# Patient Record
Sex: Male | Born: 1955
Health system: Southern US, Community
[De-identification: ages and names within clinical notes are randomized; demographics above are authoritative.]

## PROBLEM LIST (undated history)

## (undated) DIAGNOSIS — T7840XA Allergy, unspecified, initial encounter: Secondary | ICD-10-CM

## (undated) DIAGNOSIS — M1990 Unspecified osteoarthritis, unspecified site: Secondary | ICD-10-CM

## (undated) DIAGNOSIS — J329 Chronic sinusitis, unspecified: Secondary | ICD-10-CM

## (undated) DIAGNOSIS — F419 Anxiety disorder, unspecified: Secondary | ICD-10-CM

## (undated) DIAGNOSIS — H269 Unspecified cataract: Secondary | ICD-10-CM

## (undated) DIAGNOSIS — Z87442 Personal history of urinary calculi: Secondary | ICD-10-CM

## (undated) DIAGNOSIS — E039 Hypothyroidism, unspecified: Secondary | ICD-10-CM

## (undated) DIAGNOSIS — Z872 Personal history of diseases of the skin and subcutaneous tissue: Secondary | ICD-10-CM

## (undated) DIAGNOSIS — H409 Unspecified glaucoma: Secondary | ICD-10-CM

## (undated) DIAGNOSIS — K219 Gastro-esophageal reflux disease without esophagitis: Secondary | ICD-10-CM

## (undated) DIAGNOSIS — M6208 Separation of muscle (nontraumatic), other site: Secondary | ICD-10-CM

## (undated) DIAGNOSIS — K439 Ventral hernia without obstruction or gangrene: Secondary | ICD-10-CM

## (undated) HISTORY — DX: Separation of muscle (nontraumatic), other site: M62.08

## (undated) HISTORY — DX: Gastro-esophageal reflux disease without esophagitis: K21.9

## (undated) HISTORY — PX: OTHER SURGICAL HISTORY: SHX169

## (undated) HISTORY — DX: Unspecified glaucoma: H40.9

## (undated) HISTORY — DX: Chronic sinusitis, unspecified: J32.9

## (undated) HISTORY — DX: Allergy, unspecified, initial encounter: T78.40XA

## (undated) HISTORY — PX: JOINT REPLACEMENT: SHX530

## (undated) HISTORY — DX: Ventral hernia without obstruction or gangrene: K43.9

## (undated) HISTORY — PX: TONSILLECTOMY AND ADENOIDECTOMY: SHX28

## (undated) HISTORY — DX: Personal history of diseases of the skin and subcutaneous tissue: Z87.2

## (undated) HISTORY — PX: EYE SURGERY: SHX253

## (undated) HISTORY — DX: Personal history of urinary calculi: Z87.442

## (undated) HISTORY — DX: Unspecified cataract: H26.9

## (undated) HISTORY — DX: Hypothyroidism, unspecified: E03.9

## (undated) HISTORY — DX: Anxiety disorder, unspecified: F41.9

---

## 2003-06-19 ENCOUNTER — Encounter: Admission: RE | Admit: 2003-06-19 | Discharge: 2003-06-19 | Payer: Self-pay | Admitting: Internal Medicine

## 2004-12-06 ENCOUNTER — Ambulatory Visit: Payer: Self-pay | Admitting: Internal Medicine

## 2005-01-01 ENCOUNTER — Ambulatory Visit: Payer: Self-pay | Admitting: Cardiology

## 2005-01-01 ENCOUNTER — Ambulatory Visit: Payer: Self-pay | Admitting: Internal Medicine

## 2005-01-23 ENCOUNTER — Ambulatory Visit: Payer: Self-pay | Admitting: Internal Medicine

## 2005-03-24 ENCOUNTER — Ambulatory Visit: Payer: Self-pay | Admitting: Gastroenterology

## 2005-04-28 ENCOUNTER — Ambulatory Visit: Payer: Self-pay | Admitting: Gastroenterology

## 2006-08-22 ENCOUNTER — Ambulatory Visit: Payer: Self-pay | Admitting: Family Medicine

## 2006-09-08 ENCOUNTER — Ambulatory Visit: Payer: Self-pay | Admitting: Internal Medicine

## 2006-09-08 ENCOUNTER — Encounter: Admission: RE | Admit: 2006-09-08 | Discharge: 2006-09-08 | Payer: Self-pay | Admitting: Internal Medicine

## 2007-04-12 ENCOUNTER — Ambulatory Visit: Payer: Self-pay | Admitting: Internal Medicine

## 2007-04-12 LAB — CONVERTED CEMR LAB
AST: 26 units/L (ref 0–37)
Basophils Absolute: 0 10*3/uL (ref 0.0–0.1)
Basophils Relative: 0.5 % (ref 0.0–1.0)
Bilirubin, Direct: 0.1 mg/dL (ref 0.0–0.3)
CO2: 30 meq/L (ref 19–32)
Chloride: 108 meq/L (ref 96–112)
Cholesterol: 161 mg/dL (ref 0–200)
Creatinine, Ser: 1 mg/dL (ref 0.4–1.5)
Eosinophils Relative: 4.1 % (ref 0.0–5.0)
Glucose, Bld: 97 mg/dL (ref 70–99)
HCT: 46 % (ref 39.0–52.0)
HDL: 29.9 mg/dL — ABNORMAL LOW (ref >39.0)
Hemoglobin: 16.1 g/dL (ref 13.0–17.0)
Leukocytes, UA: NEGATIVE
MCHC: 35 g/dL (ref 30.0–36.0)
Monocytes Absolute: 0.5 10*3/uL (ref 0.2–0.7)
Neutrophils Relative %: 52.7 % (ref 43.0–77.0)
Nitrite: NEGATIVE
PSA: 0.28 ng/mL (ref 0.10–4.00)
Potassium: 4.4 meq/L (ref 3.5–5.1)
RDW: 12.7 % (ref 11.5–14.6)
Sodium: 143 meq/L (ref 135–145)
TSH: 1.8 microintl units/mL (ref 0.35–5.50)
Total Bilirubin: 0.4 mg/dL (ref 0.3–1.2)
Total Protein: 7.1 g/dL (ref 6.0–8.3)
Urobilinogen, UA: 0.2 (ref 0.0–1.0)
VLDL: 26 mg/dL (ref 0–40)
WBC: 6.2 10*3/uL (ref 4.5–10.5)
pH: 5.5 (ref 5.0–8.0)

## 2007-04-13 ENCOUNTER — Encounter: Payer: Self-pay | Admitting: Internal Medicine

## 2007-04-14 ENCOUNTER — Encounter: Payer: Self-pay | Admitting: *Deleted

## 2007-04-14 DIAGNOSIS — E039 Hypothyroidism, unspecified: Secondary | ICD-10-CM

## 2007-04-14 DIAGNOSIS — Z9089 Acquired absence of other organs: Secondary | ICD-10-CM | POA: Insufficient documentation

## 2007-04-14 DIAGNOSIS — Z872 Personal history of diseases of the skin and subcutaneous tissue: Secondary | ICD-10-CM | POA: Insufficient documentation

## 2007-04-14 DIAGNOSIS — Z87442 Personal history of urinary calculi: Secondary | ICD-10-CM

## 2007-04-15 ENCOUNTER — Ambulatory Visit: Payer: Self-pay | Admitting: Internal Medicine

## 2007-08-12 ENCOUNTER — Ambulatory Visit: Payer: Self-pay | Admitting: Internal Medicine

## 2007-08-12 LAB — CONVERTED CEMR LAB
Ketones, ur: NEGATIVE mg/dL
Leukocytes, UA: NEGATIVE
Urine Glucose: NEGATIVE mg/dL
Urobilinogen, UA: 0.2 (ref 0.0–1.0)

## 2007-12-03 ENCOUNTER — Encounter: Admission: RE | Admit: 2007-12-03 | Discharge: 2007-12-03 | Payer: Self-pay | Admitting: Orthopaedic Surgery

## 2007-12-15 ENCOUNTER — Emergency Department (HOSPITAL_COMMUNITY): Admission: EM | Admit: 2007-12-15 | Discharge: 2007-12-15 | Payer: Self-pay | Admitting: Emergency Medicine

## 2008-03-16 ENCOUNTER — Ambulatory Visit: Payer: Self-pay | Admitting: Internal Medicine

## 2008-03-29 ENCOUNTER — Telehealth: Payer: Self-pay | Admitting: Internal Medicine

## 2008-03-30 ENCOUNTER — Ambulatory Visit: Payer: Self-pay | Admitting: Internal Medicine

## 2008-03-30 DIAGNOSIS — H9190 Unspecified hearing loss, unspecified ear: Secondary | ICD-10-CM | POA: Insufficient documentation

## 2008-04-11 ENCOUNTER — Ambulatory Visit: Payer: Self-pay | Admitting: Internal Medicine

## 2008-04-11 LAB — CONVERTED CEMR LAB
ALT: 30 units/L (ref 0–53)
AST: 23 units/L (ref 0–37)
Alkaline Phosphatase: 92 units/L (ref 39–117)
Basophils Absolute: 0.1 10*3/uL (ref 0.0–0.1)
Bilirubin Urine: NEGATIVE
Bilirubin, Direct: 0.2 mg/dL (ref 0.0–0.3)
CO2: 29 meq/L (ref 19–32)
Chloride: 103 meq/L (ref 96–112)
Glucose, Bld: 99 mg/dL (ref 70–99)
Hemoglobin: 15.7 g/dL (ref 13.0–17.0)
Ketones, ur: NEGATIVE mg/dL
LDL Cholesterol: 103 mg/dL — ABNORMAL HIGH (ref 0–99)
Leukocytes, UA: NEGATIVE
Lymphocytes Relative: 23.1 % (ref 12.0–46.0)
MCHC: 34 g/dL (ref 30.0–36.0)
Monocytes Relative: 7.7 % (ref 3.0–12.0)
Neutrophils Relative %: 66.1 % (ref 43.0–77.0)
Nitrite: NEGATIVE
Potassium: 4.1 meq/L (ref 3.5–5.1)
RDW: 12.7 % (ref 11.5–14.6)
Sodium: 139 meq/L (ref 135–145)
Specific Gravity, Urine: 1.02 (ref 1.000–1.03)
TSH: 3.21 microintl units/mL (ref 0.35–5.50)
Total Bilirubin: 0.9 mg/dL (ref 0.3–1.2)
Total CHOL/HDL Ratio: 4.3
Total Protein: 7.4 g/dL (ref 6.0–8.3)
Triglycerides: 62 mg/dL (ref 0–149)
Urobilinogen, UA: 0.2 (ref 0.0–1.0)
pH: 6 (ref 5.0–8.0)

## 2008-04-17 ENCOUNTER — Encounter: Payer: Self-pay | Admitting: Internal Medicine

## 2008-04-18 ENCOUNTER — Ambulatory Visit: Payer: Self-pay | Admitting: Internal Medicine

## 2008-04-18 DIAGNOSIS — E669 Obesity, unspecified: Secondary | ICD-10-CM | POA: Insufficient documentation

## 2008-06-05 ENCOUNTER — Telehealth: Payer: Self-pay | Admitting: Internal Medicine

## 2008-10-29 ENCOUNTER — Telehealth: Payer: Self-pay | Admitting: Family Medicine

## 2009-03-01 ENCOUNTER — Ambulatory Visit: Payer: Self-pay | Admitting: Internal Medicine

## 2009-03-01 ENCOUNTER — Telehealth: Payer: Self-pay | Admitting: Internal Medicine

## 2009-03-01 DIAGNOSIS — M545 Low back pain: Secondary | ICD-10-CM

## 2009-03-12 ENCOUNTER — Telehealth: Payer: Self-pay | Admitting: Internal Medicine

## 2009-04-13 ENCOUNTER — Ambulatory Visit: Payer: Self-pay | Admitting: Internal Medicine

## 2009-04-13 LAB — CONVERTED CEMR LAB
AST: 23 units/L (ref 0–37)
Albumin: 3.9 g/dL (ref 3.5–5.2)
Alkaline Phosphatase: 101 units/L (ref 39–117)
BUN: 14 mg/dL (ref 6–23)
Basophils Absolute: 0.1 10*3/uL (ref 0.0–0.1)
CO2: 28 meq/L (ref 19–32)
Chloride: 107 meq/L (ref 96–112)
Creatinine, Ser: 1 mg/dL (ref 0.4–1.5)
Eosinophils Relative: 5 % (ref 0.0–5.0)
Glucose, Bld: 91 mg/dL (ref 70–99)
Hemoglobin: 15.6 g/dL (ref 13.0–17.0)
LDL Cholesterol: 94 mg/dL (ref 0–99)
Lymphocytes Relative: 35.5 % (ref 12.0–46.0)
Monocytes Relative: 10.2 % (ref 3.0–12.0)
Neutro Abs: 2.7 10*3/uL (ref 1.4–7.7)
RDW: 12.8 % (ref 11.5–14.6)
Specific Gravity, Urine: 1.01 (ref 1.000–1.030)
Total Bilirubin: 0.5 mg/dL (ref 0.3–1.2)
Total CHOL/HDL Ratio: 4
Total Protein, Urine: NEGATIVE mg/dL
Triglycerides: 87 mg/dL (ref 0.0–149.0)
Urine Glucose: NEGATIVE mg/dL
WBC: 5.7 10*3/uL (ref 4.5–10.5)

## 2009-04-20 ENCOUNTER — Ambulatory Visit: Payer: Self-pay | Admitting: Internal Medicine

## 2009-06-26 ENCOUNTER — Ambulatory Visit: Payer: Self-pay | Admitting: Internal Medicine

## 2009-07-16 ENCOUNTER — Ambulatory Visit: Payer: Self-pay | Admitting: Internal Medicine

## 2009-07-16 ENCOUNTER — Encounter (INDEPENDENT_AMBULATORY_CARE_PROVIDER_SITE_OTHER): Payer: Self-pay | Admitting: *Deleted

## 2009-07-16 DIAGNOSIS — K219 Gastro-esophageal reflux disease without esophagitis: Secondary | ICD-10-CM | POA: Insufficient documentation

## 2009-07-16 DIAGNOSIS — K921 Melena: Secondary | ICD-10-CM

## 2009-07-16 LAB — CONVERTED CEMR LAB
AST: 27 units/L (ref 0–37)
Albumin: 4.1 g/dL (ref 3.5–5.2)
Basophils Absolute: 0 10*3/uL (ref 0.0–0.1)
Bilirubin Urine: NEGATIVE
CO2: 28 meq/L (ref 19–32)
Chloride: 108 meq/L (ref 96–112)
Eosinophils Absolute: 0.1 10*3/uL (ref 0.0–0.7)
Glucose, Bld: 91 mg/dL (ref 70–99)
HCT: 46 % (ref 39.0–52.0)
Hemoglobin, Urine: NEGATIVE
Hemoglobin: 15.6 g/dL (ref 13.0–17.0)
Lymphs Abs: 1.8 10*3/uL (ref 0.7–4.0)
MCHC: 33.8 g/dL (ref 30.0–36.0)
Monocytes Relative: 9.3 % (ref 3.0–12.0)
Neutro Abs: 3.2 10*3/uL (ref 1.4–7.7)
Nitrite: NEGATIVE
Potassium: 4 meq/L (ref 3.5–5.1)
RDW: 12.9 % (ref 11.5–14.6)
Sodium: 141 meq/L (ref 135–145)
TSH: 2.79 microintl units/mL (ref 0.35–5.50)
Total Protein, Urine: NEGATIVE mg/dL

## 2009-07-17 ENCOUNTER — Encounter: Payer: Self-pay | Admitting: Internal Medicine

## 2009-08-14 ENCOUNTER — Encounter (INDEPENDENT_AMBULATORY_CARE_PROVIDER_SITE_OTHER): Payer: Self-pay | Admitting: *Deleted

## 2009-08-14 ENCOUNTER — Ambulatory Visit: Payer: Self-pay | Admitting: Gastroenterology

## 2009-08-14 DIAGNOSIS — H409 Unspecified glaucoma: Secondary | ICD-10-CM

## 2009-08-14 DIAGNOSIS — R1031 Right lower quadrant pain: Secondary | ICD-10-CM

## 2009-08-14 DIAGNOSIS — R1084 Generalized abdominal pain: Secondary | ICD-10-CM

## 2009-08-14 LAB — CONVERTED CEMR LAB
Ketones, ur: NEGATIVE mg/dL
Leukocytes, UA: NEGATIVE
Nitrite: NEGATIVE
Specific Gravity, Urine: 1.025 (ref 1.000–1.030)
pH: 6 (ref 5.0–8.0)

## 2009-08-16 ENCOUNTER — Ambulatory Visit (HOSPITAL_COMMUNITY): Admission: RE | Admit: 2009-08-16 | Discharge: 2009-08-16 | Payer: Self-pay | Admitting: Gastroenterology

## 2009-08-22 ENCOUNTER — Telehealth: Payer: Self-pay | Admitting: Gastroenterology

## 2009-09-08 ENCOUNTER — Telehealth: Payer: Self-pay | Admitting: Internal Medicine

## 2009-09-10 ENCOUNTER — Ambulatory Visit: Payer: Self-pay | Admitting: Gastroenterology

## 2009-09-10 LAB — HM COLONOSCOPY

## 2009-09-11 ENCOUNTER — Encounter (INDEPENDENT_AMBULATORY_CARE_PROVIDER_SITE_OTHER): Payer: Self-pay | Admitting: *Deleted

## 2009-09-12 ENCOUNTER — Ambulatory Visit: Payer: Self-pay | Admitting: Gastroenterology

## 2009-09-14 ENCOUNTER — Ambulatory Visit: Payer: Self-pay | Admitting: Gastroenterology

## 2009-09-14 LAB — CONVERTED CEMR LAB: UREASE: NEGATIVE

## 2009-09-18 ENCOUNTER — Encounter: Payer: Self-pay | Admitting: Gastroenterology

## 2009-09-19 ENCOUNTER — Telehealth (INDEPENDENT_AMBULATORY_CARE_PROVIDER_SITE_OTHER): Payer: Self-pay | Admitting: *Deleted

## 2009-09-20 ENCOUNTER — Encounter (INDEPENDENT_AMBULATORY_CARE_PROVIDER_SITE_OTHER): Payer: Self-pay | Admitting: *Deleted

## 2009-11-05 ENCOUNTER — Telehealth: Payer: Self-pay | Admitting: Gastroenterology

## 2009-11-29 ENCOUNTER — Telehealth: Payer: Self-pay | Admitting: Gastroenterology

## 2009-12-31 ENCOUNTER — Ambulatory Visit: Payer: Self-pay | Admitting: Gastroenterology

## 2010-03-05 ENCOUNTER — Ambulatory Visit: Payer: Self-pay | Admitting: Internal Medicine

## 2010-06-11 NOTE — Progress Notes (Signed)
Summary: Ultrasound results   Phone Note Call from Patient Call back at Work Phone [phone]   Caller: Patient Call For: Dr. Jarold Motto Reason for Call: Lab or Test Results Summary of Call: Pt calling about ultrasound results Initial call taken by: Karna Christmas,  August 22, 2009 10:59 AM  Follow-up for Phone Call        Discussed ultrasound results with pt. Follow-up by: Ashok Cordia RN,  August 22, 2009 11:32 AM

## 2010-06-11 NOTE — Progress Notes (Signed)
Summary: Omeprazole   Phone Note Call from Patient Call back at [phone]   Caller: wife, Darl Pikes Call For: Dr. Jarold Motto Reason for Call: Talk to Nurse Summary of Call: wants to verify that pt has switched from Omeprazole from Aciphex... also wants to know if Omeprazole, 34 count, has been called in to [facility] on [address]... please call Darl Pikes and let her know Initial call taken by: Vallarie Mare,  November 29, 2009 10:09 AM  Follow-up for Phone Call        LM for wife to call.  Lupita Leash Surface RN  November 29, 2009 11:49 AM  Wife asks that we sent Rx to [facility] on [address]. Rx sent as requested. Follow-up by: Ashok Cordia RN,  November 29, 2009 12:13 PM    Prescriptions: OMEPRAZOLE 20 MG  CPDR (OMEPRAZOLE) 1 each day 30 minutes before meal  #34 x 6   Entered by:   Ashok Cordia RN   Authorized by:   Mardella Layman MD [facility]   Signed by:   Ashok Cordia RN on 11/29/2009   Method used:   Electronically to        [facility] [address] [facility]* (retail)       [address]       [place], [place]  [address]       Ph: [phone] or [phone]       Fax: [phone]   RxID:   [id number]

## 2010-06-11 NOTE — Letter (Signed)
Summary: [facility] Instructions  [facility]  [address] [place], [place] [address]   Phone: [phone]  Fax: [phone]       Jay Holmes    [birth year]    MRN: [id number]        Procedure Day /Date: Monday, 09/10/09     Arrival Time: 12:30      Procedure Time: 1:30     Location of Procedure:                    Jay Holmes  [facility] (4th Floor)   PREPARATION FOR COLONOSCOPY WITH MOVIPREP   Starting 5 days prior to your procedure 09/05/09  do not eat nuts, seeds, popcorn, corn, beans, peas,  salads, or any raw vegetables.  Do not take any fiber supplements (e.g. Metamucil, Citrucel, and Benefiber).  THE DAY BEFORE YOUR PROCEDURE         DATE: 09/09/09   DAY: Sunday  1.  Drink clear liquids the entire day-NO SOLID FOOD  2.  Do not drink anything colored red or purple.  Avoid juices with pulp.  No orange juice.  3.  Drink at least 64 oz. (8 glasses) of fluid/clear liquids during the day to prevent dehydration and help the prep work efficiently.  CLEAR LIQUIDS INCLUDE: Water Jello Ice Popsicles Tea (sugar ok, no milk/cream) Powdered fruit flavored drinks Coffee (sugar ok, no milk/cream) Gatorade Juice: apple, white grape, white cranberry  Lemonade Clear bullion, consomm, broth Carbonated beverages (any kind) Strained chicken noodle soup Hard Candy                             4.  In the morning, mix first dose of MoviPrep solution:    Empty 1 Pouch A and 1 Pouch B into the disposable container    Add lukewarm drinking water to the top line of the container. Mix to dissolve    Refrigerate (mixed solution should be used within 24 hrs)  5.  Begin drinking the prep at 5:00 p.m. The MoviPrep container is divided by 4 marks.   Every 15 minutes drink the solution down to the next mark (approximately 8 oz) until the full liter is complete.   6.  Follow completed prep with 16 oz of clear liquid of your choice (Nothing red or purple).  Continue to  drink clear liquids until bedtime.  7.  Before going to bed, mix second dose of MoviPrep solution:    Empty 1 Pouch A and 1 Pouch B into the disposable container    Add lukewarm drinking water to the top line of the container. Mix to dissolve    Refrigerate  THE DAY OF YOUR PROCEDURE      DATE: 09/10/09   DAY: Monday  Beginning at 8:30 a.m. (5 hours before procedure):         1. Every 15 minutes, drink the solution down to the next mark (approx 8 oz) until the full liter is complete.  2. Follow completed prep with 16 oz. of clear liquid of your choice.    3. You may drink clear liquids until 11:30  (2 HOURS BEFORE PROCEDURE).   MEDICATION INSTRUCTIONS  Unless otherwise instructed, you should take regular prescription medications with a small sip of water   as early as possible the morning of your procedure.  OTHER INSTRUCTIONS  You will need a responsible adult at least 56 years of age to accompany you and drive you home.   This person must remain in the waiting room during your procedure.  Wear loose fitting clothing that is easily removed.  Leave jewelry and other valuables at home.  However, you may wish to bring a book to read or  an iPod/MP3 player to listen to music as you wait for your procedure to start.  Remove all body piercing jewelry and leave at home.  Total time from sign-in until discharge is approximately 2-3 hours.  You should go home directly after your procedure and rest.  You can resume normal activities the  day after your procedure.  The day of your procedure you should not:   Drive   Make legal decisions   Operate machinery   Drink alcohol   Return to work  You will receive specific instructions about eating, activities and medications before you leave.    The above instructions have been reviewed and explained to me by   _______________________    I fully understand and can verbalize these instructions  _____________________________ Date _________

## 2010-06-11 NOTE — Procedures (Signed)
Summary: Upper Endoscopy  Patient: Gloria Lambertson Note: All result statuses are Final unless otherwise noted.  Tests: (1) Upper Endoscopy (EGD)   EGD Upper Endoscopy       DONE     [facility]     [address]     [place], [place]  [address]           ENDOSCOPY PROCEDURE REPORT           PATIENT:  Archer, Moist  MR#:  [id number]     BIRTHDATE:  October 16, 1955, 54 yrs. old  GENDER:  male           ENDOSCOPIST:  Vania Rea. Jarold Motto, MD, [facility]     Referred by:  Rosalyn Gess. Norins, M.D.           PROCEDURE DATE:  09/14/2009     PROCEDURE:  EGD with biopsy     ASA CLASS:  Class II     INDICATIONS:  heartburn, FOBT + stool           MEDICATIONS:   Fentanyl 50 mcg IV, Versed 6 mg IV     TOPICAL ANESTHETIC:  Exactacain Spray           DESCRIPTION OF PROCEDURE:   After the risks benefits and     alternatives of the procedure were thoroughly explained, informed     consent was obtained.  The LB GIF-H180 [id number] endoscope was     introduced through the mouth and advanced to the second portion of     the duodenum, without limitations.  The instrument was slowly     withdrawn as the mucosa was fully examined.     <<PROCEDUREIMAGES>>           A hiatal hernia was found. 4-5 cm. HH NOTED.GE JUNCTION BIOPSIED.     Retroflexed views revealed a hiatal hernia.    The scope was then     withdrawn from the patient and the procedure completed.           COMPLICATIONS:  None           ENDOSCOPIC IMPRESSION:     1) Hiatal hernia     PROBABLE CHRONIC GERD.R/O BARRETT'S MUCOSA.     RECOMMENDATIONS:     1) Await biopsy results     2) Anti-reflux regimen to be follow     3) continue current medications           REPEAT EXAM:  No           ______________________________     Vania Rea. Jarold Motto, MD, Clement[facility] Graham           CC:  Etta Grandchild, MD           n.     Rosalie DoctorMarland Kitchen   Vania Rea. Rona Tomson at 09/14/2009 04:00 PM           Jahbari, Repinski, [id number]  Note: An exclamation mark (!) indicates a  result that was not dispersed into the flowsheet. Document Creation Date: 09/14/2009 4:01 PM _______________________________________________________________________  (1) Order result status: Final Collection or observation date-time: 09/14/2009 15:50 Requested date-time:  Receipt date-time:  Reported date-time:  Referring Physician:   Ordering Physician: Sheryn Bison [phone]) Specimen Source:  Source: Launa Grill Order Number: [id number] Lab site:

## 2010-06-11 NOTE — Assessment & Plan Note (Signed)
Summary: ?hernia/#/cd   Vital Signs:  Patient profile:   55 year old male Height:      67 inches Weight:      198 pounds BMI:     31.12 O2 Sat:      97 % on Room air Temp:     98.2 degrees F oral Pulse rate:   73 / minute BP sitting:   118 / 72  (left arm) Cuff size:   regular  Vitals Entered By: Bill Salinas CMA (March 05, 2010 4:16 PM)  O2 Flow:  Room air CC: pt here for evaluation of what he describes as a jolting pain in his right side when he tries to lift something. / ab   Primary Care Provider:  Jacques Navy, MD   CC:  pt here for evaluation of what he describes as a jolting pain in his right side when he tries to lift something. / ab.  History of Present Illness: Presents at the suggestion of his wife for evaluation of abdominal pain and question of "hernia." chart reviewed - he did have EGD which revealed a 4-5 cm hiatal hernia and esophagitis. His biopsy was negative for Barrett's. He was started on PPI therapy and has had no problems.  He reports that with heavy lifting or manual labor he has been having pain in the RLQ abdominal wall. He has felt no bulge or defect.   Current Medications (verified): 1)  Synthroid 100 Mcg Tabs (Levothyroxine Sodium) .[name].. 1 By Mouth Once Daily 2)  Omeprazole 20 Mg  Cpdr (Omeprazole) .[name].. 1 Each Day 30 Minutes Before Meal 3)  Lumigan 0.03 % Soln (Bimatoprost) .[name].. 1 Drop Both Eyes Daily 4)  Glucosamine 1500 Complex  Caps (Glucosamine-Chondroit-Vit C-Mn) .[name].. 1 Capsule Once A Day 5)  Fish Oil 300 Mg Caps (Omega-3 Fatty Acids) .[name].. 1 Capsule Once A Day 6)  Vitamin C 1000 Mg Tabs (Ascorbic Acid) .[name].. 1 Tablet Once A Day  Allergies (verified): No Known Drug Allergies  Past History:  Past Medical History: Last updated: 04/14/2007 ALOPECIA, HX OF (ICD-V13.3) Hx of SINUSITIS, RECURRENT (ICD-473.9) NEPHROLITHIASIS, HX OF (ICD-V13.01) HYPOTHYROIDISM (ICD-244.9)    Past Surgical History: Last updated: 04/14/2007 TONSILLECTOMY  AND ADENOIDECTOMY, HX OF (ICD-V45.79) PSH reviewed for relevance, FH reviewed for relevance  Review of Systems  The patient denies anorexia, fever, weight loss, weight gain, abdominal pain, melena, hematochezia, severe indigestion/heartburn, muscle weakness, difficulty walking, depression, abnormal bleeding, and enlarged lymph nodes.    Physical Exam  General:  moderately overweight white male in no distress Head:  normocephalic and atraumatic.   Eyes:  C&S clear Neck:  supple and full ROM.   Lungs:  normal respiratory effort.   Heart:  normal rate and regular rhythm.   Abdomen:  soft, non-tender, no distention, and no masses.  No palpable hernia abdominal wall RLQ standing with valsalva or supine with leg raise.    Impression & Recommendations:  Problem # 1:  ABDOMINAL PAIN-RLQ (ICD-789.03) no evidence of hernia. Suspect minor muscle strain. Explained the difference between abdominal hernia and hiatal hernia and that they are not connected.   Complete Medication List: 1)  Synthroid 100 Mcg Tabs (Levothyroxine sodium) .[name].. 1 by mouth once daily 2)  Omeprazole 20 Mg Cpdr (Omeprazole) .[name].. 1 each day 30 minutes before meal 3)  Lumigan 0.03 % Soln (Bimatoprost) .[name].. 1 drop both eyes daily 4)  Glucosamine 1500 Complex Caps (Glucosamine-chondroit-vit c-mn) .[name].. 1 capsule once a day 5)  Fish Oil 300  Mg Caps (Omega-3 fatty acids) .[name].. 1 capsule once a day 6)  Vitamin C 1000 Mg Tabs (Ascorbic acid) .[name].. 1 tablet once a day   Orders Added: 1)  Est. Patient Level III [[address]]   Immunization History:  Influenza Immunization History:    Influenza:  historical (02/03/2010)   Immunization History:  Influenza Immunization History:    Influenza:  Historical (02/03/2010)

## 2010-06-11 NOTE — Miscellaneous (Signed)
Summary: [facility] PV  Clinical Lists Changes  Observations: Added new observation of NKA: T (09/12/2009 8:02)

## 2010-06-11 NOTE — Miscellaneous (Signed)
Summary: Aciphex Rx  Clinical Lists Changes  Medications: Added new medication of ACIPHEX 20 MG  TBEC (RABEPRAZOLE SODIUM) Take 1 each day 30 minutes before meals - Signed Rx of ACIPHEX 20 MG  TBEC (RABEPRAZOLE SODIUM) Take 1 each day 30 minutes before meals;  #30 x 10;  Signed;  Entered by: Doristine Church RN II;  Authorized by: Mardella Layman MD [facility];  Method used: Electronically to [facility]. #[id number]*, [address], [place], [place], [place]  [address], Ph: [phone], Fax: [phone] Observations: Added new observation of ALLERGY REV: Done (09/14/2009 16:35) Added new observation of NKA: T (09/14/2009 16:35)    Prescriptions: ACIPHEX 20 MG  TBEC (RABEPRAZOLE SODIUM) Take 1 each day 30 minutes before meals  #30 x 10   Entered by:   Doristine Church RN II   Authorized by:   Mardella Layman MD [facility]   Signed by:   Doristine Church RN II on 09/14/2009   Method used:   Electronically to        [facility]. [phone]* (retail)       [address]       [place], [place]  [address]       Ph: [phone]       Fax: [phone]   RxID:   [id number]

## 2010-06-11 NOTE — Progress Notes (Signed)
Summary: med problem  Medications Added OMEPRAZOLE 20 MG  CPDR (OMEPRAZOLE) 1 each day 30 minutes before meal       Phone Note Call from Patient Call back at [phone]   Caller: spouse Darl Pikes  Call For: Jay Holmes Reason for Call: Refill Medication Summary of Call: Patient is having problems with his Aciphex wants to know if theres something else that can be called in. Initial call taken by: Tawni Levy,  November 05, 2009 9:56 AM  Follow-up for Phone Call        WIfe states Aciphex has a $50 copay and if med can be changed to either omeprazole, pantoprazole or zegerid, copay will only be $7.  Req med change.  Needs 34 day supply sent to [facility] on [address] [facility]. Follow-up by: Ashok Cordia RN,  November 05, 2009 10:13 AM  Additional Follow-up for Phone Call Additional follow up Details #1::        Rx sent as requested. Additional Follow-up by: Ashok Cordia RN,  November 05, 2009 10:15 AM    New/Updated Medications: OMEPRAZOLE 20 MG  CPDR (OMEPRAZOLE) 1 each day 30 minutes before meal Prescriptions: OMEPRAZOLE 20 MG  CPDR (OMEPRAZOLE) 1 each day 30 minutes before meal  #34 x 6   Entered by:   Ashok Cordia RN   Authorized by:   Mardella Layman MD [facility]   Signed by:   Ashok Cordia RN on 11/05/2009   Method used:   Electronically to        [id number]* (retail)       [address]       [place], [place]  [address]       Ph: [phone]       Fax: [phone]   RxID:   [id number]

## 2010-06-11 NOTE — Letter (Signed)
Summary: New Patient letter  [facility]  [address] [place], [place] [address]   Phone: [phone]  Fax: [phone]       07/16/2009 MRN: [id number]  Jay Holmes [address] [place], [place]  [address]  Dear Mr. SADOWSKI,  Welcome to the Gastroenterology Division at [facility]    You are scheduled to see Dr. Jarold Motto on 08/14/2009 at 9:00AM on the 3rd floor at [facility], [address]  We ask that you try to arrive at our office 15 minutes prior to your appointment time to allow for check-in.  We would like you to complete the enclosed self-administered evaluation form prior to your visit and bring it with you on the day of your appointment.  We will review it with you.  Also, please bring a complete list of all your medications or, if you prefer, bring the medication bottles and we will list them.  Please bring your insurance card so that we may make a copy of it.  If your insurance requires a referral to see a specialist, please bring your referral form from your primary care physician.  Co-payments are due at the time of your visit and may be paid by cash, check or credit card.     Your office visit will consist of a consult with your physician (includes a physical exam), any laboratory testing he/she may order, scheduling of any necessary diagnostic testing (e.g. x-ray, ultrasound, CT-scan), and scheduling of a procedure (e.g. Endoscopy, Colonoscopy) if required.  Please allow enough time on your schedule to allow for any/all of these possibilities.    If you cannot keep your appointment, please call [phone] to cancel or reschedule prior to your appointment date.  This allows [place] the opportunity to schedule an appointment for another patient in need of care.  If you do not cancel or reschedule by 5 p.m. the business day prior to your appointment date, you will be charged a $50.00 late cancellation/no-show fee.    Thank you for choosing  [facility] for your medical needs.  We appreciate the opportunity to care for you.  Please visit [place] at our website  to learn more about our practice.                     Sincerely,                                                             The Gastroenterology Division

## 2010-06-11 NOTE — Miscellaneous (Signed)
Summary: CLOTEST  Clinical Lists Changes  Orders: Added new Test order of TLB-H Pylori Screen Gastric Biopsy (83013-CLOTEST) - Signed 

## 2010-06-11 NOTE — Letter (Signed)
Summary: EGD Instructions  [facility]  [address] [place], [place] [address]   Phone: [phone]  Fax: [phone]       Jay Holmes    [birth year]    MRN: [id number]       Procedure Day Dorna Bloom: [name]  09/14/09     Arrival Time:  2:30pm     Procedure Time: 3:30pm     Location of Procedure:                    _ X _ [facility] (4th Floor)   PREPARATION FOR ENDOSCOPY   On FRIDAY 05/06 ,  THE DAY OF THE PROCEDURE:  1.   No solid foods, milk or milk products are allowed after midnight the night before your procedure.  2.   Do not drink anything colored red or purple.  Avoid juices with pulp.  No orange juice.  3.  You may drink clear liquids until 1:30pm  which is 2 hours before your procedure.                                                                                                CLEAR LIQUIDS INCLUDE: Water Jello Ice Popsicles Tea (sugar ok, no milk/cream) Powdered fruit flavored drinks Coffee (sugar ok, no milk/cream) Gatorade Juice: apple, white grape, white cranberry  Lemonade Clear bullion, consomm, broth Carbonated beverages (any kind) Strained chicken noodle soup Hard Candy   MEDICATION INSTRUCTIONS  Unless otherwise instructed, you should take regular prescription medications with a small sip of water as early as possible the morning of your procedure.               OTHER INSTRUCTIONS  You will need a responsible adult at least 56 years of age to accompany you and drive you home.   This person must remain in the waiting room during your procedure.  Wear loose fitting clothing that is easily removed.  Leave jewelry and other valuables at home.  However, you may wish to bring a book to read or an iPod/MP3 player to listen to music as you wait for your procedure to start.  Remove all body piercing jewelry and leave at home.  Total time from sign-in until discharge is approximately 2-3 hours.  You should go  home directly after your procedure and rest.  You can resume normal activities the day after your procedure.  The day of your procedure you should not:   Drive   Make legal decisions   Operate machinery   Drink alcohol   Return to work  You will receive specific instructions about eating, activities and medications before you leave.    The above instructions have been reviewed and explained to me by   Ezra Sites RN  Sep 12, 2009 8:22 AM     I fully understand and can verbalize these instructions _____________________________ Date _________

## 2010-06-11 NOTE — Assessment & Plan Note (Signed)
Summary: STOMACH PAIN/ ULCER? APPENDICITIS? /NWS   Vital Signs:  Patient profile:   55 year old male Height:      67 inches Weight:      199 pounds BMI:     31.28 O2 Sat:      95 % on Room air Temp:     97.9 degrees F oral Pulse rate:   91 / minute Pulse rhythm:   regular Resp:     16 per minute BP sitting:   122 / 80  (left arm) Cuff size:   large  Vitals Entered By: Rock Nephew CMA (July 16, 2009 9:38 AM)  Nutrition Counseling: Patient's BMI is greater than 25 and therefore counseled on weight management options.  O2 Flow:  Room air CC: Right side abdominal pain x 07/14/09, sharp w/activity, Abdominal pain Is Patient Diabetic? No Pain Assessment Patient in pain? yes     Location: abdomen   Primary Care Provider:  Norins  CC:  Right side abdominal pain x 07/14/09, sharp w/activity, and Abdominal pain.  History of Present Illness:  Abdominal Pain      This is a 55 year old man who presents with Abdominal pain.  The symptoms began 2 days ago.  On a scale of 1 to 10, the intensity is described as a 2.  The patient denies nausea, vomiting, diarrhea, constipation, melena, hematochezia, anorexia, and hematemesis.  The location of the pain is right upper quadrant and suprapubic.  The pain is described as intermittent and dull.  The patient denies the following symptoms: fever, dysuria, chest pain, jaundice, and dark urine.  The pain is worse with movement.    Clinical Review Panels:  Prevention   Last Colonoscopy:  Normal (04/22/2005)   Last PSA:  0.32 (04/13/2009)   Current Medications (verified): 1)  Synthroid 100 Mcg Tabs (Levothyroxine Sodium) .[name].. 1 By Mouth Once Daily  Allergies (verified): No Known Drug Allergies  Past History:  Past Medical History: Reviewed history from 04/14/2007 and no changes required. ALOPECIA, HX OF (ICD-V13.3) Hx of SINUSITIS, RECURRENT (ICD-473.9) NEPHROLITHIASIS, HX OF (ICD-V13.01) HYPOTHYROIDISM (ICD-244.9)    Past Surgical  History: Reviewed history from 04/14/2007 and no changes required. TONSILLECTOMY AND ADENOIDECTOMY, HX OF (ICD-V45.79)  Family History: Reviewed history from 04/20/2009 and no changes required. father- 53,  prostate cancer, DM-diet controlled mother- @ 7, memory loss, HTN, lipid, breast cancer -cause of death sister - healthy Neg-colon,cancer, CAD, CVA  Social History: Reviewed history from 04/15/2007 and no changes required. [place] BA-acctng married - 1998 1-daughter 1999, 1 son 2003 (cleft lip/palate) wife is healthy work: Psychologist, educational at [employer]  Review of Systems       The patient complains of weight loss, abdominal pain, and severe indigestion/heartburn.  The patient denies anorexia, fever, weight gain, chest pain, prolonged cough, headaches, hemoptysis, melena, hematochezia, hematuria, incontinence, genital sores, suspicious skin lesions, enlarged lymph nodes, and testicular masses.   GI:  Complains of abdominal pain and indigestion; denies bloody stools, change in bowel habits, constipation, dark tarry stools, diarrhea, excessive appetite, gas, hemorrhoids, loss of appetite, nausea, vomiting, vomiting blood, and yellowish skin color. GU:  Denies dysuria, hematuria, incontinence, nocturia, urinary frequency, and urinary hesitancy. Endo:  Denies cold intolerance, excessive hunger, excessive thirst, excessive urination, heat intolerance, and polyuria.  Physical Exam  General:  alert, well-developed, well-nourished, well-hydrated, normal appearance, healthy-appearing, cooperative to examination, good hygiene, and overweight-appearing.   Head:  normocephalic, atraumatic, no abnormalities observed, and no abnormalities palpated.   Eyes:  vision grossly intact, pupils equal, pupils round, and pupils reactive to light.   Ears:  R ear normal and L ear normal.   Mouth:  Oral mucosa and oropharynx without lesions or exudates.  Teeth in good repair. Neck:  supple, full ROM, no  masses, no thyromegaly, no JVD, normal carotid upstroke, and no carotid bruits.   Lungs:  normal respiratory effort, no intercostal retractions, no accessory muscle use, normal breath sounds, no dullness, no fremitus, no crackles, and no wheezes.   Heart:  normal rate, regular rhythm, no murmur, no gallop, no rub, and no JVD.   Abdomen:  bowel sounds hypoactive and he has a ventral abd. hernia that is present only with valsalva. soft, no distention, no masses, no guarding, no rigidity, no rebound tenderness, no inguinal hernia, no hepatomegaly, no splenomegaly, bowel sounds hypoactive, and RLQ tenderness.   Rectal:  no external abnormalities, no hemorrhoids, normal sphincter tone, no masses, no tenderness, no fissures, no fistulae, no perianal rash, and stool positive for occult blood.   Genitalia:  circumcised, no hydrocele, no varicocele, no scrotal masses, no testicular masses or atrophy, no cutaneous lesions, and no urethral discharge.   Prostate:  no gland enlargement, no nodules, no asymmetry, and no induration.   Msk:  normal ROM, no joint tenderness, no joint swelling, no joint warmth, no redness over joints, no joint deformities, no joint instability, no crepitation, and no muscle atrophy.   Pulses:  R and L carotid,radial,femoral,dorsalis pedis and posterior tibial pulses are full and equal bilaterally Extremities:  No clubbing, cyanosis, edema, or deformity noted with normal full range of motion of all joints.   Neurologic:  No cranial nerve deficits noted. Station and gait are normal. Plantar reflexes are down-going bilaterally. DTRs are symmetrical throughout. Sensory, motor and coordinative functions appear intact. Skin:  Intact without suspicious lesions or rashes Cervical Nodes:  no anterior cervical adenopathy and no posterior cervical adenopathy.   Axillary Nodes:  no R axillary adenopathy and no L axillary adenopathy.   Inguinal Nodes:  no R inguinal adenopathy and no L inguinal  adenopathy.   Psych:  Cognition and judgment appear intact. Alert and cooperative with normal attention span and concentration. No apparent delusions, illusions, hallucinations   Impression & Recommendations:  Problem # 1:  ABDOMINAL PAIN, SUPRAPUBIC (ICD-789.09) Assessment New this sounds and looks like UGI pathology but the location of the pain and the hypoactive BS's has led me to do an extensive work-up for other causes. Orders: Venipuncture (57846) TLB-BMP (Basic Metabolic Panel-BMET) (80048-METABOL) TLB-CBC Platelet - w/Differential (85025-CBCD) TLB-Hepatic/Liver Function Pnl (80076-HEPATIC) TLB-TSH (Thyroid Stimulating Hormone) (84443-TSH) TLB-Amylase (82150-AMYL) TLB-Lipase (83690-LIPASE) TLB-Udip w/ Micro (81001-URINE) T-Abdomen 2-view (74020TC) Hemoccult Guaiac-1 spec.(in office) (96295) Gastroenterology Referral (GI)  Discussed use of medications, application of heat or cold, and exercises.   Problem # 2:  BLOOD IN STOOL (ICD-578.1) Assessment: New  Orders: Venipuncture (28413) TLB-BMP (Basic Metabolic Panel-BMET) (80048-METABOL) TLB-CBC Platelet - w/Differential (85025-CBCD) TLB-Hepatic/Liver Function Pnl (80076-HEPATIC) TLB-TSH (Thyroid Stimulating Hormone) (84443-TSH) TLB-Amylase (82150-AMYL) TLB-Lipase (83690-LIPASE) TLB-Udip w/ Micro (81001-URINE) Hemoccult Guaiac-1 spec.(in office) (24401) Gastroenterology Referral (GI)  Problem # 3:  HYPOTHYROIDISM (ICD-244.9) Assessment: Unchanged  His updated medication list for this problem includes:    Synthroid 100 Mcg Tabs (Levothyroxine sodium) .[name]... 1 by mouth once daily  Orders: Venipuncture (02725) TLB-BMP (Basic Metabolic Panel-BMET) (80048-METABOL) TLB-CBC Platelet - w/Differential (85025-CBCD) TLB-Hepatic/Liver Function Pnl (80076-HEPATIC) TLB-TSH (Thyroid Stimulating Hormone) (84443-TSH) TLB-Amylase (82150-AMYL) TLB-Lipase (83690-LIPASE) TLB-Udip w/ Micro (81001-URINE)  Labs Reviewed: TSH: 3.26  (  04/13/2009)    Chol: 148 (04/13/2009)   HDL: 36.40 (04/13/2009)   LDL: 94 (04/13/2009)   TG: 87.0 (04/13/2009)  Problem # 4:  GERD (ICD-530.81) Assessment: New  His updated medication list for this problem includes:    Aciphex 20 Mg Tbec (Rabeprazole sodium) ..... One by mouth once daily for heartburn  Labs Reviewed: Hgb: 15.6 (04/13/2009)   Hct: 46.1 (04/13/2009)  Orders: Hemoccult Guaiac-1 spec.(in office) (81191) Gastroenterology Referral (GI)  Complete Medication List: 1)  Synthroid 100 Mcg Tabs (Levothyroxine sodium) .[name].. 1 by mouth once daily 2)  Aciphex 20 Mg Tbec (Rabeprazole sodium) .... One by mouth once daily for heartburn  Patient Instructions: 1)  Please schedule a follow-up appointment in 1 month. 2)  Avoid foods high in acid (tomatoes, citrus juices, spicy foods). Avoid eating within two hours of lying down or before exercising. Do not over eat; try smaller more frequent meals. Elevate head of bed twelve inches when sleeping. Prescriptions: ACIPHEX 20 MG TBEC (RABEPRAZOLE SODIUM) One by mouth once daily for heartburn  #15 x 0   Entered and Authorized by:   Etta Grandchild MD   Signed by:   Etta Grandchild MD on 07/16/2009   Method used:   Samples Given   RxID:   [id number]

## 2010-06-11 NOTE — Letter (Signed)
Summary: Results Follow-up Letter  [facility]  [address] [place], [place] [address]   Phone: [phone]  Fax: [phone]    07/17/2009  [address] [place], [place]  [address]  Dear Mr. PARDEE,   The following are the results of your recent test(s):  Test     Result     Abdominal Xray   normal Labs       normal   _________________________________________________________  Please call for an appointment as directed _________________________________________________________ _________________________________________________________ _________________________________________________________  Sincerely,  Sanda Linger MD [facility]

## 2010-06-11 NOTE — Letter (Signed)
Summary: [facility] Consult Scheduled Letter  [facility]  [address] [place], [place] [address]   Phone: [phone]  Fax: [phone]      07/16/2009 MRN: [id number]  Jay Holmes [address] [place], [place]  [address]    Dear Mr. ANGUS,      We have scheduled an appointment for you.  At the recommendation of Dr.Jones, we have scheduled you a consult with Dr Jarold Motto on 08/15/09 at 9:00am.  Their phone number is [phone].  If this appointment day and time is not convenient for you, please feel free to call the office of the doctor you are being referred to at the number listed above and reschedule the appointment.     [facility] [address] [place], [place] [address] *Gastroenterology Dept.3rd Floor*   Please give 24hr notice if you need to cancel/reschedule to avoid a $50.00 fee.Also bring insurance card and any co-pay due at time of visit.    Thank you,  Patient Care Coordinator  [facility]

## 2010-06-11 NOTE — Progress Notes (Signed)
Summary: ON CALL - NEVER HAD PREP CALLED IN   Phone Note Call from Patient   Caller: Spouse Call For: DR PATTERSON Details for Reason: NEEDS PREP Summary of Call: PATIENT'S WIFE CALLED THIS SATURDAY NIGHT STATING THAT HER HUSBAND'S COLONOSCOPY PREP WAS NOT CALLED IN... HE HAS A COLONOSCOPY SCHEDULED FOR MONDAY...I CALLED M[facility]I PREP [facility]. Initial call taken by: Hilarie Fredrickson MD,  September 08, 2009 7:34 PM

## 2010-06-11 NOTE — Procedures (Signed)
Summary: Colonoscopy  Patient: [name] Note: All result statuses are Final unless otherwise noted.  Tests: (1) Colonoscopy (COL)   COL Colonoscopy           DONE     [facility]     [address]     [place], [place]  [address]           COLONOSCOPY PROCEDURE REPORT           PATIENT:  Jay Holmes, Jay Holmes  MR#:  [id number]     BIRTHDATE:  1955-06-27, 54 yrs. old  GENDER:  male     ENDOSCOPIST:  Vania Rea. Jarold Motto, MD, [facility]     REF. BY:     PROCEDURE DATE:  09/10/2009     PROCEDURE:  Diagnostic Colonoscopy     ASA CLASS:  Class II     INDICATIONS:  Routine Risk Screening, abdominal pain, heme     positive stool     MEDICATIONS:   Fentanyl 75 mcg IV, Versed 9 mg IV           DESCRIPTION OF PROCEDURE:   After the risks benefits and     alternatives of the procedure were thoroughly explained, informed     consent was obtained.  Digital rectal exam was performed and     revealed no abnormalities.   The [id number] endoscope was     introduced through the anus and advanced to the terminal ileum     which was intubated for a short distance, without limitations.     The quality of the prep was excellent, using MoviPrep.  The     instrument was then slowly withdrawn as the colon was fully     examined.     <<PROCEDUREIMAGES>>           FINDINGS:  The terminal ileum appeared normal.  No polyps or     cancers were seen.  This was otherwise a normal examination of the     colon.   Retroflexed views in the rectum revealed no     abnormalities.    The scope was then withdrawn from the patient     and the procedure completed.           COMPLICATIONS:  None     ENDOSCOPIC IMPRESSION:     1) Normal terminal ileum     2) No polyps or cancers     3) Otherwise normal examination     RECOMMENDATIONS:     HEMESELECT CARDS     REPEAT EXAM:  No           ______________________________     Vania Rea. Jarold Motto, MD, Clementeen Graham           CC:  Jacques Navy, MDThomas Karsten Ro, MD             n.     Rosalie DoctorMarland Kitchen   Vania Rea. Patterson at 09/10/2009 02:06 PM           Yoav, [place], [id number]  Note: An exclamation mark (!) indicates a result that was not dispersed into the flowsheet. Document Creation Date: 09/10/2009 2:06 PM _______________________________________________________________________  (1) Order result status: Final Collection or observation date-time: 09/10/2009 13:59 Requested date-time:  Receipt date-time:  Reported date-time:  Referring Physician:   Ordering Physician: Sheryn Bison [phone]) Specimen Source:  Source: Launa Grill Order Number: [id number] Lab site:

## 2010-06-11 NOTE — Assessment & Plan Note (Signed)
Summary: GERD...AS.    History of Present Illness Visit Type: consult  Primary GI MD: Sheryn Bison MD FACP FAGA Primary Provider: Jacques Navy, MD  Requesting Provider: Sanda Linger, MD  Chief Complaint: GERD and RLQ abd pain  History of Present Illness:   Extremely pleasant 55 year old Caucasian male referred for evaluation of intermittent right lower quadrant pain on 2 occasions over the last 6 months. His pain is described as a dull aching discomfort which comes on suddenly and is present for 12-24 hours with some nausea but no emesis. There are no real precipitating or alleviating elements to his pain, and he denies bowel irregularities, melena, hematochezia, or any hepatobiliary complaints. He does have occasional GERD with dietary indiscretion but denies dysphasia.  Labs show normal CBC and liver profile. His appetite is good and weight is stable. There is no history of previous abdominal surgery, no herniations, or systemic complaints. Primary care evaluation has shown a guaiac positive stool. Last colonoscopy was in December 2006 and was normal. This was a screening exam. He follows a regular diet and denies specific food intolerances. He does have recurrent kidney stones but denies current hematuria or other genitourinary problems. He denies abuse of alcohol, cigarettes, or NSAIDs.   GI Review of Systems    Reports abdominal pain, acid reflux, and  heartburn.     Location of  Abdominal pain: RLQ.    Denies belching, bloating, chest pain, dysphagia with liquids, dysphagia with solids, loss of appetite, nausea, vomiting, vomiting blood, weight loss, and  weight gain.      Reports hemorrhoids and  rectal bleeding.     Denies anal fissure, black tarry stools, change in bowel habit, constipation, diarrhea, diverticulosis, fecal incontinence, heme positive stool, irritable bowel syndrome, jaundice, light color stool, liver problems, and  rectal pain.    Current Medications  (verified): 1)  Synthroid 100 Mcg Tabs (Levothyroxine Sodium) .[name].. 1 By Mouth Once Daily  Allergies (verified): No Known Drug Allergies  Past History:  Past medical, surgical, family and social histories (including risk factors) reviewed for relevance to current acute and chronic problems.  Past Medical History: Reviewed history from 04/14/2007 and no changes required. ALOPECIA, HX OF (ICD-V13.3) Hx of SINUSITIS, RECURRENT (ICD-473.9) NEPHROLITHIASIS, HX OF (ICD-V13.01) HYPOTHYROIDISM (ICD-244.9)    Past Surgical History: Reviewed history from 04/14/2007 and no changes required. TONSILLECTOMY AND ADENOIDECTOMY, HX OF (ICD-V45.79)  Family History: Reviewed history from 04/20/2009 and no changes required. father- 45,  prostate cancer, DM-diet controlled mother- @ 14, memory loss, HTN, lipid, breast cancer -cause of death sister - healthy Neg-colon,cancer Family History of Heart Disease: MGM  Social History: Reviewed history from 04/15/2007 and no changes required. 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  Alcohol Use - no Daily Caffeine Use: 1 daily  Illicit Drug Use - no Drug Use:  no  Review of Systems       The patient complains of allergy/sinus and arthritis/joint pain.  The patient denies anemia, anxiety-new, back pain, blood in urine, breast changes/lumps, change in vision, confusion, cough, coughing up blood, depression-new, fainting, fatigue, fever, headaches-new, hearing problems, heart murmur, heart rhythm changes, itching, muscle pains/cramps, night sweats, nosebleeds, shortness of breath, skin rash, sleeping problems, sore throat, swelling of feet/legs, swollen lymph glands, thirst - excessive, urination - excessive, urination changes/pain, urine leakage, vision changes, and voice change.    Vital Signs:  Patient profile:  55 year old  male Height:      67 inches Weight:      197 pounds BMI:     30.97 BSA:     2.01 Pulse rate:   74 / minute Pulse rhythm:   regular BP sitting:   130 / 70  (left arm) Cuff size:   regular  Vitals Entered By: Ok Anis CMA (August 14, 2009 9:02 AM)  Physical Exam  General:  Well developed, well nourished, no acute distress.healthy appearing.   Head:  Normocephalic and atraumatic. Eyes:  PERRLA, no icterus.exam deferred to patient's ophthalmologist.   Neck:  Supple; no masses or thyromegaly. Lungs:  Clear throughout to auscultation. Heart:  Regular rate and rhythm; no murmurs, rubs,  or bruits. Abdomen:  Soft, nontender and nondistended. No masses, hepatosplenomegaly or hernias noted. Normal bowel sounds. Rectal:  deferred until time of colonoscopy.   Msk:  Symmetrical with no gross deformities. Normal posture. Pulses:  Normal pulses noted. Extremities:  No clubbing, cyanosis, edema or deformities noted. Neurologic:  Alert and  oriented x4;  grossly normal neurologically. Skin:  Intact without significant lesions or rashes. Cervical Nodes:  No significant cervical adenopathy. Inguinal Nodes:  No significant inguinal adenopathy. Psych:  Alert and cooperative. Normal mood and affect.   Impression & Recommendations:  Problem # 1:  ABDOMINAL PAIN-RLQ (ICD-789.03) Assessment Unchanged Atypical right lower quadrant pain with guaiac positive stool-rule out inflammatory bowel disease, cecal carcinoma, unusual presentation of cholelithiasis. I have scheduled upper abdominal ultrasound exam and colonoscopy at his convenience. We will use p.r.n. Levsin until workup has been completed  Problem # 2:  BLOOD IN STOOL (ICD-578.1) Assessment: Comment Only He denies melena or hematochezia. There is no evidence of anemia on CBC exam.  Problem # 3:  GERD (ICD-530.81) Assessment: Improved Intermittent postprandial acid reflux without any red flag symptomatology. Reflex regime reviewed with  p.r.n. AcipHex 20 mg use at bedtime as needed I do not think endoscopy is indicated at this time.  Problem # 4:  NEPHROLITHIASIS, HX OF (ICD-V13.01) Assessment: Comment Only  Patient Instructions: 1)  Copy sent to : Dr. Illene Regulus 2)  Please continue current medications.  3)  Avoid foods high in acid content ( tomatoes, citrus juices, spicy foods) . Avoid eating within 3 to 4 hours of lying down or before exercising. Do not over eat; try smaller more frequent meals. Elevate head of bed four inches when sleeping.  4)  Colonoscopy and Flexible Sigmoidoscopy brochure given.  5)  Conscious Sedation brochure given.  6)  Since he has glaucoma, we will avoid anticholinergics and use p.r.n. tramadol 50 mg for pain. 7)  Urinalysis requested per history of nephrolithiasis.  Appended Document: GERD...AS.    Clinical Lists Changes  Problems: Added new problem of ABDOMINAL PAIN, GENERALIZED (ICD-789.07) Medications: Added new medication of MOVIPREP 100 GM  SOLR (PEG-KCL-NACL-NASULF-NA ASC-C) As per prep instructions. - Signed Added new medication of TRAMADOL HCL 50 MG TABS (TRAMADOL HCL) 1 by mouth q 6-8 hrs as needed - Signed Rx of MOVIPREP 100 GM  SOLR (PEG-KCL-NACL-NASULF-NA ASC-C) As per prep instructions.;  #1 x 0;  Signed;  Entered by: Ashok Cordia RN;  Authorized by: Mardella Layman MD [facility];  Method used: [facility]  [phone]*, [address] [place], [place]  [address], Ph: [phone] or [phone], Fax: [phone] Rx of TRAMADOL HCL 50 MG TABS (TRAMADOL HCL) 1 by mouth q 6-8 hrs as needed;  #40 x 3;  Signed;  Entered by: Ashok Cordia RN;  Authorized by: Mardella Layman MD [facility];  Method used: [facility] to [facility]  [phone]*, [address] [place], [place]  [address], Ph: [phone] or [phone], Fax: [phone] Orders: Added new Test order of TLB-Udip w/ Micro (81001-URINE) - Signed Added new Test order of Colonoscopy (Colon) -  Signed Added new Test order of Ultrasound Abdomen (UAS) - Signed    Prescriptions: TRAMADOL HCL 50 MG TABS (TRAMADOL HCL) 1 by mouth q 6-8 hrs as needed  #40 x 3   Entered by:   Ashok Cordia RN   Authorized by:   Mardella Layman MD [facility]   Signed by:   Ashok Cordia RN on 08/14/2009   Method used:   Electronically to        [facility]  [phone]* (retail)       [address] [place], [place]  [address]       Ph: [phone] or [phone]       Fax: [phone]   RxID:   [id number] MOVIPREP 100 GM  SOLR (PEG-KCL-NACL-NASULF-NA ASC-C) As per prep instructions.  #1 x 0   Entered by:   Ashok Cordia RN   Authorized by:   Mardella Layman MD [facility]   Signed by:   Ashok Cordia RN on 08/14/2009   Method used:   Electronically to        [facility]  [phone]* (retail)       [address] [place], [place]  [address]       Ph: [phone] or [phone]       Fax: [phone]   RxID:   [id number]

## 2010-06-11 NOTE — Letter (Signed)
Summary: [facility]: Immunoassay Fecal Occult Blood (iFOB) Order [facility]  [address] [place], [place] [address]   Phone: [phone]  Fax: [phone]      [facility]: Immunoassay Fecal Occult Blood (iFOB) Order Form   Sep 20, 2009 MRN: [id number]   Jay Holmes 07/20/1955   Physicican Name: Jarold Motto  Diagnosis Code: 324.40,102.7      Ashok Cordia RN

## 2010-06-11 NOTE — Procedures (Signed)
Summary: Colon   Colonoscopy  Procedure date:  04/28/2005  Findings:      Location:  [facility].   Patient Name: Jay Holmes, Jay Holmes MRN:  Procedure Procedures: Colonoscopy CPT: [phone].  Personnel: Endoscopist: Vania Rea. Jarold Motto, MD.  Exam Location: Exam performed in Outpatient Clinic. Outpatient  Patient Consent: Procedure, Alternatives, Risks and Benefits discussed, consent obtained, from patient. Consent was obtained by the RN.  Indications  Average Risk Screening Routine.  History  Current Medications: Patient is not currently taking Coumadin.  Pre-Exam Physical: Performed Apr 28, 2005. Cardio-pulmonary exam, Rectal exam, Abdominal exam, Extremity exam, Mental status exam WNL.  Comments: Pt. history reviewed/updated, physical exam performed prior to initiation of sedation? Exam Exam: Extent of exam reached: Cecum, extent intended: Cecum.  The cecum was identified by appendiceal orifice and IC valve. Patient position: on left side. Duration of exam: 20 minutes. Colon retroflexion performed. Images taken. ASA Classification: I. Tolerance: excellent.  Monitoring: Pulse and BP monitoring, Oximetry used. Supplemental O2 given. at 2 Liters.  Colon Prep Used Golytely for colon prep. Prep results: good.  Sedation Meds: Patient assessed and found to be appropriate for moderate (conscious) sedation. Fentanyl 75 mcg. given IV. Versed 7 mg. given IV.  Instrument(s): CF 140L. Serial [id number].  Findings - NORMAL EXAM: Cecum to Rectum. Not Seen: Polyps. AVM's. Colitis. Tumors. Melanosis. Crohn's. Diverticulosis. Hemorrhoids.   Assessment Normal examination.  Events  Unplanned Interventions: No intervention was required.  Plans Medication Plan: Referring provider to order medications.  Patient Education: Patient given standard instructions for: a normal exam.  Disposition: After procedure patient sent to recovery. After recovery patient  sent home.  Scheduling/Referral: Follow-Up prn.    This report was created from the original endoscopy report, which was reviewed and signed by the above listed endoscopist.

## 2010-06-11 NOTE — Assessment & Plan Note (Signed)
Summary: cough/cd   Vital Signs:  Patient profile:   55 year old male Height:      67 inches Weight:      201.25 pounds BMI:     31.63 O2 Sat:      97 % on Room air Temp:     98.2 degrees F oral Pulse rate:   100 / minute Pulse rhythm:   regular BP sitting:   122 / 74 Cuff size:   large  Vitals Entered By: Brenton Grills (June 26, 2009 1:52 PM)  O2 Flow:  Room air CC: pt c/o cough x 2 weeks/chest congestion/aj   Primary Care Provider:  Rocsi Hazelbaker  CC:  pt c/o cough x 2 weeks/chest congestion/aj.  History of Present Illness: Patient for a 2 week h/o cough and congestion that he does not seem to be able to shake. No rhinorrhea, no sinus pressure, no fever. Mucus is greenish toned thin mucus. No SOB. No N/V/D. A little bit of a tickle cough. Has not been taking anything for the cough.   Current Medications (verified): 1)  Synthroid 100 Mcg Tabs (Levothyroxine Sodium) .[name].. 1 By Mouth Once Daily  Allergies (verified): No Known Drug Allergies  Past History:  Past Medical History: Last updated: 04/14/2007 ALOPECIA, HX OF (ICD-V13.3) Hx of SINUSITIS, RECURRENT (ICD-473.9) NEPHROLITHIASIS, HX OF (ICD-V13.01) HYPOTHYROIDISM (ICD-244.9)    Past Surgical History: Last updated: 04/14/2007 TONSILLECTOMY AND ADENOIDECTOMY, HX OF (ICD-V45.79)  Family History: Last updated: 04/20/2009 father- 1924,  prostate cancer, DM-diet controlled mother- @ 18, memory loss, HTN, lipid, breast cancer -cause of death sister - healthy Neg-colon,cancer, CAD, CVA  Social History: Last updated: 04/15/2007 [place] BA-ac[employer]ng married - 1998 1-daughter 1999, 1 son 2003 (cleft lip/palate) wife is healthy work: Psychologist, educational at [employer]  Risk Factors: Alcohol Use: 0 (03/01/2009) Exercise: no (04/15/2007)  Risk Factors: Smoking Status: never (03/01/2009)  Review of Systems  The patient denies anorexia, fever, weight loss, weight gain, hoarseness, chest pain, dyspnea on exertion,  prolonged cough, hemoptysis, abdominal pain, muscle weakness, difficulty walking, depression, enlarged lymph nodes, and angioedema.    Physical Exam  General:  alert, well-developed, well-nourished, and well-hydrated.   Head:  no sinus tenderness to percussion Eyes:  No corneal or conjunctival inflammation noted. EOMI. Perrla. Funduscopic exam benign, without hemorrhages, exudates or papilledema. Vision grossly normal. Mouth:  posteior pharynx clear Neck:  full ROM.   Lungs:  Normal respiratory effort, chest expands symmetrically. Lungs are clear to auscultation, no crackles or wheezes. Heart:  normal rate and regular rhythm.   Skin:  turgor normal and color normal.     Impression & Recommendations:  Problem # 1:  URI (ICD-465.9)  Little evidence of a bacterial infection. Suspect post-infectious irritative cough.  Plan - Robitussin DM          Benzonatate 100mg  three times a day          z-pak  His updated medication list for this problem includes:    Benzonatate 100 Mg Caps (Benzonatate) .[name]... 1 by mouth three times a day for cough  Orders: Prescription Created Electronically [phone])  Complete Medication List: 1)  Synthroid 100 Mcg Tabs (Levothyroxine sodium) .[name].. 1 by mouth once daily 2)  Azithromycin 250 Mg Tabs (Azithromycin) .... 2 tabs day 1,m 1 tab once daily days 2-5 3)  Benzonatate 100 Mg Caps (Benzonatate) .[name].. 1 by mouth three times a day for cough  Patient Instructions: 1)  cough - post-infectious irritable cough. Little evidence of a serious bacterial  infection. Plan - z-pak (generic), benzonatate perles 1 three times a day for "sratchy" cough, robitussin DM or generic equivalent for cough. Prescriptions: BENZONATATE 100 MG CAPS (BENZONATATE) 1 by mouth three times a day for cough  #30 x 1   Entered and Authorized by:   Jacques Navy MD   Signed by:   Jacques Navy MD on 06/26/2009   Method used:   Electronically to        [facility]  [phone]*  (retail)       [address] [place], [place]  [address]       Ph: [phone] or [phone]       Fax: [phone]   RxID:   [id number] AZITHROMYCIN 250 MG TABS (AZITHROMYCIN) 2 tabs day 1,m 1 tab once daily days 2-5  #6 x 0   Entered and Authorized by:   Jacques Navy MD   Signed by:   Jacques Navy MD on 06/26/2009   Method used:   Electronically to        [facility]  [phone]* (retail)       [address] [place], [place]  [address]       Ph: [phone] or [phone]       Fax: [phone]   RxID:   [id number]

## 2010-06-11 NOTE — Letter (Signed)
Summary: Patient Notice-Endo Biopsy Results  [facility]  [address] [place], [place] [address]   Phone: [phone]  Fax: [phone]        Sep 18, 2009 MRN: [id number]    [facility] [address] [place], [place]  [address]    Dear Mr. HARTLAGE,  I am pleased to inform you that the biopsies taken during your recent endoscopic examination did not show any evidence of cancer upon pathologic examination.  Additional information/recommendations:  __No further action is needed at this time.  Please follow-up with      your primary care physician for your other healthcare needs.  __ Please call [phone] to schedule a return visit to review      your condition.  _xx_ Continue with the treatment plan as outlined on the day of your      exam.  __ You should have a repeat endoscopic examination for this problem              in _ months/years.   Please call us if you are having persistent problems or have questions about your condition that have not been fully answered at this time.  Sincerely,  Mardella Layman MD [facility]  This letter has been electronically signed by your physician.  Appended Document: Patient Notice-Endo Biopsy Results letter mailed 5.11.11

## 2010-06-11 NOTE — Progress Notes (Signed)
Summary: Stool for blood   Phone Note Outgoing Call   Call placed by: Ashok Cordia RN,  Sep 19, 2009 10:07 AM Summary of Call: Pt needs to do stool check for blood per colon report.  Attempted to call pt re this.  No answer. Initial call taken by: Ashok Cordia RN,  Sep 19, 2009 10:08 AM  Follow-up for Phone Call        Lm for pt to call.   Ashok Cordia RN  Sep 20, 2009 3:30 PM  Talked with pt;s wife.  Wife will stop by to get information re stool check for blood. Follow-up by: Ashok Cordia RN,  Sep 20, 2009 4:48 PM

## 2010-08-30 ENCOUNTER — Other Ambulatory Visit: Payer: Self-pay | Admitting: Internal Medicine

## 2010-09-27 NOTE — Assessment & Plan Note (Signed)
[facility]                                 ON-CALL NOTE   NAME:Jay Holmes, Jay Holmes                         MRN:          [id number]  DATE:06/13/2007                            DOB:          07/09/1955    June 13, 2007, at 6:27 p.m.  Phone number is [phone].  Dr. Debby Bud is his regular doctor.   CHIEF COMPLAINT:  Urinary pressure.   The patient states that is fairly healthy.  He takes thyroid medicine  but nothing else.  He had a medical check-up recently and was doing  well.  He is 55 years old.  He an hour ago started having some urinary  pressure.  He feels like has to urinate but when he tries to go, there  are just a few drops.  He describes a vague burning sensation but he  cannot quite describe it.  Denies back pain, nausea, fever, chills,  blood in his urine or any other symptoms.  I told him that he should try  to drink some water tonight and continue trying to urinate when he feels  he needs to go, that if he develops urinary retention, that is, cannot  urinate and gets more pain, he should go to the emergency room or urgent  care for evaluation.  If he develops fever, back pain or other symptoms,  to either go in to get checked out or call back.  Otherwise, to call Dr.  Debby Bud' office first thing in the morning to get an appointment to be  seen, and I told him this could possibly be a prostate or urinary  infection but it was hard to tell from his description.  So he will  follow up tomorrow unless he gets worse tonight.     Marne A. Tower, MD  Electronically Signed    MAT/MedQ  DD: 06/13/2007  DT: 06/14/2007  Job #: [id number]

## 2010-10-02 ENCOUNTER — Other Ambulatory Visit (INDEPENDENT_AMBULATORY_CARE_PROVIDER_SITE_OTHER): Payer: Self-pay

## 2010-10-02 ENCOUNTER — Other Ambulatory Visit: Payer: Self-pay | Admitting: Internal Medicine

## 2010-10-02 DIAGNOSIS — Z0000 Encounter for general adult medical examination without abnormal findings: Secondary | ICD-10-CM

## 2010-10-02 DIAGNOSIS — Z0389 Encounter for observation for other suspected diseases and conditions ruled out: Secondary | ICD-10-CM

## 2010-10-02 LAB — CBC WITH DIFFERENTIAL/PLATELET
Basophils Relative: 0.4 % (ref 0.0–3.0)
Eosinophils Absolute: 0.1 10*3/uL (ref 0.0–0.7)
Eosinophils Relative: 2.5 % (ref 0.0–5.0)
HCT: 45.5 % (ref 39.0–52.0)
Lymphs Abs: 2.1 10*3/uL (ref 0.7–4.0)
MCHC: 33.7 g/dL (ref 30.0–36.0)
MCV: 94.4 fl (ref 78.0–100.0)
Monocytes Absolute: 0.6 10*3/uL (ref 0.1–1.0)
Neutrophils Relative %: 50.5 % (ref 43.0–77.0)
RBC: 4.82 Mil/uL (ref 4.22–5.81)

## 2010-10-02 LAB — BASIC METABOLIC PANEL
Calcium: 9.1 mg/dL (ref 8.4–10.5)
Creatinine, Ser: 0.9 mg/dL (ref 0.4–1.5)
GFR: 93.95 mL/min (ref >60.00)

## 2010-10-02 LAB — URINALYSIS
Ketones, ur: NEGATIVE
Leukocytes, UA: NEGATIVE
Specific Gravity, Urine: >=1.03 (ref 1.000–1.030)
Urine Glucose: NEGATIVE
pH: 5.5 (ref 5.0–8.0)

## 2010-10-02 LAB — LIPID PANEL
Cholesterol: 146 mg/dL (ref 0–200)
Triglycerides: 66 mg/dL (ref 0.0–149.0)

## 2010-10-02 LAB — HEPATIC FUNCTION PANEL
ALT: 35 U/L (ref 0–53)
Alkaline Phosphatase: 101 U/L (ref 39–117)
Bilirubin, Direct: 0.1 mg/dL (ref 0.0–0.3)
Total Bilirubin: 0.8 mg/dL (ref 0.3–1.2)
Total Protein: 6.9 g/dL (ref 6.0–8.3)

## 2010-10-08 ENCOUNTER — Encounter: Payer: Self-pay | Admitting: Internal Medicine

## 2010-10-09 ENCOUNTER — Ambulatory Visit (INDEPENDENT_AMBULATORY_CARE_PROVIDER_SITE_OTHER): Payer: 59 | Admitting: Internal Medicine

## 2010-10-09 ENCOUNTER — Encounter: Payer: Self-pay | Admitting: Internal Medicine

## 2010-10-09 VITALS — BP 106/62 | HR 71 | Temp 98.3°F | Wt 195.0 lb

## 2010-10-09 DIAGNOSIS — E663 Overweight: Secondary | ICD-10-CM

## 2010-10-09 DIAGNOSIS — E039 Hypothyroidism, unspecified: Secondary | ICD-10-CM

## 2010-10-09 DIAGNOSIS — H409 Unspecified glaucoma: Secondary | ICD-10-CM

## 2010-10-09 DIAGNOSIS — K219 Gastro-esophageal reflux disease without esophagitis: Secondary | ICD-10-CM

## 2010-10-09 DIAGNOSIS — Z136 Encounter for screening for cardiovascular disorders: Secondary | ICD-10-CM

## 2010-10-09 DIAGNOSIS — Z87442 Personal history of urinary calculi: Secondary | ICD-10-CM

## 2010-10-09 DIAGNOSIS — Z0000 Encounter for general adult medical examination without abnormal findings: Secondary | ICD-10-CM

## 2010-10-09 MED ORDER — OMEPRAZOLE 40 MG PO CPDR: 40.0000 mg | DELAYED_RELEASE_CAPSULE | Freq: Every day | ORAL | Status: DC

## 2010-10-09 NOTE — Progress Notes (Signed)
Subjective:    Patient ID: Jay Holmes, male    DOB: 11/17/1955, 55 y.o.   MRN: [id number]  HPI Mr. Talarico presents for annual medical exam. He has had no major illness, injury or surgery in the interval since his last visit. He has had some minor ailments. He is very concerned about his weight and weight management which he understands is a major health threat. In general he is doing well and life is good. His household is recovering from the recent death of his wife's aunt who lived with them for several years.    Past Medical History  Diagnosis Date  . Personal history of diseases of skin and subcutaneous tissue   . Unspecified sinusitis (chronic)   . Personal history of urinary calculi   . Unspecified hypothyroidism    Past Surgical History  Procedure Date  . Tonsillectomy and adenoidectomy    Family History  Problem Relation Age of Onset  . Memory loss Mother   . Hypertension Mother   . Hyperlipidemia Mother   . Cancer Mother     breast cancer  . Cancer Father     prostate  . Diabetes Father     diet controlled  . Cleft lip Son   . Heart disease Maternal Grandmother    History   Social History  . Marital Status: Married    Spouse Name: N/A    Number of Children: N/A  . Years of Education: N/A   Occupational History  . 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 Smoking status: Never Smoker   . Smokeless tobacco: Never Used  . Alcohol Use: No  . Drug Use: No  . Sexually Active: Yes -- Male partner(s)   Other Topics Concern  . Not on file   Social History Narrative   [place] BA-acctng. 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 1-daughter 1999, 1 son 2003 (cleft lip/palate). wife is healthy.  work: Psychologist, educational at [employer]      Review of Systems Review of Systems  Constitutional:  Negative for fever, chills, activity change and unexpected weight change.  HENT:  Negative for hearing loss, ear pain, congestion, neck stiffness and  postnasal drip.   Eyes: Negative for pain, discharge and visual disturbance.  Respiratory: Negative for chest tightness and wheezing.   Cardiovascular: Negative for chest pain and palpitations.       [No decreased exercise tolerance Gastrointestinal: [No change in bowel habit. No bloating or gas. No reflux or indigestion but increased borborygmi. Genitourinary: Negative for urgency, frequency, flank pain and difficulty urinating.  Musculoskeletal: Negative for myalgias, back pain, arthralgias and gait problem.  Neurological: Negative for dizziness, tremors, weakness and headaches.  Hematological: Negative for adenopathy.  Psychiatric/Behavioral: Negative for behavioral problems and dysphoric mood.       Objective:   Physical Exam Vital signs reviewed Gen'l: Well nourished well developed white male in no acute distress  HENT:  Head: Normocephalic and atraumatic.  Right Ear: External ear normal. EAC/TM nl Left Ear: External ear normal.  EAC/TM nl Nose: Nose normal.  Mouth/Throat: Oropharynx is clear and moist. Dentition - native, in good repair. No buccal or palatal lesions. Posterior pharynx clear. Eyes: Conjunctivae and sclera clear. EOM intact. Pupils are equal, round, and reactive to light. Right eye exhibits no discharge. Left eye exhibits no discharge. Neck: Normal range of motion. Neck supple. No JVD present. No tracheal deviation present. No thyromegaly present.  Cardiovascular: Normal rate, regular  rhythm, no gallop, no friction rub, no murmur heard.      Quiet precordium. 2+ radial and DP pulses  Pulmonary/Chest: Effort normal. No respiratory distress or increased WOB, no wheezes, no rales. No chest wall deformity or CVAT. Abdominal: Soft, protruberant.  Bowel sounds are normal in all quadrants. He exhibits no distension, no tenderness, no rebound or guarding, No heptosplenomegaly  Genitourinary: deferred to normal PSA Musculoskeletal: Normal range of motion. He exhibits no  edema and no tenderness.       Small and large joints without redness, synovial thickening or deformity. Full range of motion preserved about all small, median and large joints. He does have hyperextensibility of the DIP, PIP joints both hands but no fixed deformity. Lymphadenopathy:    He has no cervical or supraclavicular adenopathy.  Neurological: He is alert and oriented to person, place, and time. CN II-XII intact. DTRs 2+ and symmetrical biceps, radial and patellar tendons. Cerebellar function normal with no tremor, rigidity, normal gait and station.  Skin: Skin is warm and dry. No rash noted. No erythema.  Psychiatric: He has a normal mood and affect. His behavior is normal. Thought content normal.      Lab Results  Component Value Date   WBC 5.8 10/02/2010   HGB 15.3 10/02/2010   HCT 45.5 10/02/2010   PLT 211.0 10/02/2010   CHOL 146 10/02/2010   TRIG 66.0 10/02/2010   HDL 36.10* 10/02/2010   ALT 35 10/02/2010   AST 23 10/02/2010   NA 140 10/02/2010   K 5.0 10/02/2010   CL 106 10/02/2010   CREATININE 0.9 10/02/2010   BUN 22 10/02/2010   CO2 29 10/02/2010   TSH 2.77 10/02/2010   PSA 0.26 10/02/2010   Lab Results  Component Value Date   LDLCALC 97 10/02/2010  \  Assessment & Plan:  1. Health maintenance - interval history is benign. Physical exam is unremarkable. Labs results are in normal range except for slightly low HDL. Framingham Cardiac risk calculates at 5% for any event over the next 10 years. Last colonoscopy May '11. Immunizations - he is due for tetanus immunization by our records. 12 lead EKG without evidence of injury or ischemia.  In summary he is doing well and is medically stable. He will return as needed or in 1 year.

## 2010-10-10 NOTE — Assessment & Plan Note (Signed)
History of kidney stone.  Plan - acidify urine - 30 cc concentrated lemon juice in diet tonic water every day.

## 2010-10-10 NOTE — Assessment & Plan Note (Signed)
Patient with mild GERD with no active complaint at today's visit.  Plan - continue present regimen

## 2010-10-10 NOTE — Assessment & Plan Note (Signed)
Stable with no progressive change in vision.   Plan - continue with drops as prescribed.           F/u w/ Dr. Dione Booze as instructed.

## 2010-10-10 NOTE — Assessment & Plan Note (Signed)
He is stable and without symptoms. TSH is in normal range on present medications.  Plan - continue present dose of replacement thyroid

## 2010-10-10 NOTE — Assessment & Plan Note (Signed)
On-going problem with no meaningful change in weight over the interval since his last visit. Reveiwed the principles of weight management: smart food choices, PORTION SIZE control and regular exercise.  Plan - healthy snack choices and apportioned amounts!!           Target weight 180 lbs; goal is to loose 1-2 lbs per month

## 2011-02-07 LAB — CBC
HCT: 45.5
Hemoglobin: 15.8
MCHC: 34.7
MCV: 92.9
Platelets: 217
RDW: 13.8

## 2011-02-07 LAB — URINALYSIS, ROUTINE W REFLEX MICROSCOPIC
Glucose, UA: NEGATIVE
Ketones, ur: NEGATIVE
Protein, ur: NEGATIVE
Urobilinogen, UA: 0.2

## 2011-02-07 LAB — DIFFERENTIAL
Basophils Absolute: 0
Eosinophils Absolute: 0.3
Eosinophils Relative: 3
Monocytes Absolute: 0.9

## 2011-02-07 LAB — POCT I-STAT, CHEM 8
Calcium, Ion: 1.08 — ABNORMAL LOW
Hemoglobin: 16
Sodium: 137
TCO2: 24

## 2011-02-07 LAB — URINE MICROSCOPIC-ADD ON

## 2011-03-14 ENCOUNTER — Telehealth: Payer: Self-pay | Admitting: *Deleted

## 2011-03-14 NOTE — Telephone Encounter (Signed)
Opened in ERROR

## 2011-04-04 ENCOUNTER — Ambulatory Visit (INDEPENDENT_AMBULATORY_CARE_PROVIDER_SITE_OTHER): Payer: 59 | Admitting: Internal Medicine

## 2011-04-04 ENCOUNTER — Encounter: Payer: Self-pay | Admitting: Internal Medicine

## 2011-04-04 VITALS — BP 110/70 | HR 74 | Temp 98.1°F | Wt 194.1 lb

## 2011-04-04 DIAGNOSIS — J209 Acute bronchitis, unspecified: Secondary | ICD-10-CM

## 2011-04-04 DIAGNOSIS — R062 Wheezing: Secondary | ICD-10-CM

## 2011-04-04 MED ORDER — PREDNISONE 10 MG PO TABS: 10.0000 mg | ORAL_TABLET | Freq: Every day | ORAL | Status: AC

## 2011-04-04 MED ORDER — METHYLPREDNISOLONE ACETATE PF 80 MG/ML IJ SUSP
120.0000 mg | Freq: Once | INTRAMUSCULAR | Status: AC
  Administered 2011-04-04: 120 mg via INTRAMUSCULAR

## 2011-04-04 MED ORDER — LEVOFLOXACIN 250 MG PO TABS: 250.0000 mg | ORAL_TABLET | Freq: Every day | ORAL | Status: AC

## 2011-04-04 MED ORDER — HYDROCODONE-HOMATROPINE 5-1.5 MG/5ML PO SYRP: 5.0000 mL | ORAL_SOLUTION | Freq: Four times a day (QID) | ORAL | Status: AC | PRN

## 2011-04-04 NOTE — Patient Instructions (Addendum)
You had the steroid shot today Take all new medications as prescribed - the antibiotic, cough medicine, and prednisone for wheezing Continue all other medications as before

## 2011-04-05 ENCOUNTER — Encounter: Payer: Self-pay | Admitting: Internal Medicine

## 2011-04-05 NOTE — Assessment & Plan Note (Signed)
Mild to mod, for antibx course,  to f/u any worsening symptoms or concerns 

## 2011-04-05 NOTE — Assessment & Plan Note (Signed)
Mild to mod, for depomedrol IM and predpack asd,  to f/u any worsening symptoms or concerns 

## 2011-04-05 NOTE — Progress Notes (Signed)
  Subjective:    Patient ID: Jay Holmes, male    DOB: [birth year], 55 y.o.   MRN: [id number]  HPI  Here with acute onset mild to mod 2-3 days ST, HA, general weakness and malaise, with prod cough greenish sputum, but Pt denies chest pain, increased sob or doe, wheezing, orthopnea, PND, increased LE swelling, palpitations, dizziness or syncope, except for mild wheezing today with mild DOE. Pt denies new neurological symptoms such as new headache, or facial or extremity weakness or numbness   Pt denies polydipsia, polyuria  Past Medical History  Diagnosis Date  . Personal history of diseases of skin and subcutaneous tissue   . Unspecified sinusitis (chronic)   . Personal history of urinary calculi   . Unspecified hypothyroidism    Past Surgical History  Procedure Date  . Tonsillectomy and adenoidectomy     reports that he has never smoked. He has never used smokeless tobacco. He reports that he does not drink alcohol or use illicit drugs. family history includes Cancer in his father and mother; Cleft lip in his son; Diabetes in his father; Heart disease in his maternal grandmother; Hyperlipidemia in his mother; Hypertension in his mother; and Memory loss in his mother. No Known Allergies Current Outpatient Prescriptions on File Prior to Visit  Medication Sig Dispense Refill  . Ascorbic Acid (VITAMIN C) 1000 MG tablet Take 1,000 mg by mouth daily.        . bimatoprost (LUMIGAN) 0.03 % ophthalmic solution Place 1 drop into both eyes at bedtime.        . Glucosamine-Chondroit-Vit C-Mn (GLUCOSAMINE 1500 COMPLEX) CAPS Take 1 capsule by mouth daily.        [name] levothyroxine (SYNTHROID, LEVOTHROID) 100 MCG tablet TAKE 1 TABLET DAILY  90 tablet  3  . Omega-3 Fatty Acids (FISH OIL) 300 MG CAPS Take 1 capsule by mouth daily.        [name] omeprazole (PRILOSEC) 40 MG capsule Take 1 capsule (40 mg total) by mouth daily.  90 capsule  3    Review of Systems All otherwise neg per pt     Objective:   Physical Exam BP 110/70  Pulse 74  Temp(Src) 98.1 F (36.7 C) (Oral)  Wt 194 lb 1.3 oz (88.034 kg)  SpO2 94% Physical Exam  VS noted, mild ill Constitutional: Pt appears well-developed and well-nourished.  HENT: Head: Normocephalic.  Right Ear: External ear normal.  Left Ear: External ear normal.  Bilat tm's mild erythema.  Sinus nontender.  Pharynx mild erythema Eyes: Conjunctivae and EOM are normal. Pupils are equal, round, and reactive to light.  Neck: Normal range of motion. Neck supple.  Cardiovascular: Normal rate and regular rhythm.   Pulmonary/Chest: Effort normal and breath sounds mild decreased with bilat wheeze.  Skin: Skin is warm. No erythema.  Psychiatric: Pt behavior is normal. Thought content normal.     Assessment & Plan:

## 2011-04-07 ENCOUNTER — Telehealth: Payer: Self-pay

## 2011-04-07 MED ORDER — BENZONATATE 100 MG PO CAPS: ORAL_CAPSULE | ORAL | Status: DC

## 2011-04-07 NOTE — Telephone Encounter (Signed)
The patient was seen on 04/04/2011 and given a cough medication.  The cough med. Is not working, please alternative. Call back number for this patient is [phone] Karin Golden  Friend[place].

## 2011-04-07 NOTE — Telephone Encounter (Signed)
Called the patient informed prescription requested has been sent in. 

## 2011-04-07 NOTE — Telephone Encounter (Signed)
Ok to add tessalon perle

## 2011-07-10 ENCOUNTER — Other Ambulatory Visit: Payer: Self-pay | Admitting: Internal Medicine

## 2011-07-10 NOTE — Telephone Encounter (Signed)
Done

## 2011-12-06 ENCOUNTER — Other Ambulatory Visit: Payer: Self-pay | Admitting: Internal Medicine

## 2011-12-29 ENCOUNTER — Ambulatory Visit (INDEPENDENT_AMBULATORY_CARE_PROVIDER_SITE_OTHER): Payer: 59 | Admitting: Endocrinology

## 2011-12-29 ENCOUNTER — Encounter: Payer: Self-pay | Admitting: Endocrinology

## 2011-12-29 ENCOUNTER — Telehealth: Payer: Self-pay | Admitting: Internal Medicine

## 2011-12-29 VITALS — BP 120/80 | HR 88 | Temp 97.4°F | Resp 16 | Wt 198.8 lb

## 2011-12-29 DIAGNOSIS — L259 Unspecified contact dermatitis, unspecified cause: Secondary | ICD-10-CM

## 2011-12-29 MED ORDER — FLUOCINONIDE 0.05 % EX CREA: TOPICAL_CREAM | Freq: Three times a day (TID) | CUTANEOUS | Status: DC

## 2011-12-29 MED ORDER — METHYLPREDNISOLONE (PAK) 4 MG PO TABS: ORAL_TABLET | ORAL | Status: AC

## 2011-12-29 NOTE — Patient Instructions (Addendum)
i have sent 2 steroid prescriptions to your pharmacy: pills and skin cream. I hope you feel better soon.  If you don't feel better in the next few days, please call back.

## 2011-12-29 NOTE — Telephone Encounter (Signed)
Caller: Berthel/Patient; Patient Name: Jay Holmes; PCP: Illene Regulus; Best Callback Phone Number: [phone].  Pt reports having Poison Ivy/oak all over arms, hands, legs. Pt also thinks he has some on genitals (no spots but itching)  Pt has tried OTC meds that are not helping.  PT wanted to know if antibiotic that has been called in for him before can be called in again for him.  All emergent symptoms of Poison [place], [place] or [place] Exposure ruled out with exception to 'Involves eyes, mouth or genitals'.  Home care advice given and appt scheduled for 3:00 on 12/29/11 with Dr Everardo All

## 2011-12-29 NOTE — Progress Notes (Signed)
  Subjective:    Patient ID: Jay Holmes, male    DOB: 07/22/1955, 56 y.o.   MRN: [id number]  HPI Pt states a few days of moderate rash at the forearms, and assoc rash on the penis.   Past Medical History  Diagnosis Date  . Personal history of diseases of skin and subcutaneous tissue   . Unspecified sinusitis (chronic)   . Personal history of urinary calculi   . Unspecified hypothyroidism     Past Surgical History  Procedure Date  . Tonsillectomy and adenoidectomy     History   Social History  . Marital Status: Married    Spouse Name: N/A    Number of Children: N/A  . Years of Education: N/A   Occupational History  . 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 Smoking status: Never Smoker   . Smokeless tobacco: Never Used  . Alcohol Use: No  . Drug Use: No  . Sexually Active: Yes -- Male partner(s)   Other Topics Concern  . Not on file   Social History Narrative   [place] BA-acctng. 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 1-daughter 1999, 1 son 2003 (cleft lip/palate). wife is healthy.  work: Psychologist, educational at [employer]    Current Outpatient Prescriptions on File Prior to Visit  Medication Sig Dispense Refill  . Ascorbic Acid (VITAMIN C) 1000 MG tablet Take 1,000 mg by mouth daily.        . benzonatate (TESSALON PERLES) 100 MG capsule 1-2 tab by mouth up to three times per day as needed for cough  90 capsule  1  . bimatoprost (LUMIGAN) 0.03 % ophthalmic solution Place 1 drop into both eyes at bedtime.        . Glucosamine-Chondroit-Vit C-Mn (GLUCOSAMINE 1500 COMPLEX) CAPS Take 1 capsule by mouth daily.        [name] levothyroxine (SYNTHROID, LEVOTHROID) 100 MCG tablet TAKE 1 TABLET DAILY  90 tablet  2  . Omega-3 Fatty Acids (FISH OIL) 300 MG CAPS Take 1 capsule by mouth daily.        [name] omeprazole (PRILOSEC) 40 MG capsule TAKE 1 CAPSULE DAILY  90 capsule  3    No Known Allergies  Family History  Problem Relation Age of Onset  . Memory loss  Mother   . Hypertension Mother   . Hyperlipidemia Mother   . Cancer Mother     breast cancer  . Cancer Father     prostate  . Diabetes Father     diet controlled  . Cleft lip Son   . Heart disease Maternal Grandmother     BP 120/80  Pulse 88  Temp 97.4 F (36.3 C) (Oral)  Resp 16  Wt 198 lb 12 oz (90.152 kg)  SpO2 95%    Review of Systems Denies fever.  he has itching.      Objective:   Physical Exam VITAL SIGNS:  See vs page GENERAL: no distress Skin: moderate contact dermatitis are these areas.         Assessment & Plan:  contact dermatitis, new

## 2012-01-09 ENCOUNTER — Telehealth: Payer: Self-pay | Admitting: Internal Medicine

## 2012-01-09 NOTE — Telephone Encounter (Signed)
Caller: Susan/Spouse; Patient Name: Jay Holmes; PCP: Illene Regulus (Adults only); Best Callback Phone Number: [phone].  Call regarding follow up with Posion Ivy Exposure.  Darl Pikes says the rash has not cleared up on his finger tips and it continues to itch. Darl Pikes concerned the poison ivy is not out of his system.  Pt has finished the Prednisone.  All emergent symptoms ruled out per [facility], [place], or [place] Exposure protocol;  See Provider within 24 hrs disposition due to ' Symptoms are not improving or are worsening after 3 days of home treatment'.  Caller declined appt.    PLEASE FOLLOW UP WITH PT IN REGARD TO POISON IVY SYMPTOMS.  Thank you

## 2012-03-31 ENCOUNTER — Other Ambulatory Visit: Payer: Self-pay | Admitting: *Deleted

## 2012-03-31 MED ORDER — LEVOTHYROXINE SODIUM 100 MCG PO TABS: 100.0000 ug | ORAL_TABLET | Freq: Every day | ORAL | Status: DC

## 2012-04-05 ENCOUNTER — Other Ambulatory Visit: Payer: Self-pay | Admitting: *Deleted

## 2012-04-05 MED ORDER — LEVOTHYROXINE SODIUM 100 MCG PO TABS: 100.0000 ug | ORAL_TABLET | Freq: Every day | ORAL | Status: DC

## 2012-07-21 ENCOUNTER — Encounter: Payer: Self-pay | Admitting: Internal Medicine

## 2012-07-21 ENCOUNTER — Ambulatory Visit (INDEPENDENT_AMBULATORY_CARE_PROVIDER_SITE_OTHER): Payer: 59 | Admitting: Internal Medicine

## 2012-07-21 ENCOUNTER — Other Ambulatory Visit (INDEPENDENT_AMBULATORY_CARE_PROVIDER_SITE_OTHER): Payer: 59

## 2012-07-21 VITALS — BP 130/80 | HR 84 | Temp 97.4°F | Wt 200.0 lb

## 2012-07-21 DIAGNOSIS — E039 Hypothyroidism, unspecified: Secondary | ICD-10-CM

## 2012-07-21 DIAGNOSIS — M6208 Separation of muscle (nontraumatic), other site: Secondary | ICD-10-CM

## 2012-07-21 DIAGNOSIS — Z0000 Encounter for general adult medical examination without abnormal findings: Secondary | ICD-10-CM

## 2012-07-21 DIAGNOSIS — Z125 Encounter for screening for malignant neoplasm of prostate: Secondary | ICD-10-CM

## 2012-07-21 DIAGNOSIS — E663 Overweight: Secondary | ICD-10-CM

## 2012-07-21 DIAGNOSIS — H409 Unspecified glaucoma: Secondary | ICD-10-CM

## 2012-07-21 DIAGNOSIS — Z23 Encounter for immunization: Secondary | ICD-10-CM

## 2012-07-21 HISTORY — DX: Separation of muscle (nontraumatic), other site: M62.08

## 2012-07-21 LAB — HEPATIC FUNCTION PANEL
AST: 24 U/L (ref 0–37)
Alkaline Phosphatase: 114 U/L (ref 39–117)
Bilirubin, Direct: 0.1 mg/dL (ref 0.0–0.3)
Total Bilirubin: 0.7 mg/dL (ref 0.3–1.2)

## 2012-07-21 LAB — LIPID PANEL
HDL: 35.4 mg/dL — ABNORMAL LOW (ref >39.00)
Triglycerides: 84 mg/dL (ref 0.0–149.0)

## 2012-07-21 LAB — COMPREHENSIVE METABOLIC PANEL
BUN: 22 mg/dL (ref 6–23)
CO2: 26 mEq/L (ref 19–32)
Creatinine, Ser: 1 mg/dL (ref 0.4–1.5)
GFR: 78.87 mL/min (ref >60.00)
Glucose, Bld: 100 mg/dL — ABNORMAL HIGH (ref 70–99)
Total Bilirubin: 0.7 mg/dL (ref 0.3–1.2)

## 2012-07-21 LAB — PSA: PSA: 0.3 ng/mL (ref 0.10–4.00)

## 2012-07-21 NOTE — Assessment & Plan Note (Signed)
Assessment: pt denies any sx's of low functioning thyroid (cold intolerance, dry skin, weight gain). Plan: Continue leothyroxine

## 2012-07-21 NOTE — Assessment & Plan Note (Signed)
Assessment: Pt current weight 200 lbs Plan:  - counseled on proper diet and need for a regular exercise regimen - Informed about the Silver Sneakers program and encouraged to look into it

## 2012-07-21 NOTE — Assessment & Plan Note (Signed)
Assessment: well controlled on current therapy Plan: continue omeprazole

## 2012-07-21 NOTE — Assessment & Plan Note (Signed)
Assessment: pt denies eye pain or changes in vision, he has a regular opthamologist Plan: continue current glaucoma therapy

## 2012-07-21 NOTE — Assessment & Plan Note (Addendum)
Interval history w/o major illness, injury or surgery. Physical exam is normal except for being mildly overweight. Lab results in normal range. He is current for colorectal cancer screening. Immunizations - tetanus is brought up to date. Discussed pros and cons of prostate cancer screening (USPHCTF recommendations reviewed and ACU April '13 recommendations) and he requests evaluation at this time: PSA '8, '0, '10, '12 and '14 all in the 0.3 range without change = Negative. Recommend repeat PSA in 2 years.  In summary -  A very nice gentleman who appears to be medically stable. He is encouraged to make exercise his job. He is advised to read "Younger Next Year." Also, recommend addressing the issue of weight: Diet management: smart food choices, PORTION SIZE CONTROL, regular exercise. Goal - to loose 1-2 lbs.month. Target weight - 175 lbs, goal is to loose 1-2 lbs/month to target. He is asked to return in 1 year or sooner as needed.

## 2012-07-21 NOTE — Patient Instructions (Addendum)
Thanks for coming in.  You seem to be stable from a medical perspective. Please remember that taking care of yourself, i.e. Exercising with aerobic and flex stretch exercise is your JOB.  You will receive a full report and you may also access results using MyChart.

## 2012-07-21 NOTE — Progress Notes (Signed)
Subjective:     Patient ID: Jay Holmes, male   DOB: June 13, 1955, 57 y.o.   MRN: [id number]  HPI Mr. Dorner is a 57 yo man with a hx of hypothyroidism that presents for an annual physical exam.  His father recently passed away from prostate cancer.  He was told that he had an abdominal hernia at a recent doctor visit (not this office) for abdominal discomfort.  He has no other complaints.  Past Medical History  Diagnosis Date  . Personal history of diseases of skin and subcutaneous tissue   . Unspecified sinusitis (chronic)   . Personal history of urinary calculi   . Unspecified hypothyroidism   . Glaucoma    Past Surgical History  Procedure Laterality Date  . Tonsillectomy and adenoidectomy     Family History  Problem Relation Age of Onset  . Memory loss Mother   . Hypertension Mother   . Hyperlipidemia Mother   . Cancer Mother     breast cancer  . Cancer Father     prostate  . Diabetes Father     diet controlled  . Cleft lip Son   . Heart disease Maternal Grandmother    History   Social History  . Marital Status: Married    Spouse Name: N/A    Number of Children: N/A  . Years of Education: N/A   Occupational History  . 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 Smoking status: Never Smoker   . Smokeless tobacco: Never Used  . Alcohol Use: No  . Drug Use: No  . Sexually Active: Yes -- Male partner(s)   Other Topics Concern  . Not on file   Social History Narrative   [employer] BA-acctng. 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 1-daughter 1999, 1 son 2003 (cleft lip/palate). wife is healthy.  work: Psychologist, educational at [employer]   Current Outpatient Prescriptions on File Prior to Visit  Medication Sig Dispense Refill  . Ascorbic Acid (VITAMIN C) 1000 MG tablet Take 1,000 mg by mouth daily.        . bimatoprost (LUMIGAN) 0.03 % ophthalmic solution Place 1 drop into both eyes at bedtime.        . Glucosamine-Chondroit-Vit C-Mn (GLUCOSAMINE 1500  COMPLEX) CAPS Take 1 capsule by mouth daily.        [name] levothyroxine (SYNTHROID, LEVOTHROID) 100 MCG tablet Take 1 tablet (100 mcg total) by mouth daily.  90 tablet  3  . Omega-3 Fatty Acids (FISH OIL) 300 MG CAPS Take 1 capsule by mouth daily.        [name] omeprazole (PRILOSEC) 40 MG capsule TAKE 1 CAPSULE DAILY  90 capsule  3   No current facility-administered medications on file prior to visit.   Review of Systems  Constitutional: Negative for fever, chills, diaphoresis, activity change and unexpected weight change.  HENT: Negative for congestion and rhinorrhea.   Respiratory: Negative for cough, chest tightness and shortness of breath.   Cardiovascular: Negative for chest pain and leg swelling.  Gastrointestinal: Negative for nausea, vomiting, abdominal pain, diarrhea and abdominal distention.  Endocrine: Negative for cold intolerance and heat intolerance.  Genitourinary: Negative for dysuria, difficulty urinating and testicular pain.  Musculoskeletal: Positive for arthralgias (left hip pain occassionally).  Skin: Negative for rash.  Neurological: Negative for headaches.  Psychiatric/Behavioral: Negative.       Objective:   Physical Exam  Constitutional: He appears well-developed and well-nourished. No distress.  HENT:  Head:  Normocephalic and atraumatic.  Nose: Nose normal.  Mouth/Throat: Oropharynx is clear and moist.  TMs normal  Eyes: Conjunctivae and EOM are normal. Pupils are equal, round, and reactive to light.  Neck: Neck supple. No JVD present.  Cardiovascular: Normal rate, regular rhythm and normal heart sounds.  Exam reveals no friction rub.   No murmur heard. Pulmonary/Chest: Effort normal and breath sounds normal. He has no wheezes.  Abdominal: Soft. Bowel sounds are normal. He exhibits no distension and no mass. There is no tenderness. Hernia confirmed negative in the right inguinal area and confirmed negative in the left inguinal area. Ventral: small umbilical hernia.   Obese, diastasis recti  Genitourinary: Penis normal.  Musculoskeletal: Normal range of motion. He exhibits no edema.  Lymphadenopathy:    He has no cervical adenopathy.  Neurological: He is alert. He has normal reflexes. No cranial nerve deficit.  Skin: Skin is warm and dry.  Psychiatric: He has a normal mood and affect.            Assessment/Plan:

## 2012-11-29 ENCOUNTER — Other Ambulatory Visit: Payer: Self-pay | Admitting: Dermatology

## 2012-12-06 ENCOUNTER — Telehealth: Payer: Self-pay

## 2012-12-06 NOTE — Telephone Encounter (Signed)
Pt called again states he is working with his Dermatologist on this matter and is waiting to be schedule for additional procedure.  Non action needed from this office.

## 2012-12-06 NOTE — Telephone Encounter (Signed)
I called patient to give results and he states he has not recently had anything removed?? He asks could this be for the wrong patient? I see where he last saw you in March of this year

## 2012-12-06 NOTE — Telephone Encounter (Signed)
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 But changes are at the margin and wider excision is required. Will need OV for wider excision - please schedule. ------

## 2012-12-06 NOTE — Telephone Encounter (Signed)
Left message for patient to return call.

## 2012-12-06 NOTE — Telephone Encounter (Signed)
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 But changes are at the margin and wider excision is required. Will need OV for wider excision - please schedule. ------

## 2012-12-17 ENCOUNTER — Encounter: Payer: Self-pay | Admitting: Internal Medicine

## 2012-12-17 ENCOUNTER — Ambulatory Visit (INDEPENDENT_AMBULATORY_CARE_PROVIDER_SITE_OTHER): Payer: 59 | Admitting: Internal Medicine

## 2012-12-17 ENCOUNTER — Telehealth: Payer: Self-pay | Admitting: Internal Medicine

## 2012-12-17 VITALS — BP 112/82 | HR 74 | Temp 98.6°F

## 2012-12-17 DIAGNOSIS — L255 Unspecified contact dermatitis due to plants, except food: Secondary | ICD-10-CM

## 2012-12-17 MED ORDER — PREDNISONE 10 MG PO TABS: ORAL_TABLET | ORAL | Status: DC

## 2012-12-17 NOTE — Telephone Encounter (Signed)
Patient Information:  Caller Name: Finnick  Phone: [phone]  Patient: Jay Holmes, Mallon  Gender: Male  DOB: July 20, 1955  Age: 57 Years  PCP: Illene Regulus (Adults only)  Office Follow Up:  Does the office need to follow up with this patient?: No  Instructions For The Office: N/A   Symptoms  Reason For Call & Symptoms: Pt is calling and states that he has poison ivy and would like a Rx for poison ivy since when he gets it it spreads very easily; sx started 12/14/12; pt states that he as out in the weeds;  rash is located on  left hand and right wrist; rash is red bumps; pt is very familiar with posion ivy since he gets frequently; not sleeping well from itching  Reviewed Health History In EMR: Yes  Reviewed Medications In EMR: Yes  Reviewed Allergies In EMR: Yes  Reviewed Surgeries / Procedures: Yes  Date of Onset of Symptoms: 12/14/2012  Treatments Tried: Itch-X  Treatments Tried Worked: No  Guideline(s) Used:  Poison Ivy - [facility] or [facility]  Disposition Per Guideline:   See Today in Office  Reason For Disposition Reached:   Severe itching interferes with normal activities (e.g., work or school) or prevents sleep  Advice Given:  Call Back If:  It looks infected  You become worse.  Patient Will Follow Care Advice:  YES  Appointment Scheduled:  12/17/2012 13:00:00 Appointment Scheduled Provider:  Nicki Reaper

## 2012-12-17 NOTE — Progress Notes (Signed)
Subjective:    Patient ID: Jay Holmes, male    DOB: [birth year], 57 y.o.   MRN: [id number]  HPI  Pt presents to the clinic today with c/o a rash on his right and left wrist/hand. He thinks it may be poison ivy.  This started 3 days ago. He has exposure when he was working outside pulling weeds. It does itch. He has not put anything on it OTC. He has had a rash like this before.  Review of Systems  Past Medical History  Diagnosis Date  . Personal history of diseases of skin and subcutaneous tissue   . Unspecified sinusitis (chronic)   . Personal history of urinary calculi   . Unspecified hypothyroidism   . Glaucoma   . Diastasis recti 07/21/2012    questionable umbilical hernia    Current Outpatient Prescriptions  Medication Sig Dispense Refill  . Ascorbic Acid (VITAMIN C) 1000 MG tablet Take 1,000 mg by mouth daily.        . bimatoprost (LUMIGAN) 0.03 % ophthalmic solution Place 1 drop into both eyes at bedtime.        . Glucosamine-Chondroit-Vit C-Mn (GLUCOSAMINE 1500 COMPLEX) CAPS Take 1 capsule by mouth daily.        [name] levothyroxine (SYNTHROID, LEVOTHROID) 100 MCG tablet Take 1 tablet (100 mcg total) by mouth daily.  90 tablet  3  . Omega-3 Fatty Acids (FISH OIL) 300 MG CAPS Take 1 capsule by mouth daily.        [name] omeprazole (PRILOSEC) 40 MG capsule TAKE 1 CAPSULE DAILY  90 capsule  3   No current facility-administered medications for this visit.    No Known Allergies  Family History  Problem Relation Age of Onset  . Memory loss Mother   . Hypertension Mother   . Hyperlipidemia Mother   . Cancer Mother     breast cancer  . Cancer Father     prostate  . Diabetes Father     diet controlled  . Cleft lip Son   . Heart disease Maternal Grandmother     History   Social History  . Marital Status: Married    Spouse Name: N/A    Number of Children: N/A  . Years of Education: N/A   Occupational History  . 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 Smoking status: Never Smoker   . Smokeless tobacco: Never Used  . Alcohol Use: No  . Drug Use: No  . Sexually Active: Yes -- Male partner(s)   Other Topics Concern  . Not on file   Social History Narrative   [employer] BA-acctng. 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 1-daughter 1999, 1 son 2003 (cleft lip/palate). wife is healthy.  work: Psychologist, educational at [employer]     Constitutional: Denies fever, malaise, fatigue, headache or abrupt weight changes.  Respiratory: Denies difficulty breathing, shortness of breath, cough or sputum production.   Cardiovascular: Denies chest pain, chest tightness, palpitations or swelling in the hands or feet.   Skin: Pt reports rash on hand/wrist. Denies redness, lesions or ulcercations.    No other specific complaints in a complete review of systems (except as listed in HPI above).     Objective:   Physical Exam   BP 112/82  Pulse 74  Temp(Src) 98.6 F (37 C) (Oral)  SpO2 97% Wt Readings from Last 3 Encounters:  07/21/12 200 lb (90.719 kg)  12/29/11 198 lb 12 oz (90.152  kg)  04/04/11 194 lb 1.3 oz (88.034 kg)    General: Appears his stated age, well developed, well nourished in NAD. Skin: Warm, dry and intact. vesicular lesions noted on left hand and right hand/wrist Cardiovascular: Normal rate and rhythm. S1,S2 noted.  No murmur, rubs or gallops noted. No JVD or BLE edema. No carotid bruits noted. Pulmonary/Chest: Normal effort and positive vesicular breath sounds. No respiratory distress. No wheezes, rales or ronchi noted.     BMET    Component Value Date/Time   NA 140 07/21/2012 1026   K 4.1 07/21/2012 1026   CL 108 07/21/2012 1026   CO2 26 07/21/2012 1026   GLUCOSE 100* 07/21/2012 1026   BUN 22 07/21/2012 1026   CREATININE 1.0 07/21/2012 1026   CALCIUM 9.1 07/21/2012 1026   GFRNONAA 82.49 07/16/2009 1007   GFRAA 114 04/11/2008 1042    Lipid Panel     Component Value Date/Time   CHOL 161 07/21/2012 1026   TRIG  84.0 07/21/2012 1026   HDL 35.40* 07/21/2012 1026   CHOLHDL 5 07/21/2012 1026   VLDL 16.8 07/21/2012 1026   LDLCALC 109* 07/21/2012 1026    CBC    Component Value Date/Time   WBC 5.8 10/02/2010 0905   RBC 4.82 10/02/2010 0905   HGB 15.3 10/02/2010 0905   HCT 45.5 10/02/2010 0905   PLT 211.0 10/02/2010 0905   MCV 94.4 10/02/2010 0905   MCHC 33.7 10/02/2010 0905   RDW 13.8 10/02/2010 0905   LYMPHSABS 2.1 10/02/2010 0905   MONOABS 0.6 10/02/2010 0905   EOSABS 0.1 10/02/2010 0905   BASOSABS 0.0 10/02/2010 0905    Hgb A1C No results found for this basename: HGBA1C        Assessment & Plan:   Contact dermatitis of hands/wrist secondary to plants:  eRx for prednisone taper Benadryl as needed for itching  RTC as needed or if symptoms persist or worsen

## 2012-12-17 NOTE — Patient Instructions (Signed)
Poison Ivy Poison ivy is a inflammation of the skin (contact dermatitis) caused by touching the allergens on the leaves of the ivy plant following previous exposure to the plant. The rash usually appears 48 hours after exposure. The rash is usually bumps (papules) or blisters (vesicles) in a linear pattern. Depending on your own sensitivity, the rash may simply cause redness and itching, or it may also progress to blisters which may break open. These must be well cared for to prevent secondary bacterial (germ) infection, followed by scarring. Keep any open areas dry, clean, dressed, and covered with an antibacterial ointment if needed. The eyes may also get puffy. The puffiness is worst in the morning and gets better as the day progresses. This dermatitis usually heals without scarring, within 2 to 3 weeks without treatment. HOME CARE INSTRUCTIONS  Thoroughly wash with soap and water as soon as you have been exposed to poison ivy. You have about one half hour to remove the plant resin before it will cause the rash. This washing will destroy the oil or antigen on the skin that is causing, or will cause, the rash. Be sure to wash under your fingernails as any plant resin there will continue to spread the rash. Do not rub skin vigorously when washing affected area. Poison ivy cannot spread if no oil from the plant remains on your body. A rash that has progressed to weeping sores will not spread the rash unless you have not washed thoroughly. It is also important to wash any clothes you have been wearing as these may carry active allergens. The rash will return if you wear the unwashed clothing, even several days later. Avoidance of the plant in the future is the best measure. Poison ivy plant can be recognized by the number of leaves. Generally, poison ivy has three leaves with flowering branches on a single stem. Diphenhydramine may be purchased over the counter and used as needed for itching. Do not drive with  this medication if it makes you drowsy.Ask your caregiver about medication for children. SEEK MEDICAL CARE IF:  Open sores develop.  Redness spreads beyond area of rash.  You notice purulent (pus-like) discharge.  You have increased pain.  Other signs of infection develop (such as fever). Document Released: 04/25/2000 Document Revised: 07/21/2011 Document Reviewed: 03/14/2009 ExitCare Patient Information 2014 [employer].  

## 2012-12-24 ENCOUNTER — Telehealth: Payer: Self-pay

## 2012-12-24 DIAGNOSIS — L255 Unspecified contact dermatitis due to plants, except food: Secondary | ICD-10-CM

## 2012-12-24 MED ORDER — PREDNISONE 10 MG PO TABS: ORAL_TABLET | ORAL | Status: DC

## 2012-12-24 NOTE — Telephone Encounter (Signed)
Repeat pred pak ordered - erx done

## 2012-12-24 NOTE — Telephone Encounter (Signed)
Phone call from patient's wife stating he saw Rene Kocher 12/17/12 for poison ivy. He's finished the prednisone but it has not completely cleared up. [facility] uses [facility] [address]. Requesting another dose. Please advise. Thanks.

## 2012-12-27 ENCOUNTER — Telehealth: Payer: Self-pay | Admitting: *Deleted

## 2012-12-27 MED ORDER — PREDNISONE 10 MG PO TABS: ORAL_TABLET | ORAL | Status: DC

## 2012-12-27 NOTE — Telephone Encounter (Signed)
[facility] Triage Call Report Triage Record Num: [id number] Operator: Baldomero Lamy Patient Name: Jay Holmes Call Date & Time: 12/25/2012 12:34:17PM Patient Phone: [phone] PCP: Illene Regulus Patient Gender: Male PCP Fax : [phone] Patient DOB: 09/21/1955 [facility] Name: [facility] Reason for Call: Caller: Ellin Goodie; PCP: Illene Regulus; CB#: [phone]; Call regarding ; Wife calling regarding repeat Prednisone pack not received by [facility] [place]. Verified in Epic. Called in verbally to same: [phone]; Harrold Donath, Rph. Protocol(s) Used: Medication Questions - Adult Recommended Outcome per Protocol: Provided Health Information Reason for Outcome: Caller has medication question(s) that was answered with available resources Care Advice: ~

## 2013-01-11 ENCOUNTER — Other Ambulatory Visit: Payer: Self-pay | Admitting: Dermatology

## 2013-02-25 ENCOUNTER — Other Ambulatory Visit: Payer: Self-pay | Admitting: Internal Medicine

## 2013-03-23 ENCOUNTER — Ambulatory Visit (INDEPENDENT_AMBULATORY_CARE_PROVIDER_SITE_OTHER): Payer: 59

## 2013-03-23 DIAGNOSIS — Z23 Encounter for immunization: Secondary | ICD-10-CM

## 2013-04-29 ENCOUNTER — Other Ambulatory Visit: Payer: Self-pay | Admitting: Internal Medicine

## 2013-06-01 ENCOUNTER — Other Ambulatory Visit: Payer: Self-pay | Admitting: Dermatology

## 2013-08-04 ENCOUNTER — Ambulatory Visit (INDEPENDENT_AMBULATORY_CARE_PROVIDER_SITE_OTHER): Payer: 59 | Admitting: Internal Medicine

## 2013-08-04 ENCOUNTER — Encounter: Payer: Self-pay | Admitting: Internal Medicine

## 2013-08-04 VITALS — BP 118/80 | HR 80 | Temp 98.2°F | Wt 197.0 lb

## 2013-08-04 DIAGNOSIS — R142 Eructation: Secondary | ICD-10-CM

## 2013-08-04 DIAGNOSIS — R141 Gas pain: Secondary | ICD-10-CM

## 2013-08-04 DIAGNOSIS — R143 Flatulence: Secondary | ICD-10-CM

## 2013-08-04 DIAGNOSIS — R140 Abdominal distension (gaseous): Secondary | ICD-10-CM

## 2013-08-04 MED ORDER — HYOSCYAMINE SULFATE 0.125 MG SL SUBL: 0.1250 mg | SUBLINGUAL_TABLET | SUBLINGUAL | Status: DC | PRN

## 2013-08-04 NOTE — Progress Notes (Signed)
Pre visit review using our clinic review tool, if applicable. No additional management support is needed unless otherwise documented below in the visit note. 

## 2013-08-04 NOTE — Progress Notes (Signed)
Subjective:    Patient ID: Jay Holmes, male    DOB: 01/27/1956, 58 y.o.   MRN: [id number]  HPI Demarquis is enjoying his retirement.   He presents with c/o several months of having a very tender stomach. Often times he will feel tight and bloated, he will have borborygmi. Bowel habit is fairly regular.  Past Medical History  Diagnosis Date  . Personal history of diseases of skin and subcutaneous tissue   . Unspecified sinusitis (chronic)   . Personal history of urinary calculi   . Unspecified hypothyroidism   . Glaucoma   . Diastasis recti 07/21/2012    questionable umbilical hernia   Past Surgical History  Procedure Laterality Date  . Tonsillectomy and adenoidectomy     Family History  Problem Relation Age of Onset  . Memory loss Mother   . Hypertension Mother   . Hyperlipidemia Mother   . Cancer Mother     breast cancer  . Cancer Father     prostate  . Diabetes Father     diet controlled  . Cleft lip Son   . Heart disease Maternal Grandmother    History   Social History  . Marital Status: Married    Spouse Name: N/A    Number of Children: N/A  . Years of Education: N/A   Occupational History  . Civil engineer, contracting - retired [employer]   Social History Main Topics  . Smoking status: Never Smoker   . Smokeless tobacco: Never Used  . Alcohol Use: No  . Drug Use: No  . Sexual Activity: Yes    Partners: Female   Other Topics Concern  . Not on file   Social History Narrative   [employer] BA-ac[employer]ng. married - [place], 1 son 2003 (cleft lip/palate). wife is healthy.  work: Psychologist, occupational at [employer] Prescriptions on File Prior to Visit  Medication Sig Dispense Refill  . Ascorbic Acid (VITAMIN C) 1000 MG tablet Take 1,000 mg by mouth daily.        . bimatoprost (LUMIGAN) 0.03 % ophthalmic solution Place 1 drop into both eyes at bedtime.        . Glucosamine-Chondroit-Vit C-Mn (GLUCOSAMINE 1500  COMPLEX) CAPS Take 1 capsule by mouth daily.        [name] levothyroxine (SYNTHROID, LEVOTHROID) 100 MCG tablet Take 1 tablet (100 mcg  total) by mouth daily.  90 tablet  3  . Omega-3 Fatty Acids (FISH OIL) 300 MG CAPS Take 1 capsule by mouth daily.        [name] omeprazole (PRILOSEC) 40 MG capsule Take 1 capsule by mouth  daily  90 capsule  1  . predniSONE (DELTASONE) 10 MG tablet Take 3 tablets on days 1-2, take 2 tablets on days 3-4, take 1 tablet on days 5-6  12 tablet  0   No current facility-administered medications on file prior to visit.      Review of Systems System review is negative for any constitutional, cardiac, pulmonary, GI or neuro symptoms or complaints other than as described in the HPI.     Objective:   Physical Exam Filed Vitals:   08/04/13 1108  BP: 118/80  Pulse: 80  Temp: 98.2 F (36.8 C)   Wt Readings from Last 3 Encounters:  08/04/13 197 lb (89.359 kg)  07/21/12 200 lb (90.719 kg)  12/29/11 198 lb 12 oz (90.152 kg)   BP Readings from Last  3 Encounters:  08/04/13 118/80  12/17/12 112/82  07/21/12 130/80   Gen'l - WNWD man in no distress Cor - RRR Pul Normal respirations Neuro - A&O x 3       Assessment & Plan:  Bloating - symptoms are very consistent with irritable bowel symdrome - IBS, AKA spastic colon. This is due to disordered peristalsis - the coordinated and rhythmic movement of the gut. (see AVS)  Primary treatment is diet: plenty of fiber and less refined foods. For the acute set of symptoms of bloating, gas and discomfort you can take hyoscamine sl (actually you chew it fine and swallow). For chronic symptoms there are several long acting medications that can be taken daily.  Plan Handout for info (AVS)  Rx Hyoscamine SL  Follow up for uncontrolled symptoms.

## 2013-08-04 NOTE — Patient Instructions (Signed)
Good to see you and to hear that you are doing well in the main.   Your symptoms are very consistent with irritable bowel symdrome - IBS, AKA spastic colon. This is due to disordered peristalsis - the coordinated and rhythmic movement of the gut. (see below)  Primary treatment is diet: plenty of fiber and less refined foods. For the acute set of symptoms of bloating, gas and discomfort you can take hyoscamine sl (actually you chew it fine and swallow). For chronic symptoms there are several long acting medications that can be taken daily.  Plan Handout for info  Rx Hyoscamine SL  Follow up for uncontrolled symptoms.    Irritable Bowel Syndrome Irritable Bowel Syndrome (IBS) is caused by a disturbance of normal bowel function. Other terms used are spastic colon, mucous colitis, and irritable colon. It does not require surgery, nor does it lead to cancer. There is no cure for IBS. But with proper diet, stress reduction, and medication, you will find that your problems (symptoms) will gradually disappear or improve. IBS is a common digestive disorder. It usually appears in late adolescence or early adulthood. Women develop it twice as often as men. CAUSES  After food has been digested and absorbed in the small intestine, waste material is moved into the colon (large intestine). In the colon, water and salts are absorbed from the undigested products coming from the small intestine. The remaining residue, or fecal material, is held for elimination. Under normal circumstances, gentle, rhythmic contractions on the bowel walls push the fecal material along the colon towards the rectum. In IBS, however, these contractions are irregular and poorly coordinated. The fecal material is either retained too long, resulting in constipation, or expelled too soon, producing diarrhea. SYMPTOMS  The most common symptom of IBS is pain. It is typically in the lower left side of the belly (abdomen). But it may occur  anywhere in the abdomen. It can be felt as heartburn, backache, or even as a dull pain in the arms or shoulders. The pain comes from excessive bowel-muscle spasms and from the buildup of gas and fecal material in the colon. This pain:  Can range from sharp belly (abdominal) cramps to a dull, continuous ache.  Usually worsens soon after eating.  Is typically relieved by having a bowel movement or passing gas. Abdominal pain is usually accompanied by constipation. But it may also produce diarrhea. The diarrhea typically occurs right after a meal or upon arising in the morning. The stools are typically soft and watery. They are often flecked with secretions (mucus). Other symptoms of IBS include:  Bloating.  Loss of appetite.  Heartburn.  Feeling sick to your stomach (nausea).  Belching  Vomiting  Gas. IBS may also cause a number of symptoms that are unrelated to the digestive system:  Fatigue.  Headaches.  Anxiety  Shortness of breath  Difficulty in concentrating.  Dizziness. These symptoms tend to come and go. DIAGNOSIS  The symptoms of IBS closely mimic the symptoms of other, more serious digestive disorders. So your caregiver may wish to perform a variety of additional tests to exclude these disorders. He/she wants to be certain of learning what is wrong (diagnosis). The nature and purpose of each test will be explained to you. TREATMENT A number of medications are available to help correct bowel function and/or relieve bowel spasms and abdominal pain. Among the drugs available are:  Mild, non-irritating laxatives for severe constipation and to help restore normal bowel habits.  Specific anti-diarrheal  medications to treat severe or prolonged diarrhea.  Anti-spasmodic agents to relieve intestinal cramps.  Your caregiver may also decide to treat you with a mild tranquilizer or sedative during unusually stressful periods in your life. The important thing to remember is  that if any drug is prescribed for you, make sure that you take it exactly as directed. Make sure that your caregiver knows how well it worked for you. HOME CARE INSTRUCTIONS   Avoid foods that are high in fat or oils. Some examples GHW:EXHBZ cream, butter, frankfurters, sausage, and other fatty meats.  Avoid foods that have a laxative effect, such as fruit, fruit juice, and dairy products.  Cut out carbonated drinks, chewing gum, and "gassy" foods, such as beans and cabbage. This may help relieve bloating and belching.  Bran taken with plenty of liquids may help relieve constipation.  Keep track of what foods seem to trigger your symptoms.  Avoid emotionally charged situations or circumstances that produce anxiety.  Start or continue exercising.  Get plenty of rest and sleep. MAKE SURE YOU:   Understand these instructions.  Will watch your condition.  Will get help right away if you are not doing well or get worse. Document Released: 04/28/2005 Document Revised: 07/21/2011 Document Reviewed: 12/17/2007 [facility] Patient Information 2014 [place].    Diet and Irritable Bowel Syndrome  No cure has been found for irritable bowel syndrome (IBS). Many options are available to treat the symptoms. Your caregiver will give you the best treatments available for your symptoms. He or she will also encourage you to manage stress and to make changes to your diet. You need to work with your caregiver and Registered Dietician to find the best combination of medicine, diet, counseling, and support to control your symptoms. The following are some diet suggestions. FOODS THAT MAKE IBS WORSE  Fatty foods, such as [place] fries.  Milk products, such as cheese or ice cream.  Chocolate.  Alcohol.  Caffeine (found in coffee and some sodas).  Carbonated drinks, such as soda. If certain foods cause symptoms, you should eat less of them or stop eating them. FOOD JOURNAL   Keep a journal  of the foods that seem to cause distress. Write down:  What you are eating during the day and when.  What problems you are having after eating.  When the symptoms occur in relation to your meals.  What foods always make you feel badly.  Take your notes with you to your caregiver to see if you should stop eating certain foods. FOODS THAT MAKE IBS BETTER Fiber reduces IBS symptoms, especially constipation, because it makes stools soft, bulky, and easier to pass. Fiber is found in bran, bread, cereal, beans, fruit, and vegetables. Examples of foods with fiber include:  Apples.  Peaches.  Pears.  Berries.  Figs.  Broccoli, raw.  Cabbage.  Carrots.  Raw peas.  Kidney beans.  Lima beans.  Whole-grain bread.  Whole-grain cereal. Add foods with fiber to your diet a little at a time. This will let your body get used to them. Too much fiber at once might cause gas and swelling of your abdomen. This can trigger symptoms in a person with IBS. Caregivers usually recommend a diet with enough fiber to produce soft, painless bowel movements. High fiber diets may cause gas and bloating. However, these symptoms often go away within a few weeks, as your body adjusts. In many cases, dietary fiber may lessen IBS symptoms, particularly constipation. However, it may not help  pain or diarrhea. High fiber diets keep the colon mildly enlarged (distended) with the added fiber. This may help prevent spasms in the colon. Some forms of fiber also keep water in the stool, thereby preventing hard stools that are difficult to pass.  Besides telling you to eat more foods with fiber, your caregiver may also tell you to get more fiber by taking a fiber pill or drinking water mixed with a special high fiber powder. An example of this is a natural fiber laxative containing psyllium seed.  TIPS  Large meals can cause cramping and diarrhea in people with IBS. If this happens to you, try eating 4 or 5 small meals  a day, or try eating less at each of your usual 3 meals. It may also help if your meals are low in fat and high in carbohydrates. Examples of carbohydrates are pasta, rice, whole-grain breads and cereals, fruits, and vegetables.  If dairy products cause your symptoms to flare up, you can try eating less of those foods. You might be able to handle yogurt better than other dairy products, because it contains bacteria that helps with digestion. Dairy products are an important source of calcium and other nutrients. If you need to avoid dairy products, be sure to talk with a Registered Dietitian about getting these nutrients through other food sources.  Drink enough water and fluids to keep your urine clear or pale yellow. This is important, especially if you have diarrhea. FOR MORE INFORMATION  International Foundation for Functional Gastrointestinal Disorders: www.iffgd.org  National Digestive Diseases Information Clearinghouse: digestive.AmenCredit.is Document Released: 07/19/2003 Document Revised: 07/21/2011 Document Reviewed: 04/05/2007 [facility] Patient Information 2014 [place], [place].

## 2013-08-05 ENCOUNTER — Other Ambulatory Visit: Payer: Self-pay

## 2013-08-05 ENCOUNTER — Other Ambulatory Visit: Payer: Self-pay | Admitting: Internal Medicine

## 2013-08-05 MED ORDER — OMEPRAZOLE 40 MG PO CPDR: DELAYED_RELEASE_CAPSULE | ORAL | Status: DC

## 2013-08-13 ENCOUNTER — Encounter: Payer: Self-pay | Admitting: Family Medicine

## 2013-08-13 ENCOUNTER — Ambulatory Visit (INDEPENDENT_AMBULATORY_CARE_PROVIDER_SITE_OTHER): Payer: 59 | Admitting: Family Medicine

## 2013-08-13 VITALS — BP 110/80 | Temp 97.7°F | Wt 195.0 lb

## 2013-08-13 DIAGNOSIS — S30861A Insect bite (nonvenomous) of abdominal wall, initial encounter: Secondary | ICD-10-CM | POA: Insufficient documentation

## 2013-08-13 DIAGNOSIS — S30860A Insect bite (nonvenomous) of lower back and pelvis, initial encounter: Secondary | ICD-10-CM

## 2013-08-13 DIAGNOSIS — W57XXXA Bitten or stung by nonvenomous insect and other nonvenomous arthropods, initial encounter: Principal | ICD-10-CM

## 2013-08-13 MED ORDER — DOXYCYCLINE HYCLATE 100 MG PO TABS: 100.0000 mg | ORAL_TABLET | Freq: Two times a day (BID) | ORAL | Status: DC

## 2013-08-13 NOTE — Progress Notes (Signed)
   Subjective:    Patient ID: Jay Holmes, male    DOB: 1956-04-20, 58 y.o.   MRN: [id number]  HPI Tick bite- noted on Thursday.  R abdomen.  Tick was embedded but not engorged.  Wife removed w/ wet paper towel- came out cleanly w/ no retained parts.  No pain, drainage/oozing.  No fevers or HAs.     Review of Systems For ROS see HPI     Objective:   Physical Exam  Vitals reviewed. Constitutional: He appears well-developed and well-nourished. No distress.  Abdominal: Soft. There is no tenderness.  Skin: Skin is warm and dry. There is erythema (tick bite on R lower abd w/o obvious drainage but surrounding erythema and induration).          Assessment & Plan:

## 2013-08-13 NOTE — Patient Instructions (Signed)
Follow up as needed Start the Doxycycline twice daily- take w/ food Apply hydrocortisone cream as needed for itching Call with any questions or concerns Hang in there! Happy Ivor Costa!

## 2013-08-13 NOTE — Progress Notes (Signed)
Pre visit review using our clinic review tool, if applicable. No additional management support is needed unless otherwise documented below in the visit note. 

## 2013-08-13 NOTE — Assessment & Plan Note (Signed)
New.  Appears to have surrounding are of cellulitis.  Due to fact it's a tick bite, will tx w/ Doxy rather than Keflex to cover possible RMSF or Lyme.  Reviewed supportive care and red flags that should prompt return.  Pt expressed understanding and is in agreement w/ plan.

## 2014-03-10 ENCOUNTER — Ambulatory Visit (INDEPENDENT_AMBULATORY_CARE_PROVIDER_SITE_OTHER): Payer: 59

## 2014-03-10 DIAGNOSIS — Z23 Encounter for immunization: Secondary | ICD-10-CM

## 2014-03-16 ENCOUNTER — Encounter: Payer: Self-pay | Admitting: Family

## 2014-03-16 ENCOUNTER — Ambulatory Visit (INDEPENDENT_AMBULATORY_CARE_PROVIDER_SITE_OTHER): Payer: 59 | Admitting: Family

## 2014-03-16 VITALS — BP 110/84 | HR 82 | Temp 98.4°F | Resp 18 | Ht 69.0 in | Wt 195.6 lb

## 2014-03-16 DIAGNOSIS — H409 Unspecified glaucoma: Secondary | ICD-10-CM

## 2014-03-16 DIAGNOSIS — K429 Umbilical hernia without obstruction or gangrene: Secondary | ICD-10-CM | POA: Insufficient documentation

## 2014-03-16 DIAGNOSIS — Z0000 Encounter for general adult medical examination without abnormal findings: Secondary | ICD-10-CM

## 2014-03-16 DIAGNOSIS — K219 Gastro-esophageal reflux disease without esophagitis: Secondary | ICD-10-CM

## 2014-03-16 DIAGNOSIS — E039 Hypothyroidism, unspecified: Secondary | ICD-10-CM

## 2014-03-16 NOTE — Progress Notes (Signed)
Subjective:    Patient ID: Jay Holmes, male    DOB: Aug 11, 1955, 58 y.o.   MRN: [id number]  Chief Complaint  Patient presents with  . CPE    HPI:  Jay Holmes is a 58 y.o. male who presents today for an annual wellness visit. He is a new patient to this provider.   1) Health Maintenance -  No major changes that he is aware of. Was working on dropping some weight. Had lost 10 pounds but it came back.   Diet -  Exercise -  Wt Readings from Last 3 Encounters:  03/16/14 195 lb 9.6 oz (88.724 kg)  08/13/13 195 lb (88.451 kg)  08/04/13 197 lb (89.359 kg)   2) Preventative Exams / Immunizations:  Dental -- Up to date Vision -- Up to date Immunizations -- Flu shot up to date; Tetanus shot up to date Colonoscopy -- Up to date   No Known Allergies  Current Outpatient Prescriptions on File Prior to Visit  Medication Sig Dispense Refill  . Ascorbic Acid (VITAMIN C) 1000 MG tablet Take 1,000 mg by mouth daily.      . Glucosamine-Chondroit-Vit C-Mn (GLUCOSAMINE 1500 COMPLEX) CAPS Take 1 capsule by mouth daily.      [name] levothyroxine (SYNTHROID, LEVOTHROID) 100 MCG tablet Take 1 tablet (100 mcg  total) by mouth daily. 90 tablet 3  . Omega-3 Fatty Acids (FISH OIL) 300 MG CAPS Take 1 capsule by mouth daily.      [name] omeprazole (PRILOSEC) 40 MG capsule Take 1 capsule by mouth  daily 30 capsule 0  . bimatoprost (LUMIGAN) 0.03 % ophthalmic solution Place 1 drop into both eyes at bedtime.       No current facility-administered medications on file prior to visit.   Current Problems:  1) Hypothyroidism - Currently well maintained on levothyroxine. Denies any adverse symptoms.    Lab Results  Component Value Date   TSH 3.99 07/21/2012   2) Glaucoma - being managed by Dr. Katy Fitch and is stable.  3) GERD - reflux is maintained well with omeprazole. Denies any adverse effects.   Review of Systems  Constitutional: Denies fever, chills, fatigue, or significant weight  gain/loss. HENT: Head: Denies headache or neck pain Ears: Denies changes in hearing, ringing in ears, earache, drainage Nose: Denies discharge, stuffiness, itching, nosebleed, sinus pain Throat: Denies sore throat, hoarseness, dry mouth, sores, thrush Eyes: Denies loss/changes in vision, pain, redness, blurry/double vision, flashing lights Cardiovascular: Denies chest pain/discomfort, tightness, palpitations, shortness of breath with activity, difficulty lying down, swelling, sudden awakening with shortness of breath Respiratory: Denies shortness of breath, cough, sputum production, wheezing Gastrointestinal: Denies dysphasia change in appetite, nausea, change in bowel habits, rectal bleeding, constipation, diarrhea, yellow skin or eyes Genitourinary: Denies frequency, urgency, burning/pain, blood in urine, incontinence, change in urinary strength. Musculoskeletal: Denies muscle/joint pain, stiffness, back pain, redness or swelling of joints, trauma Notes arthritis in the back on occasion - managed by Dr. Durward Fortes. Hernia noted at previous wellness visit. Has been noticing it more recently. Denies any changes to the hernia itself or any pain.  Skin: Denies rashes, lumps, itching, dryness, color changes, or hair/nail changes Followed by dermatology every 4-6 months Neurological: Denies dizziness, fainting, seizures, weakness, numbness, tingling, tremor Psychiatric - Denies nervousness, stress, depression or memory loss Endocrine: Denies heat or cold intolerance, sweating, frequent urination, excessive thirst, changes in appetite Hematologic: Denies ease of bruising or bleeding    Objective:    BP 110/84 mmHg  Pulse 82  Temp(Src) 98.4 F (36.9 C) (Oral)  Resp 18  Ht 5\' 9"  (1.753 m)  Wt 195 lb 9.6 oz (88.724 kg)  BMI 28.87 kg/m2  SpO2 98% Nursing note and vital signs reviewed.  Physical Exam  Constitutional: He is oriented to person, place, and time. He appears well-developed and  well-nourished.  HENT:  Head: Normocephalic.  Right Ear: Hearing, tympanic membrane, external ear and ear canal normal.  Left Ear: Hearing, tympanic membrane, external ear and ear canal normal.  Nose: Nose normal.  Mouth/Throat: Uvula is midline, oropharynx is clear and moist and mucous membranes are normal.  Eyes: Conjunctivae and EOM are normal. Pupils are equal, round, and reactive to light.  Neck: Neck supple. No JVD present. No tracheal deviation present. No thyromegaly present.  Cardiovascular: Normal rate, regular rhythm, normal heart sounds and intact distal pulses.   Pulmonary/Chest: Effort normal and breath sounds normal.  Abdominal: Soft. Bowel sounds are normal. He exhibits no distension and no mass. There is no tenderness. There is no rebound and no guarding. A hernia is present.  Musculoskeletal: Normal range of motion. He exhibits no edema or tenderness.  Lymphadenopathy:    He has no cervical adenopathy.  Neurological: He is alert and oriented to person, place, and time. He has normal reflexes. No cranial nerve deficit. He exhibits normal muscle tone. Coordination normal.  Skin: Skin is warm and dry.  Psychiatric: He has a normal mood and affect. His behavior is normal. Judgment and thought content normal.       Assessment & Plan:

## 2014-03-16 NOTE — Assessment & Plan Note (Signed)
1) Anticipatory Guidance: Discussed importance of wearing a seatbelt while driving and not texting while driving; changing batteries in smoke detector at least once annually; wearing suntan lotion when outside; eating a balanced and moderate diet; getting physical activity at least 30 minutes per day.  2) Immunizations / Screenings / Labs:  Flu and tetanus are up to date. / Screenings are up to date / Check CBC, BMET, lipid profile and TSH.  Overall well exam. Goal is to lose 5-10% of body weight over the next year. Follow up following lab work or sooner if needed.

## 2014-03-16 NOTE — Assessment & Plan Note (Signed)
Noted during a previous exam. Is causing patient some anxiety. Refer to general surgery for any surgical intervention.

## 2014-03-16 NOTE — Progress Notes (Signed)
Pre visit review using our clinic review tool, if applicable. No additional management support is needed unless otherwise documented below in the visit note. 

## 2014-03-16 NOTE — Assessment & Plan Note (Addendum)
Has been stable for many years at current dose. Recheck TSH. Continue levothyroxine at 10 mcg per day.

## 2014-03-16 NOTE — Patient Instructions (Signed)
Thank you for choosing [facility].  Summary/Instructions:   You have completed your physical  Please stop by the lab at your convenience for your blood work  Follow up in a year or sooner if needed.  Try to make small changes in your meals / snacks like adding a fruit/vegetable and protein.  You should be hear back in about week regarding an appointment for a surgeon.

## 2014-03-16 NOTE — Assessment & Plan Note (Addendum)
Currently stable and managed by Dr. Katy Fitch.

## 2014-03-16 NOTE — Assessment & Plan Note (Addendum)
Currently stable with no exacerbations. Continue omeprazole 40 mg daily.

## 2014-03-17 ENCOUNTER — Other Ambulatory Visit (INDEPENDENT_AMBULATORY_CARE_PROVIDER_SITE_OTHER): Payer: 59

## 2014-03-17 DIAGNOSIS — E039 Hypothyroidism, unspecified: Secondary | ICD-10-CM

## 2014-03-17 DIAGNOSIS — K219 Gastro-esophageal reflux disease without esophagitis: Secondary | ICD-10-CM

## 2014-03-17 LAB — CBC
HCT: 48.5 % (ref 39.0–52.0)
HEMOGLOBIN: 16.1 g/dL (ref 13.0–17.0)
MCHC: 33.2 g/dL (ref 30.0–36.0)
MCV: 92.7 fl (ref 78.0–100.0)
Platelets: 218 10*3/uL (ref 150.0–400.0)
RBC: 5.23 Mil/uL (ref 4.22–5.81)
RDW: 13.7 % (ref 11.5–15.5)
WBC: 5.9 10*3/uL (ref 4.0–10.5)

## 2014-03-17 LAB — BASIC METABOLIC PANEL
BUN: 21 mg/dL (ref 6–23)
CALCIUM: 9.3 mg/dL (ref 8.4–10.5)
CHLORIDE: 104 meq/L (ref 96–112)
CO2: 28 meq/L (ref 19–32)
CREATININE: 1.1 mg/dL (ref 0.4–1.5)
GFR: 70.46 mL/min (ref >60.00)
Glucose, Bld: 95 mg/dL (ref 70–99)
Potassium: 4.2 mEq/L (ref 3.5–5.1)
SODIUM: 138 meq/L (ref 135–145)

## 2014-03-17 LAB — LIPID PANEL
CHOLESTEROL: 151 mg/dL (ref 0–200)
HDL: 34.2 mg/dL — ABNORMAL LOW (ref >39.00)
LDL CALC: 98 mg/dL (ref 0–99)
NonHDL: 116.8
TRIGLYCERIDES: 96 mg/dL (ref 0.0–149.0)
Total CHOL/HDL Ratio: 4
VLDL: 19.2 mg/dL (ref 0.0–40.0)

## 2014-03-17 LAB — TSH: TSH: 3.52 u[IU]/mL (ref 0.35–4.50)

## 2014-03-18 ENCOUNTER — Telehealth: Payer: Self-pay | Admitting: Family

## 2014-03-18 NOTE — Telephone Encounter (Signed)
Please call the patient and inform him lab results were within the expected range and there are no changes to his current plan that need to be made at this point. Please follow up as needed.

## 2014-03-20 NOTE — Telephone Encounter (Signed)
Let pt know that lab results were good.

## 2014-03-20 NOTE — Telephone Encounter (Signed)
Called pt and left a message for him to call me back.

## 2014-06-28 ENCOUNTER — Other Ambulatory Visit: Payer: Self-pay

## 2014-06-28 MED ORDER — LEVOTHYROXINE SODIUM 100 MCG PO TABS: ORAL_TABLET | ORAL | Status: DC

## 2014-06-30 ENCOUNTER — Other Ambulatory Visit: Payer: Self-pay

## 2014-06-30 MED ORDER — LEVOTHYROXINE SODIUM 100 MCG PO TABS: ORAL_TABLET | ORAL | Status: DC

## 2014-09-13 ENCOUNTER — Telehealth: Payer: Self-pay | Admitting: Family

## 2014-09-13 NOTE — Telephone Encounter (Signed)
Called pt he stated concern about the omeprazole that he is taking. Saw on tv that med can cause kidney damages, and dementia if taking for a long time. Wanting to get get [name] advisement he stated he need to take the omeprazole that helps with his acid reflux...Jay Holmes

## 2014-09-13 NOTE — Telephone Encounter (Signed)
Pt called in and has a few question about has medication?    Please call him his cell number

## 2014-09-14 NOTE — Telephone Encounter (Signed)
As with any medications there are risks and benefits. I cannot substaniate any of the claims that omeprazole has been linked with dementia and kidney disease. There have been reports of some decreased electrolytes and minerals, but nothing to the extent of kidney disease or dementia. If it is a major concern, he can always take a break from the medication and we can try other medications for a period of time. There are always natural remedies, however none of these are regulated by the Food and Drug Administration.

## 2014-09-15 NOTE — Telephone Encounter (Signed)
Notified pt with greg response...Johny Chess

## 2014-09-18 ENCOUNTER — Other Ambulatory Visit: Payer: Self-pay

## 2014-09-18 MED ORDER — OMEPRAZOLE 40 MG PO CPDR: DELAYED_RELEASE_CAPSULE | ORAL | Status: DC

## 2014-11-16 ENCOUNTER — Encounter: Payer: Self-pay | Admitting: Gastroenterology

## 2015-01-05 ENCOUNTER — Encounter: Payer: Self-pay | Admitting: Family

## 2015-01-05 ENCOUNTER — Ambulatory Visit (INDEPENDENT_AMBULATORY_CARE_PROVIDER_SITE_OTHER): Payer: 59 | Admitting: Family

## 2015-01-05 VITALS — BP 112/78 | HR 80 | Temp 98.3°F | Resp 18 | Ht 69.0 in | Wt 188.0 lb

## 2015-01-05 DIAGNOSIS — L608 Other nail disorders: Secondary | ICD-10-CM | POA: Insufficient documentation

## 2015-01-05 NOTE — Assessment & Plan Note (Signed)
Right second toenail discolored from possible fungal infection or possibly formal subungual hematoma. Treat with OTC nail lacquer and refer to podiatry for further assessment and potential management.

## 2015-01-05 NOTE — Patient Instructions (Signed)
Thank you for choosing [facility].  Summary/Instructions:  Try a nail lacquer that is available over the counter.   If your symptoms worsen or fail to improve, please contact our office for further instruction, or in case of emergency go directly to the emergency room at the closest medical facility.

## 2015-01-05 NOTE — Progress Notes (Signed)
Pre visit review using our clinic review tool, if applicable. No additional management support is needed unless otherwise documented below in the visit note. 

## 2015-01-05 NOTE — Progress Notes (Signed)
   Subj GERDective:    Patient ID: Jay Holmes, male    DOB: 1956-01-11, 59 y.o.   MRN: [id number]  Chief Complaint  Patient presents with  . Nail Problem    has an issue with toenail that turned dark that he thought was a bruise, never went away, says he has had it happen in the past too and it looks like his little toe on his right foot is startiing to turn too, right now its the middle toe on right foot    HPI:  Jay Holmes is a 59 y.o. male with a PMH of GERD, hypothyroidism, back pain, alopecia, and umbilical hernia who presents today for an acute office visit.    1.) Discolored Toenail - Associated symptom of change in his toenail located on the right second toe has been going on for several months . Has spoken with dermatology and instructed that if it was not grown out to seek further care. Denies any trauma to the area. Describes it as purple with no tenderness. Denies modifying treatments attempted.   No Known Allergies  Current Outpatient Prescriptions on File Prior to Visit  Medication Sig Dispense Refill  . bimatoprost (LUMIGAN) 0.03 % ophthalmic solution Place 1 drop into both eyes at bedtime.      . Glucosamine-Chondroit-Vit C-Mn (GLUCOSAMINE 1500 COMPLEX) CAPS Take 1 capsule by mouth daily.      [name] ketoconazole (NIZORAL) 2 % cream Apply 1 application topically daily.    [name] latanoprost (XALATAN) 0.005 % ophthalmic solution 1 drop at bedtime.    [name] levothyroxine (SYNTHROID, LEVOTHROID) 100 MCG tablet Take 1 tablet (100 mcg  total) by mouth daily. 90 tablet 3  . omeprazole (PRILOSEC) 40 MG capsule Take 1 capsule by mouth  daily 90 capsule 2   No current facility-administered medications on file prior to visit.    Review of Systems  Constitutional: Negative for fever and chills.  Skin: Positive for color change (right 2nd toenail).      Objective:    BP 112/78 mmHg  Pulse 80  Temp(Src) 98.3 F (36.8 C) (Oral)  Resp 18  Ht 5\' 9"  (1.753 m)  Wt 188 lb (85.276 kg)   BMI 27.75 kg/m2  SpO2 98% Nursing note and vital signs reviewed.  Physical Exam  Constitutional: He is oriented to person, place, and time. He appears well-developed and well-nourished. No distress.  Cardiovascular: Normal rate, regular rhythm, normal heart sounds and intact distal pulses.   Pulmonary/Chest: Effort normal and breath sounds normal.  Neurological: He is alert and oriented to person, place, and time.  Skin: Skin is warm and dry.  Right second toenail with thickening and dark colored. Cuticle is intact and appropriate. Nail is irregularly trimmed. No tenderness to palpation.   Psychiatric: He has a normal mood and affect. His behavior is normal. Judgment and thought content normal.       Assessment & Plan:   Problem List Items Addressed This Visit      Other   Discolored nails - Primary    Right second toenail discolored from possible fungal infection or possibly formal subungual hematoma. Treat with OTC nail lacquer and refer to podiatry for further assessment and potential management.       Relevant Orders   Ambulatory referral to Podiatry

## 2015-01-18 ENCOUNTER — Encounter: Payer: Self-pay | Admitting: Family

## 2015-01-18 ENCOUNTER — Ambulatory Visit (INDEPENDENT_AMBULATORY_CARE_PROVIDER_SITE_OTHER): Payer: 59 | Admitting: Family

## 2015-01-18 VITALS — BP 122/88 | HR 78 | Temp 97.6°F | Resp 18 | Ht 69.0 in | Wt 193.0 lb

## 2015-01-18 DIAGNOSIS — L237 Allergic contact dermatitis due to plants, except food: Secondary | ICD-10-CM | POA: Diagnosis not present

## 2015-01-18 MED ORDER — TRIAMCINOLONE ACETONIDE 0.1 % EX CREA: 1.0000 "application " | TOPICAL_CREAM | Freq: Two times a day (BID) | CUTANEOUS | Status: DC

## 2015-01-18 NOTE — Assessment & Plan Note (Signed)
Symptoms and exam consistent with poison ivy dermatitis. Start triamcinolone cream. Continue conservative treatment with cool compresses to decrease itching. No indication for prednisone at this time. Follow-up if symptoms worsen or fail to improve.

## 2015-01-18 NOTE — Patient Instructions (Signed)
Thank you for choosing [facility].  Summary/Instructions:  Your prescription(s) have been submitted to your pharmacy or been printed and provided for you. Please take as directed and contact our office if you believe you are having problem(s) with the medication(s) or have any questions.  If your symptoms worsen or fail to improve, please contact our office for further instruction, or in case of emergency go directly to the emergency room at the closest medical facility.   Poison [employer] ivy is a inflammation of the skin (contact dermatitis) caused by touching the allergens on the leaves of the ivy plant following previous exposure to the plant. The rash usually appears 48 hours after exposure. The rash is usually bumps (papules) or blisters (vesicles) in a linear pattern. Depending on your own sensitivity, the rash may simply cause redness and itching, or it may also progress to blisters which may break open. These must be well cared for to prevent secondary bacterial (germ) infection, followed by scarring. Keep any open areas dry, clean, dressed, and covered with an antibacterial ointment if needed. The eyes may also get puffy. The puffiness is worst in the morning and gets better as the day progresses. This dermatitis usually heals without scarring, within 2 to 3 weeks without treatment. HOME CARE INSTRUCTIONS  Thoroughly wash with soap and water as soon as you have been exposed to poison ivy. You have about one half hour to remove the plant resin before it will cause the rash. This washing will destroy the oil or antigen on the skin that is causing, or will cause, the rash. Be sure to wash under your fingernails as any plant resin there will continue to spread the rash. Do not rub skin vigorously when washing affected area. Poison ivy cannot spread if no oil from the plant remains on your body. A rash that has progressed to weeping sores will not spread the rash unless you have not washed  thoroughly. It is also important to wash any clothes you have been wearing as these may carry active allergens. The rash will return if you wear the unwashed clothing, even several days later. Avoidance of the plant in the future is the best measure. Poison ivy plant can be recognized by the number of leaves. Generally, poison ivy has three leaves with flowering branches on a single stem. Diphenhydramine may be purchased over the counter and used as needed for itching. Do not drive with this medication if it makes you drowsy.Ask your caregiver about medication for children. SEEK MEDICAL CARE IF:  Open sores develop.  Redness spreads beyond area of rash.  You notice purulent (pus-like) discharge.  You have increased pain.  Other signs of infection develop (such as fever). Document Released: 04/25/2000 Document Revised: 07/21/2011 Document Reviewed: 10/06/2008 [facility] Patient Information 2015 [place], [place]. This information is not intended to replace advice given to you by your health care provider. Make sure you discuss any questions you have with your health care provider.

## 2015-01-18 NOTE — Progress Notes (Signed)
   Subjective:    Patient ID: Jay Holmes, male    DOB: 08/24/1955, 59 y.o.   MRN: [id number]  Chief Complaint  Patient presents with  . Poison Ivy    HPI:  Jay Holmes is a 59 y.o. male with a PMH of glaucoma, thyroid, GERD, and kidney stones  who presents today for an acute office visit.   This is a new problem. Associated symptom of a rash located on his bilateral hands started about 1 Holmes ago following working in the yard. Believes he may have been exposed to poison ivy. Describes the rash as small, red itchy bumps that have progressively worsened over the past Holmes. Denies any modifying treatments since onset.   No Known Allergies   Current Outpatient Prescriptions on File Prior to Visit  Medication Sig Dispense Refill  . bimatoprost (LUMIGAN) 0.03 % ophthalmic solution Place 1 drop into both eyes at bedtime.      . Glucosamine-Chondroit-Vit C-Mn (GLUCOSAMINE 1500 COMPLEX) CAPS Take 1 capsule by mouth daily.      [name] ketoconazole (NIZORAL) 2 % cream Apply 1 application topically daily.    [name] latanoprost (XALATAN) 0.005 % ophthalmic solution 1 drop at bedtime.    [name] levothyroxine (SYNTHROID, LEVOTHROID) 100 MCG tablet Take 1 tablet (100 mcg  total) by mouth daily. 90 tablet 3  . omeprazole (PRILOSEC) 40 MG capsule Take 1 capsule by mouth  daily 90 capsule 2   No current facility-administered medications on file prior to visit.    Review of Systems  Constitutional: Negative for fever and chills.  Skin: Positive for rash.      Objective:    BP 122/88 mmHg  Pulse 78  Temp(Src) 97.6 F (36.4 C) (Oral)  Resp 18  Ht 5\' 9"  (1.753 m)  Wt 193 lb (87.544 kg)  BMI 28.49 kg/m2  SpO2 96% Nursing note and vital signs reviewed.  Physical Exam  Constitutional: He is oriented to person, place, and time. He appears well-developed and well-nourished. No distress.  Cardiovascular: Normal rate, regular rhythm, normal heart sounds and intact distal pulses.   Pulmonary/Chest: Effort  normal and breath sounds normal.  Neurological: He is alert and oriented to person, place, and time.  Skin: Skin is warm and dry.  3 small papules on right hand with largest at base of his wrist appear red in color with no discharge.   Psychiatric: He has a normal mood and affect. His behavior is normal. Judgment and thought content normal.      Assessment & Plan:   Problem List Items Addressed This Visit      Musculoskeletal and Integument   Poison ivy dermatitis - Primary    Symptoms and exam consistent with poison ivy dermatitis. Start triamcinolone cream. Continue conservative treatment with cool compresses to decrease itching. No indication for prednisone at this time. Follow-up if symptoms worsen or fail to improve.      Relevant Medications   triamcinolone cream (KENALOG) 0.1 %

## 2015-01-18 NOTE — Progress Notes (Signed)
Pre visit review using our clinic review tool, if applicable. No additional management support is needed unless otherwise documented below in the visit note. 

## 2015-02-22 ENCOUNTER — Encounter: Payer: Self-pay | Admitting: Family

## 2015-02-27 ENCOUNTER — Ambulatory Visit (INDEPENDENT_AMBULATORY_CARE_PROVIDER_SITE_OTHER): Payer: 59

## 2015-02-27 DIAGNOSIS — Z23 Encounter for immunization: Secondary | ICD-10-CM

## 2015-04-01 ENCOUNTER — Other Ambulatory Visit: Payer: Self-pay | Admitting: Family

## 2015-04-09 ENCOUNTER — Encounter: Payer: Self-pay | Admitting: *Deleted

## 2015-04-09 ENCOUNTER — Telehealth: Payer: Self-pay | Admitting: *Deleted

## 2015-04-09 NOTE — Telephone Encounter (Signed)
Dr Hilarie Fredrickson looked over patient's recall for colonoscopy. Dr Hilarie Fredrickson states that colonoscopy would not be due until 2021 unless patient has family history of colon cancer to warrant 5 year interval. No polyps at complete colon in 2011. I have contacted patient who is unaware of any family history of colon cancer.

## 2015-04-18 ENCOUNTER — Encounter: Payer: Self-pay | Admitting: Internal Medicine

## 2015-06-03 ENCOUNTER — Other Ambulatory Visit: Payer: Self-pay | Admitting: Family

## 2015-07-03 ENCOUNTER — Telehealth: Payer: Self-pay

## 2015-07-03 ENCOUNTER — Other Ambulatory Visit: Payer: Self-pay

## 2015-07-03 DIAGNOSIS — E039 Hypothyroidism, unspecified: Secondary | ICD-10-CM

## 2015-07-03 NOTE — Telephone Encounter (Signed)
Received a medication request from pharmacy for levothyroxine. Called pt and let him know that the medication can not be filled until we have an updated TSH lab on him. Last lab was done in 2015. Pt is aware and understands.

## 2015-07-04 ENCOUNTER — Telehealth: Payer: Self-pay | Admitting: Family

## 2015-07-04 ENCOUNTER — Other Ambulatory Visit (INDEPENDENT_AMBULATORY_CARE_PROVIDER_SITE_OTHER): Payer: 59

## 2015-07-04 DIAGNOSIS — E039 Hypothyroidism, unspecified: Secondary | ICD-10-CM

## 2015-07-04 LAB — TSH: TSH: 3.5 u[IU]/mL (ref 0.35–4.50)

## 2015-07-04 MED ORDER — LEVOTHYROXINE SODIUM 100 MCG PO TABS: ORAL_TABLET | ORAL | Status: DC

## 2015-07-04 NOTE — Telephone Encounter (Signed)
Please inform patient his thyroid is stable and his TSH is 3.5. I have sent in a prescription for his thyroid medication.

## 2015-07-06 NOTE — Telephone Encounter (Signed)
Pt aware of results 

## 2015-08-07 ENCOUNTER — Emergency Department (HOSPITAL_COMMUNITY)
Admission: EM | Admit: 2015-08-07 | Discharge: 2015-08-08 | Disposition: A | Discharge status: Home / Self Care | Payer: 59 | Attending: Emergency Medicine | Admitting: Emergency Medicine

## 2015-08-07 ENCOUNTER — Encounter (HOSPITAL_COMMUNITY): Payer: Self-pay | Admitting: Emergency Medicine

## 2015-08-07 DIAGNOSIS — R140 Abdominal distension (gaseous): Secondary | ICD-10-CM | POA: Insufficient documentation

## 2015-08-07 DIAGNOSIS — Z87442 Personal history of urinary calculi: Secondary | ICD-10-CM | POA: Insufficient documentation

## 2015-08-07 DIAGNOSIS — Z8709 Personal history of other diseases of the respiratory system: Secondary | ICD-10-CM | POA: Insufficient documentation

## 2015-08-07 DIAGNOSIS — R1084 Generalized abdominal pain: Secondary | ICD-10-CM | POA: Diagnosis present

## 2015-08-07 DIAGNOSIS — R1013 Epigastric pain: Secondary | ICD-10-CM | POA: Diagnosis not present

## 2015-08-07 DIAGNOSIS — Z79899 Other long term (current) drug therapy: Secondary | ICD-10-CM | POA: Diagnosis not present

## 2015-08-07 DIAGNOSIS — H409 Unspecified glaucoma: Secondary | ICD-10-CM | POA: Diagnosis not present

## 2015-08-07 DIAGNOSIS — R112 Nausea with vomiting, unspecified: Secondary | ICD-10-CM | POA: Insufficient documentation

## 2015-08-07 DIAGNOSIS — E039 Hypothyroidism, unspecified: Secondary | ICD-10-CM | POA: Insufficient documentation

## 2015-08-07 DIAGNOSIS — R197 Diarrhea, unspecified: Secondary | ICD-10-CM | POA: Insufficient documentation

## 2015-08-07 DIAGNOSIS — R1033 Periumbilical pain: Secondary | ICD-10-CM | POA: Insufficient documentation

## 2015-08-07 DIAGNOSIS — Z8739 Personal history of other diseases of the musculoskeletal system and connective tissue: Secondary | ICD-10-CM | POA: Diagnosis not present

## 2015-08-07 DIAGNOSIS — Z872 Personal history of diseases of the skin and subcutaneous tissue: Secondary | ICD-10-CM | POA: Diagnosis not present

## 2015-08-07 LAB — COMPREHENSIVE METABOLIC PANEL
ALT: 45 U/L (ref 17–63)
ANION GAP: 9 (ref 5–15)
AST: 29 U/L (ref 15–41)
Albumin: 4.3 g/dL (ref 3.5–5.0)
Alkaline Phosphatase: 101 U/L (ref 38–126)
BUN: 17 mg/dL (ref 6–20)
CHLORIDE: 104 mmol/L (ref 101–111)
CO2: 22 mmol/L (ref 22–32)
Calcium: 9 mg/dL (ref 8.9–10.3)
Creatinine, Ser: 1.12 mg/dL (ref 0.61–1.24)
Glucose, Bld: 107 mg/dL — ABNORMAL HIGH (ref 65–99)
Potassium: 3.5 mmol/L (ref 3.5–5.1)
SODIUM: 135 mmol/L (ref 135–145)
Total Bilirubin: 0.8 mg/dL (ref 0.3–1.2)
Total Protein: 7.7 g/dL (ref 6.5–8.1)

## 2015-08-07 LAB — CBC
HEMATOCRIT: 47.5 % (ref 39.0–52.0)
Hemoglobin: 16.4 g/dL (ref 13.0–17.0)
MCH: 31.1 pg (ref 26.0–34.0)
MCHC: 34.5 g/dL (ref 30.0–36.0)
MCV: 90 fL (ref 78.0–100.0)
Platelets: 207 10*3/uL (ref 150–400)
RBC: 5.28 MIL/uL (ref 4.22–5.81)
RDW: 13.5 % (ref 11.5–15.5)
WBC: 7.2 10*3/uL (ref 4.0–10.5)

## 2015-08-07 LAB — LIPASE, BLOOD: LIPASE: 27 U/L (ref 11–51)

## 2015-08-07 NOTE — ED Notes (Signed)
Pt states that he had severe vomiting 3 days ago and has had intermittent vomiting since that time. Unable to eat much. Small BM tonight. Lower abdominal pain. Alert and oriented.

## 2015-08-08 ENCOUNTER — Emergency Department (HOSPITAL_COMMUNITY): Payer: 59

## 2015-08-08 ENCOUNTER — Encounter (HOSPITAL_COMMUNITY): Payer: Self-pay

## 2015-08-08 LAB — URINALYSIS, ROUTINE W REFLEX MICROSCOPIC
GLUCOSE, UA: NEGATIVE mg/dL
Hgb urine dipstick: NEGATIVE
KETONES UR: 40 mg/dL — AB
LEUKOCYTES UA: NEGATIVE
Nitrite: NEGATIVE
PH: 5.5 (ref 5.0–8.0)
Protein, ur: NEGATIVE mg/dL
SPECIFIC GRAVITY, URINE: 1.029 (ref 1.005–1.030)

## 2015-08-08 MED ORDER — DICYCLOMINE HCL 20 MG PO TABS: 20.0000 mg | ORAL_TABLET | Freq: Two times a day (BID) | ORAL | Status: DC

## 2015-08-08 MED ORDER — MORPHINE SULFATE (PF) 4 MG/ML IV SOLN
4.0000 mg | Freq: Once | INTRAVENOUS | Status: AC
  Administered 2015-08-08: 4 mg via INTRAVENOUS
  Filled 2015-08-08: qty 1

## 2015-08-08 MED ORDER — DICYCLOMINE HCL 10 MG PO CAPS
20.0000 mg | ORAL_CAPSULE | Freq: Once | ORAL | Status: AC
  Administered 2015-08-08: 20 mg via ORAL
  Filled 2015-08-08: qty 2

## 2015-08-08 MED ORDER — IOHEXOL 300 MG/ML  SOLN
25.0000 mL | Freq: Once | INTRAMUSCULAR | Status: AC | PRN
  Administered 2015-08-08: 25 mL via ORAL

## 2015-08-08 MED ORDER — HYDROCODONE-ACETAMINOPHEN 5-325 MG PO TABS: 1.0000 | ORAL_TABLET | ORAL | Status: DC | PRN

## 2015-08-08 MED ORDER — ONDANSETRON HCL 4 MG/2ML IJ SOLN
4.0000 mg | Freq: Once | INTRAMUSCULAR | Status: AC
  Administered 2015-08-08: 4 mg via INTRAVENOUS
  Filled 2015-08-08: qty 2

## 2015-08-08 MED ORDER — IOPAMIDOL (ISOVUE-300) INJECTION 61%
100.0000 mL | Freq: Once | INTRAVENOUS | Status: AC | PRN
  Administered 2015-08-08: 100 mL via INTRAVENOUS

## 2015-08-08 MED ORDER — ONDANSETRON 4 MG PO TBDP: 4.0000 mg | ORAL_TABLET | Freq: Three times a day (TID) | ORAL | Status: DC | PRN

## 2015-08-08 NOTE — ED Provider Notes (Signed)
CSN: [id number]     Arrival date & time 08/07/15  2107 History   First MD Initiated Contact with Patient 08/08/15 0201     Chief Complaint  Patient presents with  . Abdominal Pain     (Consider location/radiation/quality/duration/timing/severity/associated sxs/prior Treatment) HPI Comments: Patient with history of kidney stones, glaucoma presents with abdominal pain, nausea, vomiting and diarrhea. No fever. He reports diarrhea, nausea and vomiting started 3 days ago, with progressively worsening abdominal pain and distention since that time. He report vomiting and diarrhea episodes have decreased but persisted. No alleviating or aggravating factors. Pain is generalized but seems to be concentrated in the RUQ. He reports a loss of appetite. Normal colonoscopy 6 years ago, due again in 4 years.   Patient is a 60 y.o. male presenting with abdominal pain. The history is provided by the patient. No language interpreter was used.  Abdominal Pain Pain location:  Generalized Associated symptoms: diarrhea, nausea and vomiting   Associated symptoms: no chest pain, no cough, no dysuria, no fever and no shortness of breath     Past Medical History  Diagnosis Date  . Personal history of diseases of skin and subcutaneous tissue   . Unspecified sinusitis (chronic)   . Personal history of urinary calculi   . Unspecified hypothyroidism   . Glaucoma   . Diastasis recti 07/21/2012    questionable umbilical hernia   Past Surgical History  Procedure Laterality Date  . Tonsillectomy and adenoidectomy     Family History  Problem Relation Age of Onset  . Memory loss Mother   . Hypertension Mother   . Hyperlipidemia Mother   . Cancer Mother     breast cancer  . Cancer Father     prostate  . Diabetes Father     diet controlled  . Cleft lip Son   . Heart disease Maternal Grandmother   . Colon cancer Neg Hx    Social History  Substance Use Topics  . Smoking status: Never Smoker   . Smokeless  tobacco: Never Used  . Alcohol Use: No    Review of Systems  Constitutional: Negative for fever.  Respiratory: Negative for cough and shortness of breath.   Cardiovascular: Negative for chest pain.  Gastrointestinal: Positive for nausea, vomiting, abdominal pain, diarrhea and abdominal distention.  Genitourinary: Negative for dysuria.  Musculoskeletal: Negative for myalgias.  Neurological: Negative for syncope and weakness.      Allergies  Review of patient's allergies indicates no known allergies.  Home Medications   Prior to Admission medications   Medication Sig Start Date End Date Taking? Authorizing Provider  bimatoprost (LUMIGAN) 0.03 % ophthalmic solution Place 1 drop into both eyes at bedtime.     Yes Historical Provider, MD  Glucosamine-Chondroit-Vit C-Mn (GLUCOSAMINE 1500 COMPLEX) CAPS Take 1 capsule by mouth daily.     Yes Historical Provider, MD  latanoprost (XALATAN) 0.005 % ophthalmic solution 1 drop at bedtime.   Yes Historical Provider, MD  levothyroxine (SYNTHROID, LEVOTHROID) 100 MCG tablet Take 1 tablet by mouth  daily 07/04/15  Yes Golden Circle, FNP  LOTEMAX 0.5 % ophthalmic suspension Place 1 drop into both eyes 2 (two) times daily. 07/24/15  Yes Historical Provider, MD  omeprazole (PRILOSEC) 40 MG capsule Take 1 capsule by mouth  daily 06/04/15  Yes Golden Circle, FNP   BP 164/92 mmHg  Pulse 84  Temp(Src) 97.6 F (36.4 C) (Oral)  Resp 19  SpO2 99% Physical Exam  Constitutional: He is oriented to  person, place, and time. He appears well-developed and well-nourished.  HENT:  Head: Normocephalic.  Neck: Normal range of motion. Neck supple.  Cardiovascular: Normal rate.   Pulmonary/Chest: Effort normal.  Abdominal: He exhibits distension. There is tenderness.  Decreased BS throughout. Abdomen is tense with worst tenderness in RUQ and periumbilical area.   Musculoskeletal: Normal range of motion.  Neurological: He is alert and oriented to person,  place, and time.  Skin: Skin is warm and dry. No rash noted.  Psychiatric: He has a normal mood and affect.    ED Course  Procedures (including critical care time) Labs Review Labs Reviewed  COMPREHENSIVE METABOLIC PANEL - Abnormal; Notable for the following:    Glucose, Bld 107 (*)    All other components within normal limits  LIPASE, BLOOD  CBC  URINALYSIS, ROUTINE W REFLEX MICROSCOPIC (NOT AT [facility])   Results for orders placed or performed during the hospital encounter of 08/07/15  Lipase, blood  Result Value Ref Range   Lipase 27 11 - 51 U/L  Comprehensive metabolic panel  Result Value Ref Range   Sodium 135 135 - 145 mmol/L   Potassium 3.5 3.5 - 5.1 mmol/L   Chloride 104 101 - 111 mmol/L   CO2 22 22 - 32 mmol/L   Glucose, Bld 107 (H) 65 - 99 mg/dL   BUN 17 6 - 20 mg/dL   Creatinine, Ser 1.12 0.61 - 1.24 mg/dL   Calcium 9.0 8.9 - 10.3 mg/dL   Total Protein 7.7 6.5 - 8.1 g/dL   Albumin 4.3 3.5 - 5.0 g/dL   AST 29 15 - 41 U/L   ALT 45 17 - 63 U/L   Alkaline Phosphatase 101 38 - 126 U/L   Total Bilirubin 0.8 0.3 - 1.2 mg/dL   GFR calc non Af Amer >60 >60 mL/min   GFR calc Af Amer >60 >60 mL/min   Anion gap 9 5 - 15  CBC  Result Value Ref Range   WBC 7.2 4.0 - 10.5 K/uL   RBC 5.28 4.22 - 5.81 MIL/uL   Hemoglobin 16.4 13.0 - 17.0 g/dL   HCT 47.5 39.0 - 52.0 %   MCV 90.0 78.0 - 100.0 fL   MCH 31.1 26.0 - 34.0 pg   MCHC 34.5 30.0 - 36.0 g/dL   RDW 13.5 11.5 - 15.5 %   Platelets 207 150 - 400 K/uL  Urinalysis, Routine w reflex microscopic (not at [facility])  Result Value Ref Range   Color, Urine AMBER (A) YELLOW   APPearance CLEAR CLEAR   Specific Gravity, Urine 1.029 1.005 - 1.030   pH 5.5 5.0 - 8.0   Glucose, UA NEGATIVE NEGATIVE mg/dL   Hgb urine dipstick NEGATIVE NEGATIVE   Bilirubin Urine SMALL (A) NEGATIVE   Ketones, ur 40 (A) NEGATIVE mg/dL   Protein, ur NEGATIVE NEGATIVE mg/dL   Nitrite NEGATIVE NEGATIVE   Leukocytes, UA NEGATIVE NEGATIVE   Ct Abdomen  Pelvis W Contrast  08/08/2015  CLINICAL DATA:  Acute onset of lower abdominal pain, nausea and vomiting. Initial encounter. EXAM: CT ABDOMEN AND PELVIS WITH CONTRAST TECHNIQUE: Multidetector CT imaging of the abdomen and pelvis was performed using the standard protocol following bolus administration of intravenous contrast. CONTRAST:  172mL ISOVUE-300 IOPAMIDOL (ISOVUE-300) INJECTION 61% COMPARISON:  CT of the abdomen and pelvis performed 12/15/2007 FINDINGS: The visualized lung bases are clear. The liver and spleen are unremarkable in appearance. A calcified granuloma is noted at the hepatic hilum. The gallbladder is within normal  limits. The pancreas and adrenal glands are unremarkable. The kidneys are unremarkable in appearance. There is no evidence of hydronephrosis. No renal or ureteral stones are seen. Nonspecific perinephric stranding is noted bilaterally. No free fluid is identified. The small bowel is unremarkable in appearance. The stomach is within normal limits. No acute vascular abnormalities are seen. The appendix is grossly unremarkable, though difficult to fully assess. There is no evidence for appendicitis. The colon is largely filled with air and trace fluid, and is grossly unremarkable in appearance. The bladder is mildly distended and grossly unremarkable. The prostate remains normal in size. No inguinal lymphadenopathy is seen. No acute osseous abnormalities are identified. IMPRESSION: No acute abnormality seen within the abdomen or pelvis. Electronically Signed   By: Garald Balding M.D.   On: 08/08/2015 04:17    Imaging Review No results found. I have personally reviewed and evaluated these images and lab results as part of my medical decision-making.   EKG Interpretation None      MDM   Final diagnoses:  None    1. Abdominal pain  Patient has abdominal pain, vomiting and diarrhea for 4 days. No fever. Lab studies here are essentially normal (UA pending). He is concerningly  tender and distended. No abdominal surgeries.   2:20 - CT scan ordered for further evaluation. Will consider ultrasound if negative.   5:00 - Pain has been managed on 4 mg Morphine, but he reports it is starting to recur. Bentyl provided. CT scan shows no abnormalities. Gall bladder visualized and is negative. LFT's normal. [place] is not felt likely to provide any further information.   5:30 - less pain relief with Bentyl. Additional Morphine provided. Patient is felt stable for discharge home. Recommend PCP follow up.   Charlann Lange, PA-C 08/08/15 Kelly, MD 08/09/15 [phone]

## 2015-08-08 NOTE — ED Notes (Signed)
Provider in room  

## 2015-08-08 NOTE — Discharge Instructions (Signed)

## 2015-08-08 NOTE — ED Notes (Signed)
Pt drinking contrast and given pain meds and nausea meds

## 2015-08-13 ENCOUNTER — Ambulatory Visit (INDEPENDENT_AMBULATORY_CARE_PROVIDER_SITE_OTHER): Payer: 59 | Admitting: Family

## 2015-08-13 ENCOUNTER — Encounter: Payer: Self-pay | Admitting: Family

## 2015-08-13 VITALS — BP 112/72 | HR 70 | Temp 97.6°F | Resp 16 | Ht 69.0 in | Wt 198.0 lb

## 2015-08-13 DIAGNOSIS — R140 Abdominal distension (gaseous): Secondary | ICD-10-CM | POA: Insufficient documentation

## 2015-08-13 MED ORDER — DICYCLOMINE HCL 20 MG PO TABS: 20.0000 mg | ORAL_TABLET | Freq: Two times a day (BID) | ORAL | Status: DC

## 2015-08-13 NOTE — Progress Notes (Signed)
Subjective:    Patient ID: Jay Holmes, male    DOB: 1955-07-30, 60 y.o.   MRN: [id number]  Chief Complaint  Patient presents with  . Hospitalization Follow-up    still has some discomfort in his stomach at times, says he does not feel the same, stomach still feels tight    HPI:  Jay Holmes is a 61 y.o. male who  has a past medical history of Personal history of diseases of skin and subcutaneous tissue; Unspecified sinusitis (chronic); Personal history of urinary calculi; Unspecified hypothyroidism; Glaucoma; and Diastasis recti (07/21/2012). and presents today for a follow-up office visit.  This is a new problem. Recently evaluated in the emergency department with abdominal pain, nausea, vomiting, and diarrhea with no fever for 3 days and progressively worsening abdominal pain and distention. Physical exam revealed distention and tenderness with decreased bowel sounds throughout with an abdomen diet was described as tense with the worse tenderness in the right upper quadrant and periumbilical area. Blood work was mostly unremarkable with lipase of 27. CT scan of the abdomen showed no acute abnormalities with the liver, spleen, and pancreas being unremarkable. Pain was managed with 4 mg of morphine and Bentyl. Additional morphine was provided and he was felt stable for discharge with recommended primary care follow-up. His discharge medications were hydrocodone-acetaminophen, ondansetron, and dicyclomine. All emergency room records, labs, and imaging reviewed in detail.  Since leaving the ED he continues to experience the associated symptom of tightness located in his abdomen that is described like an inflated balloon. Modifying factors including the dicyclomine which decreases the cramping pains. Did have 1 episode of emesis with no additional events or nausea. Continues to experience a decreased appetite but is able to eat. Denies any constipation and 1 episode of loss stools. His acid  reflux is currently maintained on omeprazole. No fevers.    No Known Allergies   Current Outpatient Prescriptions on File Prior to Visit  Medication Sig Dispense Refill  . bimatoprost (LUMIGAN) 0.03 % ophthalmic solution Place 1 drop into both eyes at bedtime.      . Glucosamine-Chondroit-Vit C-Mn (GLUCOSAMINE 1500 COMPLEX) CAPS Take 1 capsule by mouth daily.      [name] HYDROcodone-acetaminophen (NORCO/VICODIN) 5-325 MG tablet Take 1-2 tablets by mouth every 4 (four) hours as needed. 12 tablet 0  . latanoprost (XALATAN) 0.005 % ophthalmic solution 1 drop at bedtime.    [name] levothyroxine (SYNTHROID, LEVOTHROID) 100 MCG tablet Take 1 tablet by mouth  daily 90 tablet 0  . LOTEMAX 0.5 % ophthalmic suspension Place 1 drop into both eyes 2 (two) times daily.  1  . omeprazole (PRILOSEC) 40 MG capsule Take 1 capsule by mouth  daily 90 capsule 0  . ondansetron (ZOFRAN ODT) 4 MG disintegrating tablet Take 1 tablet (4 mg total) by mouth every 8 (eight) hours as needed for nausea or vomiting. 20 tablet 0   No current facility-administered medications on file prior to visit.    Past Medical History  Diagnosis Date  . Personal history of diseases of skin and subcutaneous tissue   . Unspecified sinusitis (chronic)   . Personal history of urinary calculi   . Unspecified hypothyroidism   . Glaucoma   . Diastasis recti 07/21/2012    questionable umbilical hernia    Past Surgical History  Procedure Laterality Date  . Tonsillectomy and adenoidectomy       Review of Systems  Constitutional: Positive for appetite change. Negative for fever and chills.  Respiratory: Negative for chest tightness and shortness of breath.   Cardiovascular: Negative for chest pain, palpitations and leg swelling.  Gastrointestinal: Positive for abdominal distention. Negative for abdominal pain.      Objective:    BP 112/72 mmHg  Pulse 70  Temp(Src) 97.6 F (36.4 C) (Oral)  Resp 16  Ht 5\' 9"  (1.753 m)  Wt 198 lb  (89.812 kg)  BMI 29.23 kg/m2  SpO2 97% Nursing note and vital signs reviewed.  Physical Exam  Constitutional: He is oriented to person, place, and time. He appears well-developed and well-nourished. No distress.  Cardiovascular: Normal rate, regular rhythm, normal heart sounds and intact distal pulses.   Pulmonary/Chest: Effort normal and breath sounds normal.  Abdominal: Soft. Normal appearance. He exhibits no mass. Bowel sounds are decreased. There is no hepatosplenomegaly. There is no tenderness. There is no rigidity, no rebound, no guarding, no tenderness at McBurney's point and negative Murphy's sign.  Neurological: He is alert and oriented to person, place, and time.  Skin: Skin is warm and dry.  Psychiatric: He has a normal mood and affect. His behavior is normal. Judgment and thought content normal.       Assessment & Plan:   Problem List Items Addressed This Visit      Other   Abdominal bloating - Primary    Symptoms of abdominal bloating with undetermined origin given mostly normal physical exam and CT scan. Previous blood work was unremarkable. Treat conservatively at this time and continue current dosage of dicyclomine as needed for cramping and discomfort. Start probiotic such as Interior and spatial designer. Start simethicone as needed for gas production. Follow-up if symptoms worsen or fail to improve for possible referral to gastroenterology.      Relevant Medications   dicyclomine (BENTYL) 20 MG tablet       I am having Mr. Damm maintain his GLUCOSAMINE 1500 COMPLEX, bimatoprost, latanoprost, omeprazole, levothyroxine, LOTEMAX, HYDROcodone-acetaminophen, ondansetron, and dicyclomine.   Meds ordered this encounter  Medications  . dicyclomine (BENTYL) 20 MG tablet    Sig: Take 1 tablet (20 mg total) by mouth 2 (two) times daily.    Dispense:  60 tablet    Refill:  0    Order Specific Question:  Supervising Provider    Answer:  Pricilla Holm A [id number]      Follow-up: Return if symptoms worsen or fail to improve.  Mauricio Po, FNP

## 2015-08-13 NOTE — Progress Notes (Signed)
Pre visit review using our clinic review tool, if applicable. No additional management support is needed unless otherwise documented below in the visit note. 

## 2015-08-13 NOTE — Assessment & Plan Note (Signed)
Symptoms of abdominal bloating with undetermined origin given mostly normal physical exam and CT scan. Previous blood work was unremarkable. Treat conservatively at this time and continue current dosage of dicyclomine as needed for cramping and discomfort. Start probiotic such as Interior and spatial designer. Start simethicone as needed for gas production. Follow-up if symptoms worsen or fail to improve for possible referral to gastroenterology.

## 2015-08-13 NOTE — Patient Instructions (Addendum)
Thank you for choosing [facility].  Summary/Instructions:  Please continue to take your medications as prescribed.  A refill of the dicyclomine has been sent to pharmacy.  Recommend a probiotic such as Align (available in the supplement/vitamin section) or Activia.  Recommend maximum strength Gas-X or Simethicone.   Your prescription(s) have been submitted to your pharmacy or been printed and provided for you. Please take as directed and contact our office if you believe you are having problem(s) with the medication(s) or have any questions.  If your symptoms worsen or fail to improve, please contact our office for further instruction, or in case of emergency go directly to the emergency room at the closest medical facility.

## 2015-08-27 ENCOUNTER — Other Ambulatory Visit: Payer: Self-pay | Admitting: Family

## 2015-10-03 ENCOUNTER — Other Ambulatory Visit: Payer: Self-pay | Admitting: Family

## 2015-10-04 MED ORDER — OMEPRAZOLE 40 MG PO CPDR: DELAYED_RELEASE_CAPSULE | ORAL | Status: DC

## 2015-10-04 NOTE — Addendum Note (Signed)
Addended by: Earnstine Regal on: 10/04/2015 12:52 PM   Modules accepted: Orders

## 2015-10-04 NOTE — Telephone Encounter (Signed)
Receive fax stating need a supervising physician for rx written by advance practice clinician. In order to fill the Omeprazole. Pls attach supervising physician name, NPI and address,,,Resent script w/dr crawford as supervising MD.../lmb

## 2015-10-05 ENCOUNTER — Other Ambulatory Visit: Payer: Self-pay

## 2015-10-05 MED ORDER — LEVOTHYROXINE SODIUM 100 MCG PO TABS: ORAL_TABLET | ORAL | Status: DC

## 2015-11-26 ENCOUNTER — Other Ambulatory Visit: Payer: Self-pay | Admitting: Family

## 2016-01-22 ENCOUNTER — Encounter: Payer: Self-pay | Admitting: Family

## 2016-01-22 ENCOUNTER — Other Ambulatory Visit (INDEPENDENT_AMBULATORY_CARE_PROVIDER_SITE_OTHER): Payer: 59

## 2016-01-22 ENCOUNTER — Ambulatory Visit (INDEPENDENT_AMBULATORY_CARE_PROVIDER_SITE_OTHER): Payer: 59 | Admitting: Family

## 2016-01-22 VITALS — BP 124/86 | HR 72 | Temp 98.0°F | Resp 16 | Ht 69.0 in | Wt 204.8 lb

## 2016-01-22 DIAGNOSIS — Z0000 Encounter for general adult medical examination without abnormal findings: Secondary | ICD-10-CM

## 2016-01-22 DIAGNOSIS — Z23 Encounter for immunization: Secondary | ICD-10-CM

## 2016-01-22 LAB — COMPREHENSIVE METABOLIC PANEL
ALK PHOS: 98 U/L (ref 39–117)
ALT: 42 U/L (ref 0–53)
AST: 25 U/L (ref 0–37)
Albumin: 4.4 g/dL (ref 3.5–5.2)
BILIRUBIN TOTAL: 0.5 mg/dL (ref 0.2–1.2)
BUN: 18 mg/dL (ref 6–23)
CALCIUM: 9.1 mg/dL (ref 8.4–10.5)
CO2: 31 meq/L (ref 19–32)
Chloride: 106 mEq/L (ref 96–112)
Creatinine, Ser: 1.05 mg/dL (ref 0.40–1.50)
GFR: 76.22 mL/min (ref >60.00)
GLUCOSE: 96 mg/dL (ref 70–99)
POTASSIUM: 5 meq/L (ref 3.5–5.1)
Sodium: 140 mEq/L (ref 135–145)
TOTAL PROTEIN: 7.1 g/dL (ref 6.0–8.3)

## 2016-01-22 LAB — CBC
HEMATOCRIT: 46.2 % (ref 39.0–52.0)
HEMOGLOBIN: 16 g/dL (ref 13.0–17.0)
MCHC: 34.7 g/dL (ref 30.0–36.0)
MCV: 91.8 fl (ref 78.0–100.0)
PLATELETS: 216 10*3/uL (ref 150.0–400.0)
RBC: 5.03 Mil/uL (ref 4.22–5.81)
RDW: 14.1 % (ref 11.5–15.5)
WBC: 6.1 10*3/uL (ref 4.0–10.5)

## 2016-01-22 LAB — LIPID PANEL
CHOL/HDL RATIO: 4
CHOLESTEROL: 148 mg/dL (ref 0–200)
HDL: 36.4 mg/dL — AB (ref >39.00)
LDL Cholesterol: 94 mg/dL (ref 0–99)
NonHDL: 111.51
TRIGLYCERIDES: 88 mg/dL (ref 0.0–149.0)
VLDL: 17.6 mg/dL (ref 0.0–40.0)

## 2016-01-22 LAB — TSH: TSH: 1.84 u[IU]/mL (ref 0.35–4.50)

## 2016-01-22 LAB — PSA: PSA: 0.32 ng/mL (ref 0.10–4.00)

## 2016-01-22 NOTE — Assessment & Plan Note (Signed)
1) Anticipatory Guidance: Discussed importance of wearing a seatbelt while driving and not texting while driving; changing batteries in smoke detector at least once annually; wearing suntan lotion when outside; eating a balanced and moderate diet; getting physical activity at least 30 minutes per day.  2) Immunizations / Screenings / Labs:  Influenza updated today. Discussed Zostavax with plan pending. All other immunizations are up-to-date per recommendations. Obtain PSA for prostate cancer screening. Obtain hepatitis C antibody for hepatitis C screening. All other screenings are up-to-date per recommendations. Obtain CBC, CMET, TSH, and lipid profile.    Overall well exam with risk factors for cardiovascular disease including obesity. Recommend weight loss of 5-10% of current body weight through nutrition and physical activity. Focus on decreasing saturated fats and processed/sugary foods while increasing monounsaturated and polyunsaturated fats. Increase nutrient dense foods. Physical activity with goal of 30 minutes of moderate level activity daily. Continue other healthy lifestyle behaviors and choices. Follow-up prevention exam in 1 year. Follow-up office visit pending blood work for chronic conditions.

## 2016-01-22 NOTE — Progress Notes (Signed)
Subjective:    Patient ID: SEWELL LLERENAS, male    DOB: 06/26/1955, 60 y.o.   MRN: [id number]  Chief Complaint  Patient presents with  . CPE    fasting    HPI:  SUKHRAJ OKE is a 60 y.o. male who presents today for an annual wellness visit.   1) Health Maintenance -   Diet - Averages about 3-4 meals per day consisting of a regular diet; Caffeine intake occasional. Occasional fast/processed foods.   Exercise - Generalized walking; no structured exercise   2) Preventative Exams / Immunizations:  Dental -- Up to date  Vision -- Up to date   Health Maintenance  Topic Date Due  . Hepatitis C Screening  March 03, 1956  . HIV Screening  08/29/1969  . ZOSTAVAX  08/30/2014  . INFLUENZA VACCINE  12/11/2015  . COLONOSCOPY  09/11/2019  . TETANUS/TDAP  07/22/2022    Immunization History  Administered Date(s) Administered  . Influenza Whole 02/14/2008, 02/13/2009, 02/03/2010  . Influenza,inj,Quad PF,36+ Mos 03/23/2013, 03/10/2014, 02/27/2015, 01/22/2016  . Influenza-Unspecified 03/13/2011  . Tdap 07/21/2012     No Known Allergies   Outpatient Medications Prior to Visit  Medication Sig Dispense Refill  . bimatoprost (LUMIGAN) 0.03 % ophthalmic solution Place 1 drop into both eyes at bedtime.      . Glucosamine-Chondroit-Vit C-Mn (GLUCOSAMINE 1500 COMPLEX) CAPS Take 1 capsule by mouth daily.      [name] latanoprost (XALATAN) 0.005 % ophthalmic solution 1 drop at bedtime.    [name] levothyroxine (SYNTHROID, LEVOTHROID) 100 MCG tablet Take 1 tablet by mouth  daily 90 tablet 1  . LOTEMAX 0.5 % ophthalmic suspension Place 1 drop into both eyes 2 (two) times daily.  1  . omeprazole (PRILOSEC) 40 MG capsule Take 1 capsule by mouth  daily 90 capsule 0  . dicyclomine (BENTYL) 20 MG tablet Take 1 tablet (20 mg total) by mouth 2 (two) times daily. 60 tablet 0  . HYDROcodone-acetaminophen (NORCO/VICODIN) 5-325 MG tablet Take 1-2 tablets by mouth every 4 (four) hours as needed. 12 tablet 0  .  ondansetron (ZOFRAN ODT) 4 MG disintegrating tablet Take 1 tablet (4 mg total) by mouth every 8 (eight) hours as needed for nausea or vomiting. 20 tablet 0   No facility-administered medications prior to visit.      Past Medical History:  Diagnosis Date  . Diastasis recti 07/21/2012   questionable umbilical hernia  . Glaucoma   . Personal history of diseases of skin and subcutaneous tissue   . Personal history of urinary calculi   . Unspecified hypothyroidism   . Unspecified sinusitis (chronic)      Past Surgical History:  Procedure Laterality Date  . Cataract surgery Bilateral   . TONSILLECTOMY AND ADENOIDECTOMY       Family History  Problem Relation Age of Onset  . Memory loss Mother   . Hypertension Mother   . Hyperlipidemia Mother   . Cancer Mother     breast cancer  . Cancer Father     prostate  . Diabetes Father     diet controlled  . Cleft lip Son   . Heart disease Maternal Grandmother   . Colon cancer Neg Hx      Social History   Social History  . Marital status: Married    Spouse name: N/A  . Number of children: 2  . Years of education: 15   Occupational History  . 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 Smoking status: Never Smoker  . Smokeless tobacco: Never Used  . Alcohol use No  . Drug use: No  . Sexual activity: Yes    Partners: Female   Other Topics Concern  . Not on file   Social History Narrative   [employer]. married - [place], 1 son 2003 (cleft lip/palate). wife is healthy.  work: Psychologist, occupational at [employer]: Denies fever, chills, fatigue, or significant weight gain/loss. HENT: Head: Denies headache or neck pain Ears: Denies changes in hearing, ringing in ears, earache, drainage Nose: Denies discharge, stuffiness, itching, nosebleed, sinus pain Throat: Denies sore throat, hoarseness, dry mouth, sores,  thrush Eyes: Denies loss/changes in vision, pain, redness, blurry/double vision, flashing lights Cardiovascular: Denies chest pain/discomfort, tightness, palpitations, shortness of breath with activity, difficulty lying down, swelling, sudden awakening with shortness of breath Respiratory: Denies shortness of breath, cough, sputum production, wheezing Gastrointestinal: Denies dysphasia, heartburn, change in appetite, nausea, change in bowel habits, rectal bleeding, constipation, diarrhea, yellow skin or eyes Genitourinary: Denies frequency, urgency, burning/pain, blood in urine, incontinence, change in urinary strength. Musculoskeletal: Denies muscle/joint pain, stiffness, back pain, redness or swelling of joints, trauma Skin: Denies rashes, lumps, itching, dryness, color changes, or hair/nail changes Neurological: Denies dizziness, fainting, seizures, weakness, numbness, tingling, tremor Psychiatric - Denies nervousness, stress, depression or memory loss Endocrine: Denies heat or cold intolerance, sweating, frequent urination, excessive thirst, changes in appetite Hematologic: Denies ease of bruising or bleeding     Objective:     BP 124/86 (BP Location: Left Arm, Patient Position: Sitting, Cuff Size: Normal)   Pulse 72   Temp 98 F (36.7 C) (Oral)   Resp 16   Ht 5\' 9"  (1.753 m)   Wt 204 lb 12.8 oz (92.9 kg)   SpO2 96%   BMI 30.24 kg/m  Nursing note and vital signs reviewed.  Physical Exam  Constitutional: He is oriented to person, place, and time. He appears well-developed and well-nourished.  HENT:  Head: Normocephalic.  Right Ear: Hearing, tympanic membrane, external ear and ear canal normal.  Left Ear: Hearing, tympanic membrane, external ear and ear canal normal.  Nose: Nose normal.  Mouth/Throat: Uvula is midline, oropharynx is clear and moist and mucous membranes are normal.  Eyes: Conjunctivae and EOM are normal. Pupils are equal, round, and reactive to light.  Neck:  Neck supple. No JVD present. No tracheal deviation present. No thyromegaly present.  Cardiovascular: Normal rate, regular rhythm, normal heart sounds and intact distal pulses.   Pulmonary/Chest: Effort normal and breath sounds normal.  Abdominal: Soft. Bowel sounds are normal. He exhibits no distension and no mass. There is no tenderness. There is no rebound and no guarding.  Musculoskeletal: Normal range of motion. He exhibits no edema or tenderness.  Lymphadenopathy:    He has no cervical adenopathy.  Neurological: He is alert and oriented to person, place, and time. He has normal reflexes. No cranial nerve deficit. He exhibits normal muscle tone. Coordination normal.  Skin: Skin is warm and dry.  Psychiatric: He has a normal mood and affect. His behavior is normal. Judgment and thought content normal.       Assessment & Plan:   Problem List Items Addressed This Visit      Other   Healthcare maintenance - Primary    1) Anticipatory Guidance: Discussed importance of wearing a seatbelt while driving and not texting  while driving; changing batteries in smoke detector at least once annually; wearing suntan lotion when outside; eating a balanced and moderate diet; getting physical activity at least 30 minutes per day.  2) Immunizations / Screenings / Labs:  Influenza updated today. Discussed Zostavax with plan pending. All other immunizations are up-to-date per recommendations. Obtain PSA for prostate cancer screening. Obtain hepatitis C antibody for hepatitis C screening. All other screenings are up-to-date per recommendations. Obtain CBC, CMET, TSH, and lipid profile.    Overall well exam with risk factors for cardiovascular disease including obesity. Recommend weight loss of 5-10% of current body weight through nutrition and physical activity. Focus on decreasing saturated fats and processed/sugary foods while increasing monounsaturated and polyunsaturated fats. Increase nutrient dense  foods. Physical activity with goal of 30 minutes of moderate level activity daily. Continue other healthy lifestyle behaviors and choices. Follow-up prevention exam in 1 year. Follow-up office visit pending blood work for chronic conditions.      Relevant Orders   CBC (Completed)   Comprehensive metabolic panel (Completed)   Hepatitis C antibody   Lipid panel (Completed)   PSA (Completed)   TSH (Completed)    Other Visit Diagnoses   None.      I have discontinued Mr. Lia's HYDROcodone-acetaminophen, ondansetron, and dicyclomine. I am also having him maintain his GLUCOSAMINE 1500 COMPLEX, bimatoprost, latanoprost, LOTEMAX, omeprazole, levothyroxine, Omega 3, and ketoconazole.   Meds ordered this encounter  Medications  . Omega 3 1200 MG CAPS    Sig: Take by mouth.  [name] ketoconazole (NIZORAL) 2 % cream    Sig: Apply 1 application topically daily.     Follow-up: Return if symptoms worsen or fail to improve.   Mauricio Po, FNP

## 2016-01-22 NOTE — Patient Instructions (Addendum)
Thank you for choosing [facility].  SUMMARY AND INSTRUCTIONS:  Medication:  Please continue to take your medications as prescribed.   Your prescription(s) have been submitted to your pharmacy or been printed and provided for you. Please take as directed and contact our office if you believe you are having problem(s) with the medication(s) or have any questions.  Labs:  Please stop by the lab on the lower level of the building for your blood work. Your results will be released to [facility] (or called to you) after review, usually within 72 hours after test completion. If any changes need to be made, you will be notified at that same time.  1.) The lab is open from 7:30am to 5:30 pm Monday-Friday 2.) No appointment is necessary 3.) Fasting (if needed) is 6-8 hours after food and drink; black coffee and water are okay   Follow up:  If your symptoms worsen or fail to improve, please contact our office for further instruction, or in case of emergency go directly to the emergency room at the closest medical facility.   Health Maintenance, Male A healthy lifestyle and preventative care can promote health and wellness.  Maintain regular health, dental, and eye exams.  Eat a healthy diet. Foods like vegetables, fruits, whole grains, low-fat dairy products, and lean protein foods contain the nutrients you need and are low in calories. Decrease your intake of foods high in solid fats, added sugars, and salt. Get information about a proper diet from your health care provider, if necessary.  Regular physical exercise is one of the most important things you can do for your health. Most adults should get at least 150 minutes of moderate-intensity exercise (any activity that increases your heart rate and causes you to sweat) each week. In addition, most adults need muscle-strengthening exercises on 2 or more days a week.   Maintain a healthy weight. The body mass index (BMI) is a screening  tool to identify possible weight problems. It provides an estimate of body fat based on height and weight. Your health care provider can find your BMI and can help you achieve or maintain a healthy weight. For males 20 years and older:  A BMI below 18.5 is considered underweight.  A BMI of 18.5 to 24.9 is normal.  A BMI of 25 to 29.9 is considered overweight.  A BMI of 30 and above is considered obese.  Maintain normal blood lipids and cholesterol by exercising and minimizing your intake of saturated fat. Eat a balanced diet with plenty of fruits and vegetables. Blood tests for lipids and cholesterol should begin at age 60 and be repeated every 5 years. If your lipid or cholesterol levels are high, you are over age 62, or you are at high risk for heart disease, you may need your cholesterol levels checked more frequently.Ongoing high lipid and cholesterol levels should be treated with medicines if diet and exercise are not working.  If you smoke, find out from your health care provider how to quit. If you do not use tobacco, do not start.  Lung cancer screening is recommended for adults aged 73-80 years who are at high risk for developing lung cancer because of a history of smoking. A yearly low-dose CT scan of the lungs is recommended for people who have at least a 30-pack-year history of smoking and are current smokers or have quit within the past 15 years. A pack year of smoking is smoking an average of 1 pack of cigarettes a  day for 1 year (for example, a 30-pack-year history of smoking could mean smoking 1 pack a day for 30 years or 2 packs a day for 15 years). Yearly screening should continue until the smoker has stopped smoking for at least 15 years. Yearly screening should be stopped for people who develop a health problem that would prevent them from having lung cancer treatment.  If you choose to drink alcohol, do not have more than 2 drinks per day. One drink is considered to be 12 oz  (360 mL) of beer, 5 oz (150 mL) of wine, or 1.5 oz (45 mL) of liquor.  Avoid the use of street drugs. Do not share needles with anyone. Ask for help if you need support or instructions about stopping the use of drugs.  High blood pressure causes heart disease and increases the risk of stroke. High blood pressure is more likely to develop in:  People who have blood pressure in the end of the normal range (100-139/85-89 mm Hg).  People who are overweight or obese.  People who are African American.  If you are 16-76 years of age, have your blood pressure checked every 3-5 years. If you are 8 years of age or older, have your blood pressure checked every year. You should have your blood pressure measured twice--once when you are at a hospital or clinic, and once when you are not at a hospital or clinic. Record the average of the two measurements. To check your blood pressure when you are not at a hospital or clinic, you can use:  An automated blood pressure machine at a pharmacy.  A home blood pressure monitor.  If you are 50-61 years old, ask your health care provider if you should take aspirin to prevent heart disease.  Diabetes screening involves taking a blood sample to check your fasting blood sugar level. This should be done once every 3 years after age 9 if you are at a normal weight and without risk factors for diabetes. Testing should be considered at a younger age or be carried out more frequently if you are overweight and have at least 1 risk factor for diabetes.  Colorectal cancer can be detected and often prevented. Most routine colorectal cancer screening begins at the age of 47 and continues through age 34. However, your health care provider may recommend screening at an earlier age if you have risk factors for colon cancer. On a yearly basis, your health care provider may provide home test kits to check for hidden blood in the stool. A small camera at the end of a tube may be used  to directly examine the colon (sigmoidoscopy or colonoscopy) to detect the earliest forms of colorectal cancer. Talk to your health care provider about this at age 32 when routine screening begins. A direct exam of the colon should be repeated every 5-10 years through age 13, unless early forms of precancerous polyps or small growths are found.  People who are at an increased risk for hepatitis B should be screened for this virus. You are considered at high risk for hepatitis B if:  You were born in a country where hepatitis B occurs often. Talk with your health care provider about which countries are considered high risk.  Your parents were born in a high-risk country and you have not received a shot to protect against hepatitis B (hepatitis B vaccine).  You have HIV or AIDS.  You use needles to inject street drugs.  You live  with, or have sex with, someone who has hepatitis B.  You are a man who has sex with other men (MSM).  You get hemodialysis treatment.  You take certain medicines for conditions like cancer, organ transplantation, and autoimmune conditions.  Hepatitis C blood testing is recommended for all people born from [birth year] through 1966 and any individual with known risk factors for hepatitis C.  Healthy men should no longer receive prostate-specific antigen (PSA) blood tests as part of routine cancer screening. Talk to your health care provider about prostate cancer screening.  Testicular cancer screening is not recommended for adolescents or adult males who have no symptoms. Screening includes self-exam, a health care provider exam, and other screening tests. Consult with your health care provider about any symptoms you have or any concerns you have about testicular cancer.  Practice safe sex. Use condoms and avoid high-risk sexual practices to reduce the spread of sexually transmitted infections (STIs).  You should be screened for STIs, including gonorrhea and chlamydia  if:  You are sexually active and are younger than 24 years.  You are older than 24 years, and your health care provider tells you that you are at risk for this type of infection.  Your sexual activity has changed since you were last screened, and you are at an increased risk for chlamydia or gonorrhea. Ask your health care provider if you are at risk.  If you are at risk of being infected with HIV, it is recommended that you take a prescription medicine daily to prevent HIV infection. This is called pre-exposure prophylaxis (PrEP). You are considered at risk if:  You are a man who has sex with other men (MSM).  You are a heterosexual man who is sexually active with multiple partners.  You take drugs by injection.  You are sexually active with a partner who has HIV.  Talk with your health care provider about whether you are at high risk of being infected with HIV. If you choose to begin PrEP, you should first be tested for HIV. You should then be tested every 3 months for as long as you are taking PrEP.  Use sunscreen. Apply sunscreen liberally and repeatedly throughout the day. You should seek shade when your shadow is shorter than you. Protect yourself by wearing long sleeves, pants, a wide-brimmed hat, and sunglasses year round whenever you are outdoors.  Tell your health care provider of new moles or changes in moles, especially if there is a change in shape or color. Also, tell your health care provider if a mole is larger than the size of a pencil eraser.  A one-time screening for abdominal aortic aneurysm (AAA) and surgical repair of large AAAs by ultrasound is recommended for men aged 52-75 years who are current or former smokers.  Stay current with your vaccines (immunizations).   This information is not intended to replace advice given to you by your health care provider. Make sure you discuss any questions you have with your health care provider.   Document Released:  10/25/2007 Document Revised: 05/19/2014 Document Reviewed: 09/23/2010 Elsevier Interactive Patient Education 2016 [place] Virus Vaccine Live injection What is this medicine? VARICELLA VIRUS VACCINE (var uh SEL uh VAHY ruhs vak SEEN) is used to prevent infections of chickenpox. HERPES ZOSTER VIRUS VACCINE (HUR peez ZOS ter vahy ruhs vak SEEN) is used to prevent shingles in adults 61 years old and over. This vaccine is not used to treat shingles or nerve pain from  shingles. These medicines may be used for other purposes; ask your health care provider or pharmacist if you have questions. This medicine may be used for other purposes; ask your health care provider or pharmacist if you have questions. What should I tell my health care provider before I take this medicine? They need to know if you have any of the following conditions: -blood disorders or disease -cancer like leukemia or lymphoma -immune system problems or therapy -infection with fever -recent immune globulin therapy -tuberculosis -an unusual or allergic reaction to vaccines, neomycin, gelatin, other medicines, foods, dyes, or preservatives -pregnant or trying to get pregnant -breast-feeding How should I use this medicine? These vaccines are for injection under the skin. They are given by a health care professional. A copy of Vaccine Information Statements will be given before each varicella virus vaccination. Read this sheet carefully each time. The sheet may change frequently. A Vaccine Information Statement is not given before the herpes zoster virus vaccine. Talk to your pediatrician regarding the use of the varicella virus vaccine in children. While this drug may be prescribed for children as young as 79 months of age for selected conditions, precautions do apply. The herpes zoster virus vaccine is not approved in children. Overdosage: If you think you have taken too much of this medicine contact a  poison control center or emergency room at once. NOTE: This medicine is only for you. Do not share this medicine with others. What if I miss a dose? Keep appointments for follow-up (booster) doses of varicella virus vaccine as directed. It is important not to miss your dose. Call your doctor or health care professional if you are unable to keep an appointment. Follow-up (booster) doses are not needed for the herpes zoster virus vaccine. What may interact with this medicine? Do not take these medicines with any of the following medications: -adalimumab -anakinra -etanercept -infliximab -medicines that suppress your immune system -medicines to treat cancer These medicines may also interact with the following medications: -aspirin and aspirin-like medicines (varicella virus vaccine only) -blood transfusions (varicella virus vaccine only) -immunoglobulins (varicella virus vaccine only) -steroid medicines like prednisone or cortisone This list may not describe all possible interactions. Give your health care provider a list of all the medicines, herbs, non-prescription drugs, or dietary supplements you use. Also tell them if you smoke, drink alcohol, or use illegal drugs. Some items may interact with your medicine. What should I watch for while using this medicine? Visit your doctor for regular check ups. These vaccines, like all vaccines, may not fully protect everyone. After receiving these vaccines it may be possible to pass chickenpox infection to others. For up to 6 weeks, avoid people with immune system problems, pregnant women who have not had chickenpox, newborns of women who have not had chickenpox, and all newborns born at less than 28 weeks of pregnancy. Talk to your doctor for more information. Do not become pregnant for 3 months after taking these vaccines. Women should inform their doctor if they wish to become pregnant or think they might be pregnant. There is a potential for serious  side effects to an unborn child. Talk to your health care professional or pharmacist for more information. What side effects may I notice from receiving this medicine? Side effects that you should report to your doctor or health care professional as soon as possible: -allergic reactions like skin rash, itching or hives, swelling of the face, lips, or tongue -breathing problems -extreme changes in behavior -feeling faint  or lightheaded, falls -fever over 102 degrees F -pain, tingling, numbness in the hands or feet -redness, blistering, peeling or loosening of the skin, including inside the mouth -seizures -unusually weak or tired Side effects that usually do not require medical attention (report to your doctor or health care professional if they continue or are bothersome): -aches or pains -chickenpox-like rash -diarrhea -headache -low-grade fever under 102 degrees F -loss of appetite -nausea, vomiting -redness, pain, swelling at site where injected -sleepy -trouble sleeping This list may not describe all possible side effects. Call your doctor for medical advice about side effects. You may report side effects to FDA at 1-800-FDA-1088. Where should I keep my medicine? These drugs are given in a hospital or clinic and will not be stored at home. NOTE: This sheet is a summary. It may not cover all possible information. If you have questions about this medicine, talk to your doctor, pharmacist, or health care provider.    2016, Elsevier/Gold Standard. (2012-12-31 14:24:35)

## 2016-01-23 LAB — HEPATITIS C ANTIBODY: HCV Ab: NEGATIVE

## 2016-04-18 ENCOUNTER — Ambulatory Visit (INDEPENDENT_AMBULATORY_CARE_PROVIDER_SITE_OTHER): Payer: 59 | Admitting: Internal Medicine

## 2016-04-18 ENCOUNTER — Encounter: Payer: Self-pay | Admitting: Internal Medicine

## 2016-04-18 VITALS — BP 124/80 | HR 98 | Temp 98.5°F | Resp 16 | Wt 210.0 lb

## 2016-04-18 DIAGNOSIS — J209 Acute bronchitis, unspecified: Secondary | ICD-10-CM

## 2016-04-18 DIAGNOSIS — R062 Wheezing: Secondary | ICD-10-CM | POA: Diagnosis not present

## 2016-04-18 MED ORDER — HYDROCODONE-HOMATROPINE 5-1.5 MG/5ML PO SYRP: 5.0000 mL | ORAL_SOLUTION | Freq: Four times a day (QID) | ORAL | 0 refills | Status: DC | PRN

## 2016-04-18 MED ORDER — PREDNISONE 10 MG PO TABS: ORAL_TABLET | ORAL | 0 refills | Status: DC

## 2016-04-18 MED ORDER — DOXYCYCLINE HYCLATE 100 MG PO TABS: 100.0000 mg | ORAL_TABLET | Freq: Two times a day (BID) | ORAL | 0 refills | Status: DC

## 2016-04-18 NOTE — Progress Notes (Signed)
Pre visit review using our clinic review tool, if applicable. No additional management support is needed unless otherwise documented below in the visit note. 

## 2016-04-18 NOTE — Patient Instructions (Signed)

## 2016-04-18 NOTE — Progress Notes (Signed)
Subjective:    Patient ID: Jay Holmes, male    DOB: 1955/07/05, 60 y.o.   MRN: [id number]  HPI He is here for an acute visit for cold symptoms.  His symptoms started last week.  He does have a history of bronchitis and pneumonia and was concerned about developing those.  He has had fatigue, nasal congestion, hoarseness or raspy voice, his eyes itch, cough and he is able to bring phlegm up, wheezing and headaches. He denies any fever, shortness of breath or chest pain. He denies any gastrointestinal symptoms.  He has tried taking advil, delsym cough syrup.  The cough over-the-counter cough syrup does not help.    Medications and allergies reviewed with patient and updated if appropriate.  Patient Active Problem List   Diagnosis Date Noted  . Abdominal bloating 08/13/2015  . Poison ivy dermatitis 01/18/2015  . Discolored nails 01/05/2015  . Umbilical hernia 0000000  . Tick bite of abdomen 08/13/2013  . Diastasis recti 07/21/2012  . Healthcare maintenance 07/21/2012  . Acute bronchitis 04/04/2011  . Wheezing 04/04/2011  . Glaucoma 08/14/2009  . GERD 07/16/2009  . Blood in stool 07/16/2009  . BACK PAIN, LUMBAR 03/01/2009  . Overweight(278.02) 04/18/2008  . HEARING LOSS 03/30/2008  . Hypothyroidism 04/14/2007  . NEPHROLITHIASIS, HX OF 04/14/2007  . ALOPECIA, HX OF 04/14/2007  . TONSILLECTOMY AND ADENOIDECTOMY, HX OF 04/14/2007    Current Outpatient Prescriptions on File Prior to Visit  Medication Sig Dispense Refill  . bimatoprost (LUMIGAN) 0.03 % ophthalmic solution Place 1 drop into both eyes at bedtime.      . Glucosamine-Chondroit-Vit C-Mn (GLUCOSAMINE 1500 COMPLEX) CAPS Take 1 capsule by mouth daily.      [name] ketoconazole (NIZORAL) 2 % cream Apply 1 application topically daily.    [name] latanoprost (XALATAN) 0.005 % ophthalmic solution 1 drop at bedtime.    [name] levothyroxine (SYNTHROID, LEVOTHROID) 100 MCG tablet Take 1 tablet by mouth  daily 90 tablet 1  . LOTEMAX  0.5 % ophthalmic suspension Place 1 drop into both eyes 2 (two) times daily.  1  . Omega 3 1200 MG CAPS Take by mouth.    [name] omeprazole (PRILOSEC) 40 MG capsule Take 1 capsule by mouth  daily 90 capsule 0   No current facility-administered medications on file prior to visit.     Past Medical History:  Diagnosis Date  . Diastasis recti 07/21/2012   questionable umbilical hernia  . Glaucoma   . Personal history of diseases of skin and subcutaneous tissue   . Personal history of urinary calculi   . Unspecified hypothyroidism   . Unspecified sinusitis (chronic)     Past Surgical History:  Procedure Laterality Date  . Cataract surgery Bilateral   . TONSILLECTOMY AND ADENOIDECTOMY      Social History   Social History  . Marital status: Married    Spouse name: N/A  . Number of children: 2  . Years of education: 15   Occupational History  . Civil engineer, contracting - retired [employer]   Social History Main Topics  . Smoking status: Never Smoker  . Smokeless tobacco: Never Used  . Alcohol use No  . Drug use: No  . Sexual activity: Yes    Partners: Female   Other Topics Concern  . Not on file   Social History Narrative   [employer]. married - [place], 1 son 2003 (cleft lip/palate). wife is healthy.  work: TEFL teacher  officer at [employer] History  Problem Relation Age of Onset  . Memory loss Mother   . Hypertension Mother   . Hyperlipidemia Mother   . Cancer Mother     breast cancer  . Cancer Father     prostate  . Diabetes Father     diet controlled  . Cleft lip Son   . Heart disease Maternal Grandmother   . Colon cancer Neg Hx     Review of Systems  Constitutional: Positive for fatigue. Negative for appetite change, chills and fever.  HENT: Positive for congestion and voice change. Negative for ear pain, rhinorrhea, sinus pain and sinus pressure.   Eyes: Positive for itching.  Respiratory: Positive for cough and  wheezing. Negative for shortness of breath.   Cardiovascular: Negative for chest pain.  Gastrointestinal: Negative for abdominal pain, diarrhea and nausea.  Neurological: Positive for headaches. Negative for dizziness and light-headedness.       Objective:   Vitals:   04/18/16 1626  BP: 124/80  Pulse: 98  Resp: 16  Temp: 98.5 F (36.9 C)   Filed Weights   04/18/16 1626  Weight: 210 lb (95.3 kg)   Body mass index is 31.01 kg/m.   Physical Exam GENERAL APPEARANCE: Appears stated age, moderately ill appearing, NAD EYES: conjunctiva clear, no icterus HEENT: bilateral tympanic membranes and ear canals normal, oropharynx with mild erythema, no thyromegaly, trachea midline, no cervical or supraclavicular lymphadenopathy LUNGS: Unlabored breathing, good air entry bilaterally, diffuse wheeze throughout lungs, no obvious crackles HEART: Normal S1,S2 without murmurs EXTREMITIES: Without clubbing, cyanosis, or edema        Assessment & Plan:    See Problem List for Assessment and Plan of chronic medical problems.

## 2016-04-19 NOTE — Assessment & Plan Note (Signed)
His symptoms are moderate in nature and consistent with probable bronchitis with reactive airway disease Prednisone taper Doxycycline Hycodan cough syrup as needed Continue rest and fluids and over-the-counter cold medications. Intermittent relief Call if no improvement or any worsening of his symptoms

## 2016-04-19 NOTE — Assessment & Plan Note (Signed)
Acute bronchitis with wheezing-likely reactive airway disease In addition to antibiotic and cough syrup will give prednisone taper-he has taken this in the past and it has been tolerated Advised to take in the morning with food-discussed possible side effects

## 2016-05-01 ENCOUNTER — Other Ambulatory Visit: Payer: Self-pay | Admitting: Family

## 2016-06-03 ENCOUNTER — Encounter: Payer: Self-pay | Admitting: Family

## 2016-06-03 ENCOUNTER — Ambulatory Visit (INDEPENDENT_AMBULATORY_CARE_PROVIDER_SITE_OTHER): Payer: 59 | Admitting: Family

## 2016-06-03 DIAGNOSIS — S46219A Strain of muscle, fascia and tendon of other parts of biceps, unspecified arm, initial encounter: Secondary | ICD-10-CM | POA: Insufficient documentation

## 2016-06-03 DIAGNOSIS — S46211A Strain of muscle, fascia and tendon of other parts of biceps, right arm, initial encounter: Secondary | ICD-10-CM

## 2016-06-03 MED ORDER — IBUPROFEN-FAMOTIDINE 800-26.6 MG PO TABS: 1.0000 | ORAL_TABLET | Freq: Three times a day (TID) | ORAL | 1 refills | Status: DC | PRN

## 2016-06-03 MED ORDER — DICLOFENAC SODIUM 2 % TD SOLN: 1.0000 "application " | Freq: Two times a day (BID) | TRANSDERMAL | 1 refills | Status: DC | PRN

## 2016-06-03 NOTE — Progress Notes (Signed)
Subjective:    Patient ID: Jay Holmes, male    DOB: October 22, 1955, 61 y.o.   MRN: [id number]  Chief Complaint  Patient presents with  . Shoulder Pain    pain in right shoulder, felt like a blood pressure cuff was on his arm and had some tingling in fingers x5 weeks    HPI:  Jay Holmes is a 61 y.o. male who  has a past medical history of Diastasis recti (07/21/2012); Glaucoma; Personal history of diseases of skin and subcutaneous tissue; Personal history of urinary calculi; Unspecified hypothyroidism; and Unspecified sinusitis (chronic). and presents today for an office visit.  This is a new problem. Associated symptom of pain located in his right upper arm has been going on for about 5 weeks. Described as sharp on occasion but mostly dull and achy.Over the last couple of days if feels like there is a blood pressure cuff around it. Has also had some numbness and tingling in his fingers. Denies trauma or injury.  Modifying factors include ibuprofen which has helped but dulling the pain a little. No previous history of injury to either arm that he recalls. Denies sounds/sensations heard or felt.  No Known Allergies    Outpatient Medications Prior to Visit  Medication Sig Dispense Refill  . bimatoprost (LUMIGAN) 0.03 % ophthalmic solution Place 1 drop into both eyes at bedtime.      [name] doxycycline (VIBRA-TABS) 100 MG tablet Take 1 tablet (100 mg total) by mouth 2 (two) times daily. 20 tablet 0  . Glucosamine-Chondroit-Vit C-Mn (GLUCOSAMINE 1500 COMPLEX) CAPS Take 1 capsule by mouth daily.      [name] ketoconazole (NIZORAL) 2 % cream Apply 1 application topically daily.    [name] latanoprost (XALATAN) 0.005 % ophthalmic solution 1 drop at bedtime.    [name] levothyroxine (SYNTHROID, LEVOTHROID) 100 MCG tablet Take 1 tablet by mouth  daily 90 tablet 1  . LOTEMAX 0.5 % ophthalmic suspension Place 1 drop into both eyes 2 (two) times daily.  1  . Omega 3 1200 MG CAPS Take by mouth.    [name] omeprazole  (PRILOSEC) 40 MG capsule TAKE 1 CAPSULE BY MOUTH  DAILY 90 capsule 0  . HYDROcodone-homatropine (HYCODAN) 5-1.5 MG/5ML syrup Take 5 mLs by mouth every 6 (six) hours as needed for cough. 120 mL 0  . predniSONE (DELTASONE) 10 MG tablet Take 4 tabs po qd x 3 days, then 3 tabs po qd x 3 days, then 2 tabs po qd x 3 days, then 1 tab po qd x 3 days 30 tablet 0   No facility-administered medications prior to visit.       Past Surgical History:  Procedure Laterality Date  . Cataract surgery Bilateral   . TONSILLECTOMY AND ADENOIDECTOMY        Past Medical History:  Diagnosis Date  . Diastasis recti 07/21/2012   questionable umbilical hernia  . Glaucoma   . Personal history of diseases of skin and subcutaneous tissue   . Personal history of urinary calculi   . Unspecified hypothyroidism   . Unspecified sinusitis (chronic)     Review of Systems  Constitutional: Negative for chills and fever.  Respiratory: Negative for chest tightness and shortness of breath.   Musculoskeletal:       Positive for right arm pain.   Neurological: Negative for weakness and numbness.       Positive for occasional tingling.       Objective:    BP 120/80 (BP Location:  Left Arm, Patient Position: Sitting, Cuff Size: Normal)   Pulse 82   Temp 98.4 F (36.9 C) (Oral)   Resp 16   Ht 5\' 9"  (1.753 m)   Wt 214 lb (97.1 kg)   SpO2 96%   BMI 31.60 kg/m  Nursing note and vital signs reviewed.  Physical Exam  Constitutional: He is oriented to person, place, and time. He appears well-developed and well-nourished. No distress.  Cardiovascular: Normal rate, regular rhythm, normal heart sounds and intact distal pulses.   Pulmonary/Chest: Effort normal and breath sounds normal.  Musculoskeletal:  Right arm - no obvious deformity, discoloration, or edema. Mild tenderness elicited over bicep muscle belly with no crepitus or deformity. Shoulder and elbow range of motion is within normal limits. There is discomfort  noted in shoulder flexion with supination and elbow flexion. Pain is decreased passively. Distal pulses and sensation are intact and appropriate.  Neurological: He is alert and oriented to person, place, and time.  Skin: Skin is warm and dry.  Psychiatric: He has a normal mood and affect. His behavior is normal. Judgment and thought content normal.       Assessment & Plan:   Problem List Items Addressed This Visit      Musculoskeletal and Integument   Biceps strain    Symptoms and exam consistent with biceps strain with no crepitus or deformity. Treat conservatively with ice, home exercise therapy, and start Pennsaid and Duexis. Appears to be primarily muscle related with little concern for blood clot. Follow-up if symptoms worsen or do not improve with conservative treatment.      Relevant Medications   Ibuprofen-Famotidine 800-26.6 MG TABS   Diclofenac Sodium (PENNSAID) 2 % SOLN       I have discontinued Mr. Lollis's predniSONE and HYDROcodone-homatropine. I am also having him start on Ibuprofen-Famotidine and Diclofenac Sodium. Additionally, I am having him maintain his GLUCOSAMINE 1500 COMPLEX, bimatoprost, latanoprost, LOTEMAX, levothyroxine, Omega 3, ketoconazole, doxycycline, and omeprazole.   Meds ordered this encounter  Medications  . Ibuprofen-Famotidine 800-26.6 MG TABS    Sig: Take 1 tablet by mouth 3 (three) times daily as needed.    Dispense:  90 tablet    Refill:  1    Order Specific Question:   Supervising Provider    Answer:   Pricilla Holm A [id number]  . Diclofenac Sodium (PENNSAID) 2 % SOLN    Sig: Place 1 application onto the skin 2 (two) times daily as needed.    Dispense:  112 g    Refill:  1    Order Specific Question:   Supervising Provider    Answer:   Pricilla Holm A [id number]     Follow-up: Return in about 3 weeks (around 06/24/2016), or if symptoms worsen or fail to improve.  Mauricio Po, FNP

## 2016-06-03 NOTE — Assessment & Plan Note (Signed)
Symptoms and exam consistent with biceps strain with no crepitus or deformity. Treat conservatively with ice, home exercise therapy, and start Pennsaid and Duexis. Appears to be primarily muscle related with little concern for blood clot. Follow-up if symptoms worsen or do not improve with conservative treatment.

## 2016-06-03 NOTE — Patient Instructions (Signed)
Thank you for choosing [facility]!  Ice  x 20 minutes every 2 hours and as needed or following activity  Pennsaid - Approximately 1/2 packet to the affected site twice daily.  Duexis - 1 tablet 3 times per day for the next 5-7 days and then as needed.  Exercises 1-2 times per day as instructed.   You will receive a call to schedule your imaging, appointment or physical therapy.   If your symptoms worsen or fail to improve, please contact our office for further instruction, or in case of emergency go directly to the emergency room at the closest medical facility.    Biceps Tendon Tendinitis (Distal) Rehab Ask your health care provider which exercises are safe for you. Do exercises exactly as told by your health care provider and adjust them as directed. It is normal to feel mild stretching, pulling, tightness, or discomfort as you do these exercises, but you should stop right away if you feel sudden pain or your pain gets worse.Do not begin these exercises until told by your health care provider. Stretching and range of motion exercises These exercises warm up your muscles and joints and improve the movement and flexibility of your arm. These exercises can also help to relieve pain and stiffness. Exercise A: Supination, active-assisted 1. Stand or sit with your left / right elbow bent to an "L" shape (90 degrees). 2. Rotate your palm up until you cannot rotate it anymore. Then, use your other hand to help rotate your left / right forearm more. 3. Hold this position for __________ seconds. 4. Slowly return to the starting position. Repeat __________ times. Complete this exercise __________ times a day. Exercise B: Pronation, active-assisted 1. Stand or sit with your left / right elbow bent to an "L" shape (90 degrees). 2. Rotate your left / right palm down until you cannot rotate it anymore. Then, use your other hand to help rotate your left / right forearm more. 3. Hold this  position for __________ seconds. 4. Slowly return to the starting position. Repeat __________ times. Complete this exercise __________ times a day. Strengthening exercises These exercises build strength and endurance in your arm and shoulder. Endurance is the ability to use your muscles for a long time, even after your muscles get tired. Exercise C: Supination 1. Sit with your left / right forearm supported on a table. Your elbow should be at waist height. 2. Rest your hand over the edge of the table, palm-down. 3. Gently grasp a lightweight hammer near the head. As this exercise gets easier for you, try holding the hammer farther down the handle. 4. Without moving your elbow, slowly rotate your palm up so it faces the ceiling. 5. Hold this position for__________ seconds. 6. Slowly return to the starting position. Repeat __________ times. Complete this exercise __________ times a day. Exercise D: Pronation 1. Sit with your left / right forearm supported on a table. Your elbow should be at waist height. 2. Rest your hand over the edge of the table, palm-up. 3. Gently grasp a lightweight hammer near the head. As this exercise gets easier for you, try holding the hammer farther down the handle. 4. Without moving your elbow, slowly rotate your palm down. 5. Hold this position for __________ seconds. 6. Slowly return to the starting position. Repeat __________ times. Complete this exercise __________ times a day. Exercise E: Elbow flexion, supinated 1. Sit on a stable chair without armrests, or stand. 2. If directed, hold a __________ weight  in your left / right hand, or hold an exercise band with both hands. Your palms should face up toward the ceiling at the starting position. 3. Bend your left / right elbow and move your hand up toward your shoulder. Keep your other arm straight down, in the starting position. 4. Slowly return to the starting position. Repeat __________ times. Complete this  exercise __________ times a day. Exercise F: Elbow extension 1. Lie on your back. 2. Hold a __________ weight in your left / right hand. 3. Bend your left / right elbow to an "L" shape (90 degrees) so your elbow is pointed up to the ceiling and the weight is overhead. 4. Straighten your elbow, raising your hand toward the ceiling. Use your other hand to support your left / right upper arm and to keep it still. 5. Slowly return to the starting position. Repeat __________ times. Complete this exercise __________ times a day. This information is not intended to replace advice given to you by your health care provider. Make sure you discuss any questions you have with your health care provider. Document Released: 04/28/2005 Document Revised: 01/03/2016 Document Reviewed: 04/06/2015 Elsevier Interactive Patient Education  2017 [employer].

## 2016-06-04 ENCOUNTER — Ambulatory Visit (INDEPENDENT_AMBULATORY_CARE_PROVIDER_SITE_OTHER): Payer: 59 | Admitting: Orthopedic Surgery

## 2016-06-04 DIAGNOSIS — H401132 Primary open-angle glaucoma, bilateral, moderate stage: Secondary | ICD-10-CM | POA: Diagnosis not present

## 2016-06-04 DIAGNOSIS — Z961 Presence of intraocular lens: Secondary | ICD-10-CM | POA: Diagnosis not present

## 2016-06-11 ENCOUNTER — Encounter (INDEPENDENT_AMBULATORY_CARE_PROVIDER_SITE_OTHER): Payer: Self-pay | Admitting: Orthopedic Surgery

## 2016-06-11 ENCOUNTER — Ambulatory Visit (INDEPENDENT_AMBULATORY_CARE_PROVIDER_SITE_OTHER): Payer: 59 | Admitting: Orthopedic Surgery

## 2016-06-11 ENCOUNTER — Ambulatory Visit (INDEPENDENT_AMBULATORY_CARE_PROVIDER_SITE_OTHER): Payer: 59

## 2016-06-11 VITALS — BP 128/82 | HR 76 | Resp 14 | Ht 69.0 in | Wt 210.0 lb

## 2016-06-11 DIAGNOSIS — G8929 Other chronic pain: Secondary | ICD-10-CM

## 2016-06-11 DIAGNOSIS — M7541 Impingement syndrome of right shoulder: Secondary | ICD-10-CM

## 2016-06-11 DIAGNOSIS — M25511 Pain in right shoulder: Secondary | ICD-10-CM

## 2016-06-11 MED ORDER — METHYLPREDNISOLONE ACETATE 40 MG/ML IJ SUSP
80.0000 mg | INTRAMUSCULAR | Status: AC | PRN
  Administered 2016-06-11: 80 mg

## 2016-06-11 MED ORDER — BUPIVACAINE HCL 0.5 % IJ SOLN
2.0000 mL | INTRAMUSCULAR | Status: AC | PRN
  Administered 2016-06-11: 2 mL via INTRA_ARTICULAR

## 2016-06-11 MED ORDER — LIDOCAINE HCL 1 % IJ SOLN
2.0000 mL | INTRAMUSCULAR | Status: AC | PRN
  Administered 2016-06-11: 2 mL

## 2016-06-11 NOTE — Progress Notes (Signed)
Office Visit Note   Patient: Jay Holmes           Date of Birth: January 03, 1956           MRN: [id number] Visit Date: 06/11/2016              Requested by: [address], [place], [place] [address] PCP: Mauricio Po, FNP   Assessment & Plan: Visit Diagnoses:  1. Impingement syndrome of right shoulder   2. Chronic right shoulder pain     Plan:  #1: Corticosteroid injection subacromially to the right shoulder #2: If this is not beneficial he will give [place] a call and we'll schedule him for an MRI scan of the right shoulder to rule out a rotator cuff tear.  Follow-Up Instructions: Return if symptoms worsen or fail to improve.   Orders:  Orders Placed This Encounter  Procedures  . Large Joint Injection/Arthrocentesis  . XR Shoulder Right   No orders of the defined types were placed in this encounter.     Procedures: Large Joint Inj Date/Time: 06/11/2016 10:01 AM Performed by: Biagio Borg D Authorized by: Biagio Borg D   Consent Given by:  Patient Timeout: prior to procedure the correct patient, procedure, and site was verified   Indications:  Pain Location:  Shoulder Site:  L subacromial bursa Prep: patient was prepped and draped in usual sterile fashion   Needle Size:  25 G Needle Length:  1.5 inches Approach:  Lateral Ultrasound Guidance: No   Fluoroscopic Guidance: No   Arthrogram: No   Medications:  80 mg methylPREDNISolone acetate 40 MG/ML; 2 mL lidocaine 1 %; 2 mL bupivacaine 0.5 % Aspiration Attempted: No   Patient tolerance:  Patient tolerated the procedure well with no immediate complications     Clinical Data: No additional findings.   Subjective: Chief Complaint  Patient presents with  . Right Shoulder - Pain    This is a very pleasant 61 year old white male who is seen today for evaluation of his right shoulder. He has had a several month history of pain older mainly in the proximal humeral area. He finds that  when he is trying to pitch baseball to his son that his pain actually exacerbates. He is now to the point where he has to throw underhand set of overhand. He denies any history of injury or trauma. This or tingling into his hand or arm. Pain stays in the proximal portion of the humeral area. He also complains of pain with range of motion. Denies any cervical spine pain. Difficulty with sleeping. Ibuprofen has not been very beneficial for him. No previous surgeries or injury noted.      Review of Systems  Constitutional: Negative.   HENT: Negative.   Respiratory: Negative.   Cardiovascular: Negative.   Gastrointestinal: Negative.   Endocrine:       History of thyroid disease  Genitourinary:       History of renal calculi  Skin: Negative.   Neurological: Negative.   Hematological: Negative.   Psychiatric/Behavioral: Negative.      Objective: Vital Signs: BP 128/82 (BP Location: Left Arm, Patient Position: Sitting, Cuff Size: Normal)   Pulse 76   Resp 14   Ht 5\' 9"  (1.753 m)   Wt 210 lb (95.3 kg)   BMI 31.01 kg/m   Physical Exam  Constitutional: He is oriented to person, place, and time. He appears well-developed and well-nourished.  HENT:  Head: Normocephalic and atraumatic.  Eyes: EOM are normal. Pupils are equal, round, and reactive to light.  Pulmonary/Chest: Effort normal.  Neurological: He is alert and oriented to person, place, and time.  Skin: Skin is warm and dry.  Psychiatric: He has a normal mood and affect. His behavior is normal. Judgment and thought content normal.    Right Shoulder Exam   Tenderness  The patient is experiencing no tenderness.    Range of Motion  Active Abduction: 130  Passive Abduction: 140  Forward Flexion: 160  External Rotation: 90  Internal Rotation 90 degrees: 50   Muscle Strength  Abduction: 3/5  Internal Rotation: 4/5  External Rotation: 3/5  Supraspinatus: 4/5  Biceps: 4/5   Tests  Apprehension: negative Drop Arm:  negative Impingement: positive  Other  Sensation: normal Pulse: present      Specialty Comments:  No specialty comments available.  Imaging: Xr Shoulder Right  Result Date: 06/11/2016 Two-view x-ray right shoulder reveals type II acromion. Maybe some inferior spurring of the glenoid. In the greater tuberosity. Mild OA of the before meals joint.    PMFS History: Patient Active Problem List   Diagnosis Date Noted  . Biceps strain 06/03/2016  . Abdominal bloating 08/13/2015  . Poison ivy dermatitis 01/18/2015  . Discolored nails 01/05/2015  . Umbilical hernia 0000000  . Tick bite of abdomen 08/13/2013  . Diastasis recti 07/21/2012  . Healthcare maintenance 07/21/2012  . Acute bronchitis 04/04/2011  . Wheezing 04/04/2011  . Glaucoma 08/14/2009  . GERD 07/16/2009  . Blood in stool 07/16/2009  . BACK PAIN, LUMBAR 03/01/2009  . Overweight(278.02) 04/18/2008  . HEARING LOSS 03/30/2008  . Hypothyroidism 04/14/2007  . NEPHROLITHIASIS, HX OF 04/14/2007  . ALOPECIA, HX OF 04/14/2007  . TONSILLECTOMY AND ADENOIDECTOMY, HX OF 04/14/2007   Past Medical History:  Diagnosis Date  . Diastasis recti 07/21/2012   questionable umbilical hernia  . Glaucoma   . Personal history of diseases of skin and subcutaneous tissue   . Personal history of urinary calculi   . Unspecified hypothyroidism   . Unspecified sinusitis (chronic)     Family History  Problem Relation Age of Onset  . Memory loss Mother   . Hypertension Mother   . Hyperlipidemia Mother   . Cancer Mother     breast cancer  . Cancer Father     prostate  . Diabetes Father     diet controlled  . Cleft lip Son   . Heart disease Maternal Grandmother   . Colon cancer Neg Hx     Past Surgical History:  Procedure Laterality Date  . Cataract surgery Bilateral   . TONSILLECTOMY AND ADENOIDECTOMY     Social History   Occupational History  . Civil engineer, contracting - retired [employer]    Social History Main Topics  . Smoking status: Never Smoker  . Smokeless tobacco: Never Used  . Alcohol use No  . Drug use: No  . Sexual activity: Yes    Partners: Female

## 2016-07-15 DIAGNOSIS — L821 Other seborrheic keratosis: Secondary | ICD-10-CM | POA: Diagnosis not present

## 2016-07-15 DIAGNOSIS — L720 Epidermal cyst: Secondary | ICD-10-CM | POA: Diagnosis not present

## 2016-07-15 DIAGNOSIS — Z85828 Personal history of other malignant neoplasm of skin: Secondary | ICD-10-CM | POA: Diagnosis not present

## 2016-07-15 DIAGNOSIS — L570 Actinic keratosis: Secondary | ICD-10-CM | POA: Diagnosis not present

## 2016-07-25 ENCOUNTER — Other Ambulatory Visit: Payer: Self-pay | Admitting: Family

## 2016-08-19 ENCOUNTER — Ambulatory Visit (INDEPENDENT_AMBULATORY_CARE_PROVIDER_SITE_OTHER): Payer: 59

## 2016-08-19 ENCOUNTER — Encounter (INDEPENDENT_AMBULATORY_CARE_PROVIDER_SITE_OTHER): Payer: Self-pay | Admitting: Orthopaedic Surgery

## 2016-08-19 ENCOUNTER — Ambulatory Visit (INDEPENDENT_AMBULATORY_CARE_PROVIDER_SITE_OTHER): Payer: 59 | Admitting: Orthopaedic Surgery

## 2016-08-19 VITALS — Ht 69.0 in | Wt 200.0 lb

## 2016-08-19 DIAGNOSIS — M25561 Pain in right knee: Secondary | ICD-10-CM

## 2016-08-19 DIAGNOSIS — G8929 Other chronic pain: Secondary | ICD-10-CM | POA: Diagnosis not present

## 2016-08-19 MED ORDER — BUPIVACAINE HCL 0.5 % IJ SOLN
3.0000 mL | INTRAMUSCULAR | Status: AC | PRN
  Administered 2016-08-19: 3 mL via INTRA_ARTICULAR

## 2016-08-19 MED ORDER — LIDOCAINE HCL 1 % IJ SOLN
5.0000 mL | INTRAMUSCULAR | Status: AC | PRN
  Administered 2016-08-19: 5 mL

## 2016-08-19 MED ORDER — METHYLPREDNISOLONE ACETATE 40 MG/ML IJ SUSP
80.0000 mg | INTRAMUSCULAR | Status: AC | PRN
  Administered 2016-08-19: 80 mg

## 2016-08-19 NOTE — Progress Notes (Deleted)
This is a very pleasant 61 year old white male who is seen today for evaluation of his right knee. He has had a several month history of pain older mainly in the . Difficulty with sleeping. Ibuprofen has not been very beneficial for him. No previous surgeries or

## 2016-08-19 NOTE — Progress Notes (Signed)
Office Visit Note   Patient: Jay Holmes           Date of Birth: Feb 08, 1956           MRN: [id number] Visit Date: 08/19/2016              Requested by: Jay Holmes, [place], [place] [address] PCP: Jay Po, FNP   Assessment & Plan: Visit Diagnoses:  1. Chronic pain of right knee   Degenerative arthrosis medial compartment right knee-possible internal derangement. We'll plan on cortisone injection and monitor her response. In [place] MRI scan at some point in the future  Plan: Cortisone injection right knee  Follow-Up Instructions: Return if symptoms worsen or fail to improve.   Orders:  Orders Placed This Encounter  Procedures  . XR KNEE 3 VIEW RIGHT   No orders of the defined types were placed in this encounter.     Procedures: Large Joint Inj Date/Time: 08/19/2016 1:45 PM Performed by: Jay Holmes Authorized by: Jay Holmes   Consent Given by:  Patient Timeout: prior to procedure the correct patient, procedure, and site was verified   Indications:  Pain and joint swelling Location:  Knee Site:  R knee Prep: patient was prepped and draped in usual sterile fashion   Needle Size:  25 G Needle Length:  1.5 inches Approach:  Anteromedial Ultrasound Guidance: No   Fluoroscopic Guidance: No   Arthrogram: No   Medications:  5 mL lidocaine 1 %; 80 mg methylPREDNISolone acetate 40 MG/ML; 3 mL bupivacaine 0.5 % Aspiration Attempted: No   Patient tolerance:  Patient tolerated the procedure well with no immediate complications     Clinical Data: No additional findings.   Subjective: Chief Complaint  Patient presents with  . Right Knee - Pain  Jay Holmes noted initial onset of right knee pain approximately 2 weeks ago after taking a long walk. He denies any obvious injury or trauma. He initially felt the pain along the medial joint line without obvious swelling. Since that time the pain has persisted he has a difficult time  when he stands for long period time. He's not had any locking or catching. He denies any obvious swelling. He also denies any pain referred from his groin and thigh or back. Family history is positive for osteoarthritis. Sister had bilateral knee replacements.  HPI  Review of Systems   Objective: Vital Signs: Ht 5\' 9"  (1.753 m)   Wt 200 lb (90.7 kg)   BMI 29.53 kg/m   Physical Exam  Ortho Exam right knee with full flexion and extension. Small effusion. No opening with varus and valgus stress. Mild to moderate medial joint pain predominantly along the anterior half. No lateral joint pain. Minimal patellar crepitation without pain. No popliteal pain or fullness. No calf pain. Neurovascular exam intact.  Specialty Comments:  No specialty comments available.  Imaging: Xr Knee 3 View Right  Result Date: 08/19/2016 X-rays of the right knee were obtained in 3 projections standing. There is evidence of decreased medial joint space. No ectopic calcification. Some mild degenerative changes at the patellofemoral joint without a tilt.    PMFS History: Patient Active Problem List   Diagnosis Date Noted  . Biceps strain 06/03/2016  . Abdominal bloating 08/13/2015  . Poison ivy dermatitis 01/18/2015  . Discolored nails 01/05/2015  . Umbilical hernia 26/83/4196  . Tick bite of abdomen 08/13/2013  . Diastasis recti 07/21/2012  . Healthcare maintenance 07/21/2012  .  Acute bronchitis 04/04/2011  . Wheezing 04/04/2011  . Glaucoma 08/14/2009  . GERD 07/16/2009  . Blood in stool 07/16/2009  . BACK PAIN, LUMBAR 03/01/2009  . Overweight(278.02) 04/18/2008  . HEARING LOSS 03/30/2008  . Hypothyroidism 04/14/2007  . NEPHROLITHIASIS, HX OF 04/14/2007  . ALOPECIA, HX OF 04/14/2007  . TONSILLECTOMY AND ADENOIDECTOMY, HX OF 04/14/2007   Past Medical History:  Diagnosis Date  . Diastasis recti 07/21/2012   questionable umbilical hernia  . Glaucoma   . Personal history of diseases of skin and  subcutaneous tissue   . Personal history of urinary calculi   . Unspecified hypothyroidism   . Unspecified sinusitis (chronic)     Family History  Problem Relation Age of Onset  . Memory loss Mother   . Hypertension Mother   . Hyperlipidemia Mother   . Cancer Mother     breast cancer  . Cancer Father     prostate  . Diabetes Father     diet controlled  . Cleft lip Son   . Heart disease Maternal Grandmother   . Colon cancer Neg Hx     Past Surgical History:  Procedure Laterality Date  . Cataract surgery Bilateral   . TONSILLECTOMY AND ADENOIDECTOMY     Social History   Occupational History  . Civil engineer, contracting - retired Jay Holmes   Social History Main Topics  . Smoking status: Never Smoker  . Smokeless tobacco: Never Used  . Alcohol use No  . Drug use: No  . Sexual activity: Yes    Partners: Female

## 2016-08-22 ENCOUNTER — Other Ambulatory Visit (INDEPENDENT_AMBULATORY_CARE_PROVIDER_SITE_OTHER): Payer: Self-pay

## 2016-08-22 ENCOUNTER — Telehealth: Payer: Self-pay | Admitting: *Deleted

## 2016-08-22 DIAGNOSIS — M25561 Pain in right knee: Secondary | ICD-10-CM

## 2016-08-22 NOTE — Telephone Encounter (Signed)
Will schedule MRI RIGHT KNEE

## 2016-08-22 NOTE — Telephone Encounter (Signed)
Patient's wife called in this morning in regards to having some concerns about her husband's knee. He recently had a cortisone shot on Tuesday and he his knee is very swollen and he is not able to walk on it at all. Her CB# [phone]. Thank you

## 2016-08-22 NOTE — Telephone Encounter (Signed)
Sent referral 

## 2016-08-22 NOTE — Telephone Encounter (Signed)
Please advise 

## 2016-08-26 ENCOUNTER — Ambulatory Visit
Admission: RE | Admit: 2016-08-26 | Discharge: 2016-08-26 | Disposition: A | Discharge status: Home / Self Care | Payer: 59 | Source: Ambulatory Visit | Attending: Orthopaedic Surgery | Admitting: Orthopaedic Surgery

## 2016-08-26 DIAGNOSIS — M25561 Pain in right knee: Secondary | ICD-10-CM

## 2016-08-27 ENCOUNTER — Telehealth (INDEPENDENT_AMBULATORY_CARE_PROVIDER_SITE_OTHER): Payer: Self-pay | Admitting: Orthopaedic Surgery

## 2016-08-27 NOTE — Telephone Encounter (Signed)
Faxed today to [facility]

## 2016-08-27 NOTE — Telephone Encounter (Signed)
Pt called wanting to schedule surgery, stated he spoke with Dr. Durward Fortes yesterday. Please write blue sheet. Thanks so much!

## 2016-08-28 NOTE — Telephone Encounter (Signed)
BLUE SHEET SENT

## 2016-08-29 ENCOUNTER — Ambulatory Visit (INDEPENDENT_AMBULATORY_CARE_PROVIDER_SITE_OTHER): Payer: 59 | Admitting: Orthopaedic Surgery

## 2016-08-29 DIAGNOSIS — M23221 Derangement of posterior horn of medial meniscus due to old tear or injury, right knee: Secondary | ICD-10-CM | POA: Diagnosis not present

## 2016-09-04 ENCOUNTER — Encounter: Payer: Self-pay | Admitting: Orthopaedic Surgery

## 2016-09-04 DIAGNOSIS — G8918 Other acute postprocedural pain: Secondary | ICD-10-CM | POA: Diagnosis not present

## 2016-09-04 DIAGNOSIS — M94261 Chondromalacia, right knee: Secondary | ICD-10-CM | POA: Diagnosis not present

## 2016-09-04 DIAGNOSIS — M23221 Derangement of posterior horn of medial meniscus due to old tear or injury, right knee: Secondary | ICD-10-CM | POA: Diagnosis not present

## 2016-09-08 ENCOUNTER — Ambulatory Visit (INDEPENDENT_AMBULATORY_CARE_PROVIDER_SITE_OTHER): Payer: 59 | Admitting: Orthopaedic Surgery

## 2016-09-08 ENCOUNTER — Encounter (INDEPENDENT_AMBULATORY_CARE_PROVIDER_SITE_OTHER): Payer: Self-pay | Admitting: Orthopaedic Surgery

## 2016-09-08 VITALS — BP 122/83 | HR 81 | Ht 69.0 in | Wt 200.0 lb

## 2016-09-08 DIAGNOSIS — M25561 Pain in right knee: Secondary | ICD-10-CM

## 2016-09-08 DIAGNOSIS — G8929 Other chronic pain: Secondary | ICD-10-CM

## 2016-09-08 NOTE — Progress Notes (Signed)
Office Visit Note   Patient: Jay Holmes           Date of Birth: 01-02-1956           MRN: [id number] Visit Date: 09/08/2016              Requested by: [address], [place], [place] [address] PCP: Mauricio Po, FNP   Assessment & Plan: Visit Diagnoses:  1. Chronic pain of right knee    4 days status post right knee arthroscopy. Some degenerative change in the medial compartment. Complex tear of the posterior horn of the medial meniscus Plan: Activity as tolerated. Office 2 weeks  Follow-Up Instructions: Return in about 2 weeks (around 09/22/2016).   Orders:  No orders of the defined types were placed in this encounter.  No orders of the defined types were placed in this encounter.     Procedures: No procedures performed   Clinical Data: No additional findings.   Subjective: Chief Complaint  Patient presents with  . Right Knee - Routine Post Op    Jay Holmes is a 61 y o status post 4 days Right knee scope. He relates eh has had very little pain after the first 2 days. The incision sight is clean, dry and intact. DEnies fever, chills or redness.  Denies fever or chills, chest pain or calf discomfort. Walking without ambulatory aid and quite comfortable  HPI  Review of Systems   Objective: Vital Signs: BP 122/83   Pulse 81   Ht 5\' 9"  (1.753 m)   Wt 200 lb (90.7 kg)   BMI 29.53 kg/m   Physical Exam  Ortho Exam arthroscopic portals right knee healing without any problem. Minimal effusion. No calf pain. No swelling distally. Neurovascular exam intact. Walks with minimal limp. Knee was not hot or warm.    Imaging: No results found.   PMFS History: Patient Active Problem List   Diagnosis Date Noted  . Biceps strain 06/03/2016  . Abdominal bloating 08/13/2015  . Poison ivy dermatitis 01/18/2015  . Discolored nails 01/05/2015  . Umbilical hernia 96/29/5284  . Tick bite of abdomen 08/13/2013  . Diastasis recti 07/21/2012  .  Healthcare maintenance 07/21/2012  . Acute bronchitis 04/04/2011  . Wheezing 04/04/2011  . Glaucoma 08/14/2009  . GERD 07/16/2009  . Blood in stool 07/16/2009  . BACK PAIN, LUMBAR 03/01/2009  . Overweight(278.02) 04/18/2008  . HEARING LOSS 03/30/2008  . Hypothyroidism 04/14/2007  . NEPHROLITHIASIS, HX OF 04/14/2007  . ALOPECIA, HX OF 04/14/2007  . TONSILLECTOMY AND ADENOIDECTOMY, HX OF 04/14/2007   Past Medical History:  Diagnosis Date  . Diastasis recti 07/21/2012   questionable umbilical hernia  . Glaucoma   . Personal history of diseases of skin and subcutaneous tissue   . Personal history of urinary calculi   . Unspecified hypothyroidism   . Unspecified sinusitis (chronic)     Family History  Problem Relation Age of Onset  . Memory loss Mother   . Hypertension Mother   . Hyperlipidemia Mother   . Cancer Mother     breast cancer  . Cancer Father     prostate  . Diabetes Father     diet controlled  . Cleft lip Son   . Heart disease Maternal Grandmother   . Colon cancer Neg Hx     Past Surgical History:  Procedure Laterality Date  . Cataract surgery Bilateral   . TONSILLECTOMY AND ADENOIDECTOMY     Social History  Occupational History  . 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 Smoking status: Never Smoker  . Smokeless tobacco: Never Used  . Alcohol use No  . Drug use: No  . Sexual activity: Yes    Partners: Female     Garald Balding, MD   Note - This record has been created using [employer].  Chart creation errors have been sought, but may not always  have been located. Such creation errors do not reflect on  the standard of medical care.

## 2016-09-23 ENCOUNTER — Encounter (INDEPENDENT_AMBULATORY_CARE_PROVIDER_SITE_OTHER): Payer: Self-pay | Admitting: Orthopaedic Surgery

## 2016-09-23 ENCOUNTER — Ambulatory Visit (INDEPENDENT_AMBULATORY_CARE_PROVIDER_SITE_OTHER): Payer: 59 | Admitting: Orthopaedic Surgery

## 2016-09-23 VITALS — BP 141/77 | HR 82 | Ht 68.0 in | Wt 185.0 lb

## 2016-09-23 DIAGNOSIS — G8929 Other chronic pain: Secondary | ICD-10-CM

## 2016-09-23 DIAGNOSIS — M25561 Pain in right knee: Secondary | ICD-10-CM

## 2016-09-23 MED ORDER — BUPIVACAINE HCL 0.5 % IJ SOLN
3.0000 mL | INTRAMUSCULAR | Status: AC | PRN
  Administered 2016-09-23: 3 mL via INTRA_ARTICULAR

## 2016-09-23 MED ORDER — LIDOCAINE HCL 1 % IJ SOLN
5.0000 mL | INTRAMUSCULAR | Status: AC | PRN
  Administered 2016-09-23: 5 mL

## 2016-09-23 MED ORDER — METHYLPREDNISOLONE ACETATE 40 MG/ML IJ SUSP
80.0000 mg | INTRAMUSCULAR | Status: AC | PRN
  Administered 2016-09-23: 80 mg

## 2016-09-23 NOTE — Progress Notes (Signed)
Office Visit Note   Patient: Jay Holmes           Date of Birth: [birth year]           MRN: [id number] Visit Date: 09/23/2016              Requested by: [address], [place], [place] [address] PCP: Golden Circle, FNP   Assessment & Plan: Visit Diagnoses:  1. Chronic pain of right knee   2-1/2 weeks status post right knee arthroscopy with resection of a complex tear of the posterior horn of the medial meniscus. There are also some degenerative changes in the medial compartment. He is complaining of some tightness of his knee  Plan: Aspirate right knee of 22 mL of R and yellow fluid. Injected cortisone. Office 2 weeks  Follow-Up Instructions: Return in about 2 weeks (around 10/07/2016).   Orders:  No orders of the defined types were placed in this encounter.  No orders of the defined types were placed in this encounter.     Procedures: Large Joint Inj Date/Time: 09/23/2016 6:25 PM Performed by: Garald Balding Authorized by: Garald Balding   Consent Given by:  Patient Timeout: prior to procedure the correct patient, procedure, and site was verified   Indications:  Pain and joint swelling Location:  Knee Site:  R knee Prep: patient was prepped and draped in usual sterile fashion   Needle Size:  25 G Needle Length:  1.5 inches Approach:  Anteromedial Ultrasound Guidance: No   Fluoroscopic Guidance: No   Arthrogram: No   Medications:  5 mL lidocaine 1 %; 80 mg methylPREDNISolone acetate 40 MG/ML; 3 mL bupivacaine 0.5 % Aspiration Attempted: No   Aspirate amount (mL):  22 Aspirate:  Yellow Patient tolerance:  Patient tolerated the procedure well with no immediate complications     Clinical Data: No additional findings.   Subjective: Chief Complaint  Patient presents with  . Right Knee - Routine Post Op, Pain    [name] is a 61 y o status post 2 1/2 weeks R knee arthroscopy. He relates his R knee is a constant ache and has  trouble walking. He has trouble sleeping as the pain wakes him up.  Denies fever or chills. No calf pain. His right knee feels a little bit "tight".  HPI  Review of Systems   Objective: Vital Signs: BP (!) 141/77   Pulse 82   Ht 5\' 8"  (1.727 m)   Wt 185 lb (83.9 kg)   BMI 28.13 kg/m   Physical Exam  Ortho Exam right knee with positive effusion. No increased heat. Arthroscopic portals are healing without problem. No calf pain or swelling. Neurovascular exam intact. Full extension and 110 of flexion. Mild medial joint pain.  Specialty Comments:  No specialty comments available.  Imaging: No results found.   PMFS History: Patient Active Problem List   Diagnosis Date Noted  . Biceps strain 06/03/2016  . Abdominal bloating 08/13/2015  . Poison ivy dermatitis 01/18/2015  . Discolored nails 01/05/2015  . Umbilical hernia 82/50/5397  . Tick bite of abdomen 08/13/2013  . Diastasis recti 07/21/2012  . Healthcare maintenance 07/21/2012  . Acute bronchitis 04/04/2011  . Wheezing 04/04/2011  . Glaucoma 08/14/2009  . GERD 07/16/2009  . Blood in stool 07/16/2009  . BACK PAIN, LUMBAR 03/01/2009  . Overweight(278.02) 04/18/2008  . HEARING LOSS 03/30/2008  . Hypothyroidism 04/14/2007  . NEPHROLITHIASIS, HX OF 04/14/2007  . ALOPECIA,  HX OF 04/14/2007  . TONSILLECTOMY AND ADENOIDECTOMY, HX OF 04/14/2007   Past Medical History:  Diagnosis Date  . Diastasis recti 07/21/2012   questionable umbilical hernia  . Glaucoma   . Personal history of diseases of skin and subcutaneous tissue   . Personal history of urinary calculi   . Unspecified hypothyroidism   . Unspecified sinusitis (chronic)     Family History  Problem Relation Age of Onset  . Memory loss Mother   . Hypertension Mother   . Hyperlipidemia Mother   . Cancer Mother        breast cancer  . Cancer Father        prostate  . Diabetes Father        diet controlled  . Cleft lip Son   . Heart disease Maternal  Grandmother   . Colon cancer Neg Hx     Past Surgical History:  Procedure Laterality Date  . Cataract surgery Bilateral   . TONSILLECTOMY AND ADENOIDECTOMY     Social History   Occupational History  . 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 Smoking status: Never Smoker  . Smokeless tobacco: Never Used  . Alcohol use No  . Drug use: No  . Sexual activity: Yes    Partners: Female     Garald Balding, MD   Note - This record has been created using [employer].  Chart creation errors have been sought, but may not always  have been located. Such creation errors do not reflect on  the standard of medical care.

## 2016-09-29 ENCOUNTER — Ambulatory Visit (INDEPENDENT_AMBULATORY_CARE_PROVIDER_SITE_OTHER): Payer: 59 | Admitting: Orthopaedic Surgery

## 2016-10-02 ENCOUNTER — Other Ambulatory Visit: Payer: Self-pay | Admitting: Family

## 2016-10-07 ENCOUNTER — Ambulatory Visit (INDEPENDENT_AMBULATORY_CARE_PROVIDER_SITE_OTHER): Payer: 59 | Admitting: Orthopaedic Surgery

## 2016-10-07 ENCOUNTER — Encounter (INDEPENDENT_AMBULATORY_CARE_PROVIDER_SITE_OTHER): Payer: Self-pay | Admitting: Orthopaedic Surgery

## 2016-10-07 VITALS — BP 112/66 | HR 85 | Resp 12 | Ht 66.0 in | Wt 185.0 lb

## 2016-10-07 DIAGNOSIS — M25561 Pain in right knee: Secondary | ICD-10-CM

## 2016-10-07 DIAGNOSIS — G8929 Other chronic pain: Secondary | ICD-10-CM

## 2016-10-07 NOTE — Progress Notes (Signed)
Office Visit Note   Patient: Jay Holmes           Date of Birth: Dec 21, 1955           MRN: [id number] Visit Date: 10/07/2016              Requested by: Jay Holmes, [place], [place] [address] PCP: Jay Circle, Jay Holmes   Assessment & Plan: Visit Diagnoses:  1. Chronic pain of right knee    As post right knee arthroscopy for a with a tear of the medial meniscus and some medial compartment arthritis. He did have an effusion preoperatively and even noted on his MRI scan is having a recurrent effusion and similar without fever or chills. I believe some of that is related to the arthritis as well as the synovitis Plan: Discussed anti-inflammatory regimen, physical therapy, office 3 weeks  Follow-Up Instructions: Return in about 3 weeks (around 10/28/2016).   Orders:  Orders Placed This Encounter  Procedures  . Ambulatory referral to Physical Therapy   No orders of the defined types were placed in this encounter.     Procedures: No procedures performed   Clinical Data: No additional findings.   Subjective: Chief Complaint  Patient presents with  . Right Knee - Routine Post Op, Edema, Pain    Jay Holmes is 5 weeks status post right knee arthroscopy. Walking or standing long period hurt the knee. He relates he is having trouble sleeping at night with pain before the arthroscopy.  Cortisone injection in her right knee helped for "a short time when he was here last office visit he is having recurrent effusion. On occasion he notes that his knee feels tight. It's more uncomfortable first getting up from a sitting position it seems to feel better as he walks further. Denies fever or chills. His knee has not been particularly warm  HPI  Review of Systems   Objective: Vital Signs: BP 112/66   Pulse 85   Resp 12   Ht 5\' 6"  (1.676 m)   Wt 185 lb (83.9 kg)   BMI 29.86 kg/m   Physical Exam  Ortho Exam right knee with a small effusion. Appears less  than it did when he was here last week. Knee is cool. Mild medial joint pain. No instability. Full extension and overall 105 of flexion. No popliteal pain discomfort at distal edema.  Specialty Comments:  No specialty comments available.  Imaging: No results found.   PMFS History: Patient Active Problem List   Diagnosis Date Noted  . Biceps strain 06/03/2016  . Abdominal bloating 08/13/2015  . Poison ivy dermatitis 01/18/2015  . Discolored nails 01/05/2015  . Umbilical hernia 07/62/2633  . Tick bite of abdomen 08/13/2013  . Diastasis recti 07/21/2012  . Healthcare maintenance 07/21/2012  . Acute bronchitis 04/04/2011  . Wheezing 04/04/2011  . Glaucoma 08/14/2009  . GERD 07/16/2009  . Blood in stool 07/16/2009  . BACK PAIN, LUMBAR 03/01/2009  . Overweight(278.02) 04/18/2008  . HEARING LOSS 03/30/2008  . Hypothyroidism 04/14/2007  . NEPHROLITHIASIS, HX OF 04/14/2007  . ALOPECIA, HX OF 04/14/2007  . TONSILLECTOMY AND ADENOIDECTOMY, HX OF 04/14/2007   Past Medical History:  Diagnosis Date  . Diastasis recti 07/21/2012   questionable umbilical hernia  . Glaucoma   . Personal history of diseases of skin and subcutaneous tissue   . Personal history of urinary calculi   . Unspecified hypothyroidism   . Unspecified sinusitis (chronic)  Family History  Problem Relation Age of Onset  . Memory loss Mother   . Hypertension Mother   . Hyperlipidemia Mother   . Cancer Mother        breast cancer  . Cancer Father        prostate  . Diabetes Father        diet controlled  . Cleft lip Son   . Heart disease Maternal Grandmother   . Colon cancer Neg Hx     Past Surgical History:  Procedure Laterality Date  . Cataract surgery Bilateral   . TONSILLECTOMY AND ADENOIDECTOMY     Social History   Occupational History  . 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 Smoking status: Never Smoker  . Smokeless tobacco: Never  Used  . Alcohol use No  . Drug use: No  . Sexual activity: Yes    Partners: Female     Jay Balding, MD   Note - This record has been created using [employer].  Chart creation errors have been sought, but may not always  have been located. Such creation errors do not reflect on  the standard of medical care.

## 2016-10-10 ENCOUNTER — Ambulatory Visit (INDEPENDENT_AMBULATORY_CARE_PROVIDER_SITE_OTHER): Payer: 59 | Admitting: Orthopaedic Surgery

## 2016-10-20 ENCOUNTER — Telehealth (INDEPENDENT_AMBULATORY_CARE_PROVIDER_SITE_OTHER): Payer: Self-pay

## 2016-10-20 NOTE — Telephone Encounter (Signed)
Patient would like to know if referral for physical therapy can be sent to [facility] at [address].  Stated someone had called from [facility], but he hadn't returned the call.  CB# is [phone].  Please Advise.

## 2016-10-21 ENCOUNTER — Encounter (INDEPENDENT_AMBULATORY_CARE_PROVIDER_SITE_OTHER): Payer: Self-pay | Admitting: Orthopaedic Surgery

## 2016-10-21 ENCOUNTER — Ambulatory Visit (INDEPENDENT_AMBULATORY_CARE_PROVIDER_SITE_OTHER): Payer: 59 | Admitting: Orthopaedic Surgery

## 2016-10-21 VITALS — BP 132/77 | HR 64 | Ht 70.0 in | Wt 185.0 lb

## 2016-10-21 DIAGNOSIS — M25561 Pain in right knee: Secondary | ICD-10-CM | POA: Diagnosis not present

## 2016-10-21 DIAGNOSIS — G8929 Other chronic pain: Secondary | ICD-10-CM | POA: Diagnosis not present

## 2016-10-21 MED ORDER — METHYLPREDNISOLONE 4 MG PO TBPK: ORAL_TABLET | ORAL | 0 refills | Status: DC

## 2016-10-21 NOTE — Telephone Encounter (Signed)
Sent on Monday

## 2016-10-21 NOTE — Progress Notes (Signed)
Office Visit Note   Patient: Jay Holmes           Date of Birth: 06/09/1955           MRN: [id number] Visit Date: 10/21/2016              Requested by: [address], [place], [place] [address] PCP: Golden Circle, FNP   Assessment & Plan: Visit Diagnoses:  1. Chronic pain of right knee   Nearly 7 weeks status post right knee arthroscopy. 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 Persistent effusion postop that was also present preop  Plan: Prednisone Dosepak, spider brace. Office 10 days for consideration of another cortisone injection Follow-Up Instructions: Return in about 10 days (around 10/31/2016).   Orders:  No orders of the defined types were placed in this encounter.  Meds ordered this encounter  Medications  . methylPREDNISolone (MEDROL DOSEPAK) 4 MG TBPK tablet    Sig: Take as directed per package instructions    Dispense:  21 tablet    Refill:  0      Procedures: No procedures performed   Clinical Data: No additional findings.   Subjective: Chief Complaint  Patient presents with  . Right Knee - Pain, Edema, Routine Post Op    Jay Holmes is a 61 y o status post 7 weeks Right knee arthroscopy. He relates  Still has pain, edema and cannot walk more that 75 yds without pain. He used ibuprofen 2 weeks, 200mg  x tid.  Mr. height has had persistent effusion of his right knee postoperatively. He actually had a large effusion preoperatively. 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 He's had 1 cortisone injection postop and continues to have achiness and soreness when his knee is swollen. Actually having a little less pain but is concerned about his functional ability to walk any distance. Denies any fever chills or calf pain.  HPI  Review of  Systems   Objective: Vital Signs: BP 132/77   Pulse 64   Ht 5\' 10"  (1.778 m)   Wt 185 lb (83.9 kg)   BMI 26.54 kg/m   Physical Exam  Ortho Exam right knee exam with small effusion. Puncture sites of healed nicely. Knee was not particularly warm. No particular at tenderness in the posterior horn of the median S meniscus but did have some tenderness along the anterior third of the medial compartment. No pain laterally. No patella pain. Full extension. Flexed about 105 without instability. Neurovascular exam intact popliteal masses.  Specialty Comments:  No specialty comments available.  Imaging: No results found.   PMFS History: Patient Active Problem List   Diagnosis Date Noted  . Biceps strain 06/03/2016  . Abdominal bloating 08/13/2015  . Poison ivy dermatitis 01/18/2015  . Discolored nails 01/05/2015  . Umbilical hernia 56/81/2751  . Tick bite of abdomen 08/13/2013  . Diastasis recti 07/21/2012  . Healthcare maintenance 07/21/2012  . Acute bronchitis 04/04/2011  . Wheezing 04/04/2011  . Glaucoma 08/14/2009  . GERD 07/16/2009  . Blood in stool 07/16/2009  . BACK PAIN, LUMBAR 03/01/2009  . Overweight(278.02) 04/18/2008  . HEARING LOSS 03/30/2008  . Hypothyroidism 04/14/2007  . NEPHROLITHIASIS, HX OF 04/14/2007  . ALOPECIA, HX OF 04/14/2007  . TONSILLECTOMY AND ADENOIDECTOMY, HX OF 04/14/2007   Past  Medical History:  Diagnosis Date  . Diastasis recti 07/21/2012   questionable umbilical hernia  . Glaucoma   . Personal history of diseases of skin and subcutaneous tissue   . Personal history of urinary calculi   . Unspecified hypothyroidism   . Unspecified sinusitis (chronic)     Family History  Problem Relation Age of Onset  . Memory loss Mother   . Hypertension Mother   . Hyperlipidemia Mother   . Cancer Mother        breast cancer  . Cancer Father        prostate  . Diabetes Father        diet controlled  . Cleft lip Son   . Heart disease Maternal  Grandmother   . Colon cancer Neg Hx     Past Surgical History:  Procedure Laterality Date  . Cataract surgery Bilateral   . TONSILLECTOMY AND ADENOIDECTOMY     Social History   Occupational History  . 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 Smoking status: Never Smoker  . Smokeless tobacco: Never Used  . Alcohol use No  . Drug use: No  . Sexual activity: Yes    Partners: Female     Garald Balding, MD   Note - This record has been created using [employer].  Chart creation errors have been sought, but may not always  have been located. Such creation errors do not reflect on  the standard of medical care.

## 2016-10-27 ENCOUNTER — Ambulatory Visit (INDEPENDENT_AMBULATORY_CARE_PROVIDER_SITE_OTHER): Payer: 59 | Admitting: Orthopaedic Surgery

## 2016-10-27 VITALS — BP 117/71 | HR 80 | Ht 68.0 in | Wt 185.0 lb

## 2016-10-27 DIAGNOSIS — M25561 Pain in right knee: Secondary | ICD-10-CM | POA: Diagnosis not present

## 2016-10-27 DIAGNOSIS — G8929 Other chronic pain: Secondary | ICD-10-CM | POA: Diagnosis not present

## 2016-10-27 MED ORDER — METHYLPREDNISOLONE ACETATE 40 MG/ML IJ SUSP
80.0000 mg | INTRAMUSCULAR | Status: AC | PRN
  Administered 2016-10-27: 80 mg

## 2016-10-27 MED ORDER — BUPIVACAINE HCL 0.5 % IJ SOLN
3.0000 mL | INTRAMUSCULAR | Status: AC | PRN
  Administered 2016-10-27: 3 mL via INTRA_ARTICULAR

## 2016-10-27 MED ORDER — LIDOCAINE HCL 1 % IJ SOLN
5.0000 mL | INTRAMUSCULAR | Status: AC | PRN
  Administered 2016-10-27: 5 mL

## 2016-10-27 NOTE — Progress Notes (Signed)
Office Visit Note   Patient: Jay Holmes           Date of Birth: December 25, 1955           MRN: [id number] Visit Date: 10/27/2016              Requested by: [address], [place], [place] [address] PCP: Golden Circle, FNP   Assessment & Plan: Visit Diagnoses:  1. Chronic pain of right knee   Persistent effusion and pain post arthroscopy 8 weeks. Discussed this with Mr. and Jay Holmes and believe that the discomfort is related to healing of the meniscus resection, grade 3 chondromalacia of the medial compartment and the pre-existing knee effusion  Plan: Injected right knee with cortisone in anticipation of trip to [place]. Previously applied spider brace is too tight so we will ordering large for him and check him back officially in several weeks. Will call when the brace comes to the office  Follow-Up Instructions: Return in about 1 month (around 11/26/2016).   Orders:  No orders of the defined types were placed in this encounter.  No orders of the defined types were placed in this encounter.     Procedures: Large Joint Inj Date/Time: 10/27/2016 3:30 PM Performed by: Garald Balding Authorized by: Garald Balding   Consent Given by:  Patient Timeout: prior to procedure the correct patient, procedure, and site was verified   Indications:  Pain and joint swelling Location:  Knee Site:  R knee Prep: patient was prepped and draped in usual sterile fashion   Needle Size:  25 G Needle Length:  1.5 inches Approach:  Anteromedial Ultrasound Guidance: No   Fluoroscopic Guidance: No   Arthrogram: No   Medications:  5 mL lidocaine 1 %; 80 mg methylPREDNISolone acetate 40 MG/ML; 3 mL bupivacaine 0.5 % Aspiration Attempted: No   Patient tolerance:  Patient tolerated the procedure well with no immediate complications     Clinical Data: No additional findings.   Subjective: No chief complaint on file.   HPI  Review of  Systems   Objective: Vital Signs: BP 117/71   Pulse 80   Ht 5\' 8"  (1.727 m)   Wt 185 lb (83.9 kg)   BMI 28.13 kg/m   Physical Exam  Ortho Exam  Specialty Comments:  No specialty comments available.  Imaging: No results found.   PMFS History: Patient Active Problem List   Diagnosis Date Noted  . Biceps strain 06/03/2016  . Abdominal bloating 08/13/2015  . Poison ivy dermatitis 01/18/2015  . Discolored nails 01/05/2015  . Umbilical hernia 16/96/7893  . Tick bite of abdomen 08/13/2013  . Diastasis recti 07/21/2012  . Healthcare maintenance 07/21/2012  . Acute bronchitis 04/04/2011  . Wheezing 04/04/2011  . Glaucoma 08/14/2009  . GERD 07/16/2009  . Blood in stool 07/16/2009  . BACK PAIN, LUMBAR 03/01/2009  . Overweight(278.02) 04/18/2008  . HEARING LOSS 03/30/2008  . Hypothyroidism 04/14/2007  . NEPHROLITHIASIS, HX OF 04/14/2007  . ALOPECIA, HX OF 04/14/2007  . TONSILLECTOMY AND ADENOIDECTOMY, HX OF 04/14/2007   Past Medical History:  Diagnosis Date  . Diastasis recti 07/21/2012   questionable umbilical hernia  . Glaucoma   . Personal history of diseases of skin and subcutaneous tissue   . Personal history of urinary calculi   . Unspecified hypothyroidism   . Unspecified sinusitis (chronic)     Family History  Problem Relation Age of Onset  . Memory loss Mother   .  Hypertension Mother   . Hyperlipidemia Mother   . Cancer Mother        breast cancer  . Cancer Father        prostate  . Diabetes Father        diet controlled  . Cleft lip Son   . Heart disease Maternal Grandmother   . Colon cancer Neg Hx     Past Surgical History:  Procedure Laterality Date  . Cataract surgery Bilateral   . TONSILLECTOMY AND ADENOIDECTOMY     Social History   Occupational History  . 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 Smoking status: Never Smoker  . Smokeless tobacco: Never Used  . Alcohol use No  .  Drug use: No  . Sexual activity: Yes    Partners: Female     Garald Balding, MD   Note - This record has been created using [employer].  Chart creation errors have been sought, but may not always  have been located. Such creation errors do not reflect on  the standard of medical care.

## 2016-10-28 ENCOUNTER — Other Ambulatory Visit: Payer: Self-pay | Admitting: Family

## 2016-10-28 ENCOUNTER — Telehealth: Payer: Self-pay | Admitting: Family

## 2016-10-28 MED ORDER — OMEPRAZOLE 40 MG PO CPDR: 40.0000 mg | DELAYED_RELEASE_CAPSULE | Freq: Every day | ORAL | 0 refills | Status: DC

## 2016-10-28 NOTE — Telephone Encounter (Signed)
Please resend omeprazole (PRILOSEC) 40 MG capsule To [facility] in [place]   They are going on vacation and [facility] cannot get his medication to him before they go on vacation

## 2016-10-28 NOTE — Telephone Encounter (Signed)
Rx has been resent to [facility].

## 2016-10-29 ENCOUNTER — Ambulatory Visit: Payer: 59 | Attending: Orthopaedic Surgery | Admitting: Physical Therapy

## 2016-10-29 ENCOUNTER — Encounter: Payer: Self-pay | Admitting: Physical Therapy

## 2016-10-29 DIAGNOSIS — M25561 Pain in right knee: Secondary | ICD-10-CM | POA: Diagnosis not present

## 2016-10-29 DIAGNOSIS — M6281 Muscle weakness (generalized): Secondary | ICD-10-CM | POA: Diagnosis present

## 2016-10-29 DIAGNOSIS — M25661 Stiffness of right knee, not elsewhere classified: Secondary | ICD-10-CM | POA: Diagnosis not present

## 2016-10-29 DIAGNOSIS — R262 Difficulty in walking, not elsewhere classified: Secondary | ICD-10-CM | POA: Diagnosis present

## 2016-10-29 DIAGNOSIS — R600 Localized edema: Secondary | ICD-10-CM | POA: Diagnosis not present

## 2016-10-29 NOTE — Therapy (Addendum)
[place], [place], [address] Phone: [phone]   Fax:  [phone]  Physical Therapy Evaluation/discharge Summary  Patient Details  Name: Jay Holmes MRN: [id number] Date of Birth: 1955-10-13 Referring Provider: Garald Balding, MD  Encounter Date: 10/29/2016      PT End of Session - 10/29/16 0831    Visit Number 1   Number of Visits 9   Date for PT Re-Evaluation 12/05/16   Authorization Type [employer]- 60 visit limit   PT Start Time 0832   PT Stop Time 0919   PT Time Calculation (min) 47 min   Activity Tolerance Patient tolerated treatment well   Behavior During Therapy  [facility] for tasks assessed/performed      Past Medical History:  Diagnosis Date  . Diastasis recti 07/21/2012   questionable umbilical hernia  . Glaucoma   . Personal history of diseases of skin and subcutaneous tissue   . Personal history of urinary calculi   . Unspecified hypothyroidism   . Unspecified sinusitis (chronic)     Past Surgical History:  Procedure Laterality Date  . Cataract surgery Bilateral   . TONSILLECTOMY AND ADENOIDECTOMY      There were no vitals filed for this visit.       Subjective Assessment - 10/29/16 0833    Subjective Meniscus sugery on 09/04/16. Cortisone injection on Monday,this is the second one. A little less pain since injection. Prior to injection was still painful to walk 2 blocks around baseball field. Most pain notable at medial knee, like two bones scraping together. going to [place] on 6/27.    How long can you walk comfortably? <2 blocks   Patient Stated Goals walk, standing-weight bearing   Currently in Pain? No/denies   Pain Location Knee   Pain Orientation Right   Aggravating Factors  walking, standing   Pain Relieving Factors sit            [facility] PT Assessment - 10/29/16 0001      Assessment   Medical Diagnosis post op arthroscopic R knee   Referring Provider Garald Balding, MD   Onset Date/Surgical Date 09/04/16     Precautions   Precautions None     Restrictions   Weight Bearing Restrictions No     Balance Screen   Has the patient fallen in the past 6 months No     [place] residence   Living Arrangements Spouse/significant other;Children   Additional Comments stairs at home     Prior Function   Level of Independence Independent     Cognition   Overall Cognitive Status Within Functional Limits for tasks assessed     Observation/Other Assessments   Focus on Therapeutic Outcomes (FOTO)  50% limitation (goal 34%)     Observation/Other Assessments-Edema    Edema --  notable edema in R knee vs L     Sensation   Additional Comments occasional electrical jolt down in lower leg     ROM / Strength   AROM / PROM / Strength Strength     Strength   Strength Assessment Site Hip   Right/Left Hip Right   Right Hip ABduction 3+/5     Palpation   Patella mobility good mobility, increased due to edema   Palpation comment denies TTP     Ambulation/Gait   Gait Comments limited DF at heel strike            Objective  measurements completed on examination: See above findings.          [facility] PT Treatment/Exercise - 10/29/16 0001      Exercises   Exercises Knee/Hip     Knee/Hip Exercises: Stretches   Active Hamstring Stretch Limitations supine active stretch with DF   Passive Hamstring Stretch Limitations supine and seated    Gastroc Stretch Limitations at wall     Knee/Hip Exercises: Sidelying   Clams red tband     Modalities   Modalities Cryotherapy     Cryotherapy   Number Minutes Cryotherapy 10 Minutes  concurrent with education   Cryotherapy Location Knee   Type of Cryotherapy Ice pack                PT Education - 10/29/16 0923    Education provided Yes   Education Details anatomy of condition, POC, HEP, exercise form/rationale, shoe wear, assistance of  vitamins such as chondroitin, arthritis and edema, importance of ice for edema   Person(s) Educated Patient   Methods Explanation;Demonstration;Tactile cues;Verbal cues;Handout   Comprehension Verbalized understanding;Returned demonstration;Verbal cues required;Tactile cues required;Need further instruction          PT Short Term Goals - 10/29/16 0925      PT SHORT TERM GOAL #1   Title Pt will be able to lay supine comfortably with leg extended by 7/4   Baseline limited by hamstring tightness causing discomfort at eval   Time 2   Period Weeks   Status New           PT Long Term Goals - 10/29/16 1470      PT LONG TERM GOAL #1   Title hip abduction 5/5 MMT to indicate proper support from proximal musculature by 7/27   Baseline 3+/5 at eval   Time 5   Period Weeks   Status New     PT LONG TERM GOAL #2   Title Pt will be able to walk for at least 1 hour without limitaiton by knee joint    Baseline unable to walk 2 blocks, limited by pain   Time 5   Period Weeks   Status New     PT LONG TERM GOAL #3   Title Pt will be independent with HEP exercises in order to progress into long term exercise routine   Baseline began establishing at eval and will progress as appropraite   Time 5   Period Weeks   Status New     PT LONG TERM GOAL #4   Title FOTO to 34% limitation to indicate significant improvement in functional ability   Baseline 50% limited at eval   Time 5   Period Weeks   Status New                Plan - 10/29/16 9295    Clinical Impression Statement Pt presents to PT with complaints of R knee pain and limited ambulation ability s/p arthroscopy 8 weeks ago. Pt with notable tightness in hamstrings and weakness in hip abductors leading to poor biomechanical chain control at knee joint. Pt will benefit from skilled PT in order to improve strength, flexibility and decrease pain to meet long term goals.    History and Personal Factors relevant to plan of care:  none   Clinical Presentation Stable   Clinical Presentation due to: n/a   Clinical Decision Making Low   Rehab Potential Good   PT Frequency 2x / week   PT Duration --  4 weeks (1 week vacation in the middle)   PT Treatment/Interventions ADLs/Self Care Home Management;Cryotherapy;Electrical Stimulation;Iontophoresis 67m/ml Dexamethasone;Functional mobility training;Stair training;Gait training;Ultrasound;Moist Heat;Therapeutic activities;Therapeutic exercise;Balance training;Neuromuscular re-education;Patient/family education;Passive range of motion;Scar mobilization;Manual techniques;Dry needling;Taping   PT Next Visit Plan hip abduction strengthening, core activation   PT Home Exercise Plan hamstring stretch, clam, gastroc stretch   Consulted and Agree with Plan of Care Patient      Patient will benefit from skilled therapeutic intervention in order to improve the following deficits and impairments:  Difficulty walking, Increased muscle spasms, Decreased activity tolerance, Pain, Impaired flexibility, Decreased strength, Increased edema, Improper body mechanics  Visit Diagnosis: Acute pain of right knee - Plan: PT plan of care cert/re-cert  Localized edema - Plan: PT plan of care cert/re-cert  Stiffness of right knee, not elsewhere classified - Plan: PT plan of care cert/re-cert  Difficulty in walking, not elsewhere classified - Plan: PT plan of care cert/re-cert  Muscle weakness (generalized) - Plan: PT plan of care cert/re-cert     Problem List Patient Active Problem List   Diagnosis Date Noted  . Biceps strain 06/03/2016  . Abdominal bloating 08/13/2015  . Poison ivy dermatitis 01/18/2015  . Discolored nails 01/05/2015  . Umbilical hernia 184/13/2440 . Tick bite of abdomen 08/13/2013  . Diastasis recti 07/21/2012  . Healthcare maintenance 07/21/2012  . Acute bronchitis 04/04/2011  . Wheezing 04/04/2011  . Glaucoma 08/14/2009  . GERD 07/16/2009  . Blood in stool  07/16/2009  . BACK PAIN, LUMBAR 03/01/2009  . Overweight(278.02) 04/18/2008  . HEARING LOSS 03/30/2008  . Hypothyroidism 04/14/2007  . NEPHROLITHIASIS, HX OF 04/14/2007  . ALOPECIA, HX OF 04/14/2007  . TONSILLECTOMY AND ADENOIDECTOMY, HX OF 04/14/2007    Kaitrin Seybold C. Cortina Vultaggio PT, DPT 10/29/16 10:29 AM   [facility]GMess[address] College N[place] [address]Phone: [phone]  Fax:  [phone] Name: DESIQUIO BOESENMRN: [id number] of Birth: 4September 20, 1957  PHYSICAL THERAPY DISCHARGE SUMMARY  Visits from Start of Care: 1  Current functional level related to goals / functional outcomes: See above   Remaining deficits: See above   Education / Equipment: Anatomy of condition, POC, HEP, exercise form/ratioanle  Plan: Patient agrees to discharge.  Patient goals were not met. Patient is being discharged due to the patient's request.  ?????    Namrata Dangler C. Thong Feeny PT, DPT 12/18/16 6:22 PM

## 2016-11-04 ENCOUNTER — Telehealth (INDEPENDENT_AMBULATORY_CARE_PROVIDER_SITE_OTHER): Payer: Self-pay | Admitting: Orthopaedic Surgery

## 2016-11-04 NOTE — Telephone Encounter (Signed)
Patient called stating that he was suppose to be getting a knee brace along with the cover.  He is wanting to get this before he goes on vacation.  CB#[phone].  Thank you.

## 2016-11-06 NOTE — Telephone Encounter (Signed)
LVMOM that his knee brace will be here on 11/07/16 but call first to make sure

## 2016-11-10 ENCOUNTER — Telehealth: Payer: Self-pay

## 2016-11-10 NOTE — Telephone Encounter (Signed)
VMOM for pt to let him know the Large spider brace is here.  (on my desk with his name)

## 2016-11-17 ENCOUNTER — Telehealth (INDEPENDENT_AMBULATORY_CARE_PROVIDER_SITE_OTHER): Payer: Self-pay | Admitting: Orthopaedic Surgery

## 2016-11-17 ENCOUNTER — Other Ambulatory Visit (INDEPENDENT_AMBULATORY_CARE_PROVIDER_SITE_OTHER): Payer: Self-pay

## 2016-11-17 DIAGNOSIS — M25561 Pain in right knee: Principal | ICD-10-CM

## 2016-11-17 DIAGNOSIS — G8929 Other chronic pain: Secondary | ICD-10-CM

## 2016-11-17 NOTE — Telephone Encounter (Signed)
Please schedule MRI of that knee with persistent pain and swelling post op (3 mos)

## 2016-11-17 NOTE — Telephone Encounter (Signed)
Patient still having a lot of pain with knee. Patient needs to pick up brace still, but was wondering if Dr. would go ahead, and order MRI for patients knee. Please call patient to discuss.

## 2016-11-17 NOTE — Telephone Encounter (Signed)
Please advise 

## 2016-11-17 NOTE — Telephone Encounter (Signed)
done

## 2016-11-18 ENCOUNTER — Ambulatory Visit: Payer: 59 | Admitting: Physical Therapy

## 2016-11-21 ENCOUNTER — Encounter: Payer: 59 | Admitting: Physical Therapy

## 2016-11-22 ENCOUNTER — Ambulatory Visit
Admission: RE | Admit: 2016-11-22 | Discharge: 2016-11-22 | Disposition: A | Discharge status: Home / Self Care | Payer: 59 | Source: Ambulatory Visit | Attending: Orthopaedic Surgery | Admitting: Orthopaedic Surgery

## 2016-11-22 DIAGNOSIS — M7989 Other specified soft tissue disorders: Secondary | ICD-10-CM | POA: Diagnosis not present

## 2016-11-22 DIAGNOSIS — M25561 Pain in right knee: Principal | ICD-10-CM

## 2016-11-22 DIAGNOSIS — G8929 Other chronic pain: Secondary | ICD-10-CM

## 2016-11-24 ENCOUNTER — Telehealth (INDEPENDENT_AMBULATORY_CARE_PROVIDER_SITE_OTHER): Payer: Self-pay | Admitting: Orthopaedic Surgery

## 2016-11-24 ENCOUNTER — Encounter: Payer: 59 | Admitting: Physical Therapy

## 2016-11-24 NOTE — Telephone Encounter (Signed)
Patient wants to know if you all have the results of his MRI.

## 2016-11-25 NOTE — Telephone Encounter (Signed)
called

## 2016-11-25 NOTE — Telephone Encounter (Signed)
Please advise 

## 2016-11-26 ENCOUNTER — Encounter: Payer: 59 | Admitting: Physical Therapy

## 2016-11-28 ENCOUNTER — Ambulatory Visit (INDEPENDENT_AMBULATORY_CARE_PROVIDER_SITE_OTHER): Payer: 59 | Admitting: Orthopaedic Surgery

## 2016-12-01 ENCOUNTER — Encounter: Payer: 59 | Admitting: Physical Therapy

## 2016-12-02 ENCOUNTER — Other Ambulatory Visit: Payer: Self-pay

## 2016-12-02 MED ORDER — OMEPRAZOLE 40 MG PO CPDR: 40.0000 mg | DELAYED_RELEASE_CAPSULE | Freq: Every day | ORAL | 0 refills | Status: DC

## 2016-12-04 ENCOUNTER — Encounter: Payer: 59 | Admitting: Physical Therapy

## 2016-12-11 DIAGNOSIS — Z961 Presence of intraocular lens: Secondary | ICD-10-CM | POA: Diagnosis not present

## 2016-12-11 DIAGNOSIS — H401132 Primary open-angle glaucoma, bilateral, moderate stage: Secondary | ICD-10-CM | POA: Diagnosis not present

## 2016-12-25 ENCOUNTER — Telehealth (INDEPENDENT_AMBULATORY_CARE_PROVIDER_SITE_OTHER): Payer: Self-pay | Admitting: Orthopaedic Surgery

## 2016-12-25 NOTE — Telephone Encounter (Signed)
Please make an appt witrh Dr Ninfa Linden to consider a partial knee replacement-all info on the chart

## 2016-12-25 NOTE — Telephone Encounter (Signed)
Patient has gone thru full regimen of Ibuprofen as advised by doctor with no change. Patient would like to know next step. Please call to advise.

## 2016-12-25 NOTE — Telephone Encounter (Signed)
What to do??

## 2016-12-29 DIAGNOSIS — M1711 Unilateral primary osteoarthritis, right knee: Secondary | ICD-10-CM | POA: Diagnosis not present

## 2016-12-29 NOTE — Telephone Encounter (Signed)
Please schedule with Dr. Ninfa Linden as noted by [employer]

## 2017-01-13 ENCOUNTER — Other Ambulatory Visit: Payer: Self-pay | Admitting: Family

## 2017-02-04 DIAGNOSIS — C4441 Basal cell carcinoma of skin of scalp and neck: Secondary | ICD-10-CM | POA: Diagnosis not present

## 2017-02-04 DIAGNOSIS — L720 Epidermal cyst: Secondary | ICD-10-CM | POA: Diagnosis not present

## 2017-02-04 DIAGNOSIS — L723 Sebaceous cyst: Secondary | ICD-10-CM | POA: Diagnosis not present

## 2017-02-04 DIAGNOSIS — L570 Actinic keratosis: Secondary | ICD-10-CM | POA: Diagnosis not present

## 2017-02-11 DIAGNOSIS — C4441 Basal cell carcinoma of skin of scalp and neck: Secondary | ICD-10-CM | POA: Diagnosis not present

## 2017-02-18 ENCOUNTER — Telehealth: Payer: Self-pay

## 2017-02-18 ENCOUNTER — Ambulatory Visit (INDEPENDENT_AMBULATORY_CARE_PROVIDER_SITE_OTHER): Payer: 59

## 2017-02-18 DIAGNOSIS — Z23 Encounter for immunization: Secondary | ICD-10-CM

## 2017-02-18 NOTE — Telephone Encounter (Signed)
Patient is requesting Dr Ronnald Ramp to be his new physician---since greg is leaving---routing to dr Ronnald Ramp, please advise, I will call patient back, thanks

## 2017-02-22 NOTE — Telephone Encounter (Signed)
Yes I will see him

## 2017-02-23 ENCOUNTER — Other Ambulatory Visit: Payer: Self-pay | Admitting: Family

## 2017-02-23 NOTE — Telephone Encounter (Signed)
Sent mychart message to patient, ok for patient to establish care with dr Ronnald Ramp, also advised patient on phone---patient already made appt with Caryl Pina that I have now switched over to dr jones---new appt with dr Ronnald Ramp is 11/26 at 9:30---I have advised patient of the appointment change

## 2017-03-09 DIAGNOSIS — M1711 Unilateral primary osteoarthritis, right knee: Secondary | ICD-10-CM | POA: Diagnosis not present

## 2017-03-16 DIAGNOSIS — M1711 Unilateral primary osteoarthritis, right knee: Secondary | ICD-10-CM | POA: Diagnosis not present

## 2017-03-23 DIAGNOSIS — M1711 Unilateral primary osteoarthritis, right knee: Secondary | ICD-10-CM | POA: Diagnosis not present

## 2017-03-26 ENCOUNTER — Other Ambulatory Visit: Payer: Self-pay | Admitting: Internal Medicine

## 2017-03-27 ENCOUNTER — Other Ambulatory Visit: Payer: Self-pay | Admitting: Internal Medicine

## 2017-04-01 ENCOUNTER — Ambulatory Visit: Payer: 59 | Admitting: Nurse Practitioner

## 2017-04-01 ENCOUNTER — Other Ambulatory Visit: Payer: Self-pay | Admitting: Internal Medicine

## 2017-04-01 MED ORDER — LEVOTHYROXINE SODIUM 100 MCG PO TABS: 100.0000 ug | ORAL_TABLET | Freq: Every day | ORAL | 0 refills | Status: DC

## 2017-04-01 NOTE — Telephone Encounter (Signed)
Patient would like Jay Holmes at [employer].

## 2017-04-01 NOTE — Addendum Note (Signed)
Addended by: Karle Barr on: 04/01/2017 04:18 PM   Modules accepted: Orders

## 2017-04-01 NOTE — Telephone Encounter (Signed)
erx sent

## 2017-04-01 NOTE — Telephone Encounter (Signed)
Requesting refill on levothyroxine tabs.  90 day supply.  Patient set to transfer on 11/26.

## 2017-04-01 NOTE — Telephone Encounter (Signed)
Can you let pt know that I am not able to send in a 90 day and will need to know what local pharmacy to send a 30 day supply to.

## 2017-04-01 NOTE — Addendum Note (Signed)
Addended by: Aviva Signs M on: 04/01/2017 10:50 AM   Modules accepted: Orders

## 2017-04-06 ENCOUNTER — Other Ambulatory Visit (INDEPENDENT_AMBULATORY_CARE_PROVIDER_SITE_OTHER): Payer: 59

## 2017-04-06 ENCOUNTER — Ambulatory Visit: Payer: 59 | Admitting: Internal Medicine

## 2017-04-06 ENCOUNTER — Encounter: Payer: Self-pay | Admitting: Internal Medicine

## 2017-04-06 VITALS — BP 130/88 | HR 82 | Temp 97.9°F | Ht 68.0 in | Wt 214.0 lb

## 2017-04-06 DIAGNOSIS — R7303 Prediabetes: Secondary | ICD-10-CM

## 2017-04-06 DIAGNOSIS — E669 Obesity, unspecified: Secondary | ICD-10-CM

## 2017-04-06 DIAGNOSIS — Z0000 Encounter for general adult medical examination without abnormal findings: Secondary | ICD-10-CM | POA: Diagnosis not present

## 2017-04-06 DIAGNOSIS — E039 Hypothyroidism, unspecified: Secondary | ICD-10-CM | POA: Diagnosis not present

## 2017-04-06 DIAGNOSIS — Z0001 Encounter for general adult medical examination with abnormal findings: Secondary | ICD-10-CM

## 2017-04-06 DIAGNOSIS — R600 Localized edema: Secondary | ICD-10-CM | POA: Diagnosis not present

## 2017-04-06 LAB — URINALYSIS, ROUTINE W REFLEX MICROSCOPIC
BILIRUBIN URINE: NEGATIVE
Hgb urine dipstick: NEGATIVE
Ketones, ur: NEGATIVE
Leukocytes, UA: NEGATIVE
Nitrite: NEGATIVE
PH: 5.5 (ref 5.0–8.0)
RBC / HPF: NONE SEEN (ref 0–?)
SPECIFIC GRAVITY, URINE: 1.025 (ref 1.000–1.030)
TOTAL PROTEIN, URINE-UPE24: NEGATIVE
Urine Glucose: NEGATIVE
Urobilinogen, UA: 0.2 (ref 0.0–1.0)
WBC UA: NONE SEEN (ref 0–?)

## 2017-04-06 LAB — COMPREHENSIVE METABOLIC PANEL
ALK PHOS: 97 U/L (ref 39–117)
ALT: 37 U/L (ref 0–53)
AST: 23 U/L (ref 0–37)
Albumin: 4.4 g/dL (ref 3.5–5.2)
BILIRUBIN TOTAL: 0.8 mg/dL (ref 0.2–1.2)
BUN: 20 mg/dL (ref 6–23)
CALCIUM: 9.4 mg/dL (ref 8.4–10.5)
CO2: 28 mEq/L (ref 19–32)
CREATININE: 1.04 mg/dL (ref 0.40–1.50)
Chloride: 104 mEq/L (ref 96–112)
GFR: 76.76 mL/min (ref >60.00)
GLUCOSE: 98 mg/dL (ref 70–99)
Potassium: 4.3 mEq/L (ref 3.5–5.1)
Sodium: 141 mEq/L (ref 135–145)
TOTAL PROTEIN: 7.2 g/dL (ref 6.0–8.3)

## 2017-04-06 LAB — CBC WITH DIFFERENTIAL/PLATELET
BASOS ABS: 0.1 10*3/uL (ref 0.0–0.1)
Basophils Relative: 1.1 % (ref 0.0–3.0)
EOS ABS: 0.2 10*3/uL (ref 0.0–0.7)
Eosinophils Relative: 3.7 % (ref 0.0–5.0)
HEMATOCRIT: 47.8 % (ref 39.0–52.0)
HEMOGLOBIN: 16.2 g/dL (ref 13.0–17.0)
LYMPHS PCT: 35.5 % (ref 12.0–46.0)
Lymphs Abs: 2 10*3/uL (ref 0.7–4.0)
MCHC: 33.8 g/dL (ref 30.0–36.0)
MCV: 94.8 fl (ref 78.0–100.0)
MONOS PCT: 10.7 % (ref 3.0–12.0)
Monocytes Absolute: 0.6 10*3/uL (ref 0.1–1.0)
Neutro Abs: 2.8 10*3/uL (ref 1.4–7.7)
Neutrophils Relative %: 49 % (ref 43.0–77.0)
Platelets: 224 10*3/uL (ref 150.0–400.0)
RBC: 5.05 Mil/uL (ref 4.22–5.81)
RDW: 13.5 % (ref 11.5–15.5)
WBC: 5.6 10*3/uL (ref 4.0–10.5)

## 2017-04-06 LAB — LIPID PANEL
CHOL/HDL RATIO: 4
Cholesterol: 147 mg/dL (ref 0–200)
HDL: 37.1 mg/dL — ABNORMAL LOW (ref >39.00)
LDL Cholesterol: 86 mg/dL (ref 0–99)
NONHDL: 109.88
TRIGLYCERIDES: 120 mg/dL (ref 0.0–149.0)
VLDL: 24 mg/dL (ref 0.0–40.0)

## 2017-04-06 LAB — HEMOGLOBIN A1C: HEMOGLOBIN A1C: 5.9 % (ref 4.6–6.5)

## 2017-04-06 LAB — TSH: TSH: 2.68 u[IU]/mL (ref 0.35–4.50)

## 2017-04-06 LAB — BRAIN NATRIURETIC PEPTIDE: PRO B NATRI PEPTIDE: 17 pg/mL (ref 0.0–100.0)

## 2017-04-06 LAB — PSA: PSA: 0.27 ng/mL (ref 0.10–4.00)

## 2017-04-06 NOTE — Progress Notes (Signed)
Subjective:  Patient ID: Jay Holmes, male    DOB: [birth year]  Age: 61 y.o. MRN: [id number]  CC: Hypothyroidism and Annual Exam   HPI COREON SIMKINS presents for a CPX.  He complains of weight gain.  About 10 pounds over the last 4 months.  He is struggling with chronic knee pain and therefore does not exercise.  He is also had some lapses in his caloric intake.  He denies chest pain, shortness of breath, DOE, fatigue, or palpitations.  Outpatient Medications Prior to Visit  Medication Sig Dispense Refill  . bimatoprost (LUMIGAN) 0.03 % ophthalmic solution Place 1 drop into both eyes at bedtime.      . Brimonidine Tartrate-Timolol (COMBIGAN OP) Apply to eye.    [name] omeprazole (PRILOSEC) 40 MG capsule Take 1 capsule (40 mg total) by mouth daily. 30 capsule 0  . levothyroxine (SYNTHROID, LEVOTHROID) 100 MCG tablet Take 1 tablet (100 mcg total) by mouth daily. 30 tablet 0  . Diclofenac Sodium (PENNSAID) 2 % SOLN Place 1 application onto the skin 2 (two) times daily as needed. (Patient not taking: Reported on 10/29/2016) 112 g 1  . Glucosamine-Chondroit-Vit C-Mn (GLUCOSAMINE 1500 COMPLEX) CAPS Take 1 capsule by mouth daily.      . Ibuprofen-Famotidine 800-26.6 MG TABS Take 1 tablet by mouth 3 (three) times daily as needed. (Patient not taking: Reported on 10/29/2016) 90 tablet 1  . ketoconazole (NIZORAL) 2 % cream Apply 1 application topically daily.    [name] latanoprost (XALATAN) 0.005 % ophthalmic solution 1 drop at bedtime.    . LOTEMAX 0.5 % ophthalmic suspension Place 1 drop into both eyes 2 (two) times daily.  1  . LUMIGAN 0.01 % SOLN     . methylPREDNISolone (MEDROL DOSEPAK) 4 MG TBPK tablet Take as directed per package instructions (Patient not taking: Reported on 10/29/2016) 21 tablet 0  . Omega 3 1200 MG CAPS Take by mouth.    [name] omeprazole (PRILOSEC) 40 MG capsule Take 1 capsule (40 mg total) by mouth daily. 90 capsule 0  . oxyCODONE-acetaminophen (PERCOCET/ROXICET) 5-325 MG tablet       No facility-administered medications prior to visit.     ROS Review of Systems  Constitutional: Positive for activity change and unexpected weight change. Negative for appetite change, chills, diaphoresis and fatigue.  HENT: Negative.   Eyes: Negative for visual disturbance.  Respiratory: Negative.  Negative for cough, chest tightness, shortness of breath and wheezing.   Cardiovascular: Negative.  Negative for chest pain, palpitations and leg swelling.  Gastrointestinal: Negative.  Negative for abdominal pain, constipation, nausea and vomiting.  Endocrine: Negative.  Negative for cold intolerance and heat intolerance.  Genitourinary: Negative.  Negative for difficulty urinating, frequency, penile swelling, scrotal swelling and testicular pain.  Musculoskeletal: Positive for arthralgias. Negative for back pain, myalgias and neck pain.  Skin: Negative.   Allergic/Immunologic: Negative.   Neurological: Negative.   Hematological: Negative for adenopathy. Does not bruise/bleed easily.  Psychiatric/Behavioral: Negative.     Objective:  BP 130/88 (BP Location: Left Arm, Patient Position: Sitting, Cuff Size: Normal)   Pulse 82   Temp 97.9 F (36.6 C) (Oral)   Ht 5\' 8"  (1.727 m)   Wt 214 lb (97.1 kg)   SpO2 98%   BMI 32.54 kg/m   BP Readings from Last 3 Encounters:  04/06/17 130/88  10/27/16 117/71  10/21/16 132/77    Wt Readings from Last 3 Encounters:  04/06/17 214 lb (97.1 kg)  10/27/16 185 lb (83.9  kg)  10/21/16 185 lb (83.9 kg)    Physical Exam  Constitutional: He is oriented to person, place, and time. No distress.  HENT:  Mouth/Throat: Oropharynx is clear and moist. No oropharyngeal exudate.  Eyes: Conjunctivae are normal. Right eye exhibits no discharge. Left eye exhibits no discharge. No scleral icterus.  Neck: Normal range of motion. Neck supple. No JVD present. No thyromegaly present.  Cardiovascular: Normal rate, regular rhythm and normal heart sounds. Exam  reveals no gallop.  No murmur heard. EKG -  Sinus  Rhythm  WITHIN NORMAL LIMITS - no change from the prior EKG  Pulmonary/Chest: Effort normal and breath sounds normal. No respiratory distress. He has no wheezes. He has no rales. He exhibits no tenderness.  Abdominal: Soft. Bowel sounds are normal. He exhibits no distension and no mass. There is no tenderness. There is no rebound and no guarding. Hernia confirmed negative in the right inguinal area and confirmed negative in the left inguinal area.  Genitourinary: Rectum normal, prostate normal, testes normal and penis normal. Rectal exam shows no external hemorrhoid, no internal hemorrhoid, no fissure, no mass, no tenderness, anal tone normal and guaiac negative stool. Prostate is not enlarged and not tender. Right testis shows no mass, no swelling and no tenderness. Right testis is descended. Left testis shows no mass, no swelling and no tenderness. Left testis is descended. Circumcised. No penile erythema or penile tenderness. No discharge found.  Musculoskeletal: Normal range of motion. He exhibits no edema, tenderness or deformity.  Lymphadenopathy:    He has no cervical adenopathy.       Right: No inguinal adenopathy present.       Left: No inguinal adenopathy present.  Neurological: He is alert and oriented to person, place, and time.  Skin: Skin is warm and dry. No rash noted. He is not diaphoretic. No erythema. No pallor.  Psychiatric: He has a normal mood and affect. His behavior is normal. Judgment and thought content normal.  Vitals reviewed.   Lab Results  Component Value Date   WBC 5.6 04/06/2017   HGB 16.2 04/06/2017   HCT 47.8 04/06/2017   PLT 224.0 04/06/2017   GLUCOSE 98 04/06/2017   CHOL 147 04/06/2017   TRIG 120.0 04/06/2017   HDL 37.10 (L) 04/06/2017   LDLCALC 86 04/06/2017   ALT 37 04/06/2017   AST 23 04/06/2017   NA 141 04/06/2017   K 4.3 04/06/2017   CL 104 04/06/2017   CREATININE 1.04 04/06/2017   BUN 20  04/06/2017   CO2 28 04/06/2017   TSH 2.68 04/06/2017   PSA 0.27 04/06/2017   HGBA1C 5.9 04/06/2017    Mr Knee Right W/o Contrast  Result Date: 11/22/2016 CLINICAL DATA:  Medial right knee pain and swelling. Status post right knee surgery 09/04/2016 for medial meniscal tear. No known injury. EXAM: MRI OF THE RIGHT KNEE WITHOUT CONTRAST TECHNIQUE: Multiplanar, multisequence MR imaging of the knee was performed. No intravenous contrast was administered. COMPARISON:  MRI right knee 08/26/2016. FINDINGS: MENISCI Medial meniscus: Since the prior MRI, the patient has undergone debridement of a tear of the posterior horn and body of the medial meniscus. Small remnant of the body of the medial meniscus is extruded peripherally out of the joint and severely degenerated. No focal tear is seen. Lateral meniscus:  Intact. LIGAMENTS Cruciates:  Intact. Collaterals:  Intact. CARTILAGE Patellofemoral:  Normal. Medial: Since the prior MRI, there has been marked progression of cartilage loss about the medial compartment. A full-thickness cartilage  defect along the medial femoral condyle measures approximately 2 cm AP by 2.2 cm transverse. Full-thickness cartilage defect of the medial tibial plateau measures 1.2 cm transverse by the 1.3 cm AP. Lateral:  Normal. Joint: New moderate joint effusion is increased since the prior exam. Popliteal Fossa:  No Baker's cyst. Extensor Mechanism:  Intact. Bones: New subchondral edema is present in both the medial tibial plateau and medial femoral condyle. No fracture. Other: None. IMPRESSION: Status post debridement of a medial meniscal tear. Small remnant of the medial meniscus is severely degenerated and extruded peripherally. No focal tear is present. Marked progression of hyaline cartilage loss about the medial compartment with new subchondral edema in both the medial tibial plateau and medial femoral condyle stick comparison MRI. Electronically Signed   By: Inge Rise M.D.    On: 11/22/2016 09:21    Assessment & Plan:   Dalyn was seen today for hypothyroidism and annual exam.  Diagnoses and all orders for this visit:  Localized edema- He has asymptomatic lower extremity pitting edema.  His BNP is normal.  His urinalysis is negative for protein.  His EKG is normal.  This is all reassuring that there is not a secondary or metabolic cause.  It is most consistent with high sodium intake.  He agrees to decrease his sodium intake and elevate his lower extremities. -     Brain natriuretic peptide; Future -     Urinalysis, Routine w reflex microscopic; Future -     EKG 12-Lead  Obesity (BMI 30.0-34.9)- He has mild prediabetes.  He agrees to work on his lifestyle modifications. -     Hemoglobin A1c; Future  Acquired hypothyroidism- His TSH is in the normal range.  He will remain on the current dose of levothyroxine. -     TSH; Future -     levothyroxine (SYNTHROID, LEVOTHROID) 100 MCG tablet; Take 1 tablet (100 mcg total) by mouth daily.  Healthcare maintenance- Exam completed, labs reviewed, vaccines reviewed and updated, colon cancer screening is up-to-date, patient education material was given. -     Lipid panel; Future -     Comprehensive metabolic panel; Future -     CBC with Differential/Platelet; Future -     PSA; Future -     HIV antibody; Future   I have discontinued Simona Huh T. Pasquarello's GLUCOSAMINE 1500 COMPLEX, latanoprost, LOTEMAX, Omega 3, ketoconazole, Ibuprofen-Famotidine, Diclofenac Sodium, oxyCODONE-acetaminophen, LUMIGAN, and methylPREDNISolone. I am also having him maintain his bimatoprost, Brimonidine Tartrate-Timolol (COMBIGAN OP), omeprazole, and levothyroxine.  Meds ordered this encounter  Medications  . levothyroxine (SYNTHROID, LEVOTHROID) 100 MCG tablet    Sig: Take 1 tablet (100 mcg total) by mouth daily.    Dispense:  90 tablet    Refill:  1     Follow-up: Return in about 6 months (around 10/04/2017).  Scarlette Calico, MD

## 2017-04-06 NOTE — Patient Instructions (Signed)

## 2017-04-07 ENCOUNTER — Encounter: Payer: Self-pay | Admitting: Internal Medicine

## 2017-04-07 LAB — HIV ANTIBODY (ROUTINE TESTING W REFLEX): HIV 1&2 Ab, 4th Generation: NONREACTIVE

## 2017-04-07 MED ORDER — LEVOTHYROXINE SODIUM 100 MCG PO TABS: 100.0000 ug | ORAL_TABLET | Freq: Every day | ORAL | 1 refills | Status: DC

## 2017-04-14 DIAGNOSIS — H10413 Chronic giant papillary conjunctivitis, bilateral: Secondary | ICD-10-CM | POA: Diagnosis not present

## 2017-05-18 DIAGNOSIS — M1711 Unilateral primary osteoarthritis, right knee: Secondary | ICD-10-CM | POA: Diagnosis not present

## 2017-05-28 ENCOUNTER — Telehealth: Payer: Self-pay

## 2017-05-28 ENCOUNTER — Other Ambulatory Visit: Payer: Self-pay | Admitting: Internal Medicine

## 2017-05-28 DIAGNOSIS — E039 Hypothyroidism, unspecified: Secondary | ICD-10-CM

## 2017-05-28 MED ORDER — LEVOTHYROXINE SODIUM 100 MCG PO TABS: 100.0000 ug | ORAL_TABLET | Freq: Every day | ORAL | 1 refills | Status: DC

## 2017-05-29 NOTE — Telephone Encounter (Signed)
Script was faxed in yesterday by Dr. Ronnald Ramp.

## 2017-06-02 ENCOUNTER — Other Ambulatory Visit: Payer: Self-pay | Admitting: Internal Medicine

## 2017-06-02 DIAGNOSIS — K219 Gastro-esophageal reflux disease without esophagitis: Secondary | ICD-10-CM

## 2017-06-02 MED ORDER — OMEPRAZOLE 40 MG PO CPDR: 40.0000 mg | DELAYED_RELEASE_CAPSULE | Freq: Every day | ORAL | 1 refills | Status: DC

## 2017-06-17 ENCOUNTER — Ambulatory Visit (INDEPENDENT_AMBULATORY_CARE_PROVIDER_SITE_OTHER): Payer: 59

## 2017-06-17 DIAGNOSIS — Z299 Encounter for prophylactic measures, unspecified: Secondary | ICD-10-CM | POA: Diagnosis not present

## 2017-07-14 DIAGNOSIS — H401132 Primary open-angle glaucoma, bilateral, moderate stage: Secondary | ICD-10-CM | POA: Diagnosis not present

## 2017-07-14 DIAGNOSIS — Z961 Presence of intraocular lens: Secondary | ICD-10-CM | POA: Diagnosis not present

## 2017-07-23 ENCOUNTER — Emergency Department (HOSPITAL_COMMUNITY): Admission: EM | Admit: 2017-07-23 | Discharge: 2017-07-23 | Discharge status: Against Medical Advice | Payer: 59

## 2017-07-23 DIAGNOSIS — K219 Gastro-esophageal reflux disease without esophagitis: Secondary | ICD-10-CM | POA: Diagnosis not present

## 2017-07-23 DIAGNOSIS — H538 Other visual disturbances: Secondary | ICD-10-CM | POA: Diagnosis not present

## 2017-07-23 DIAGNOSIS — H539 Unspecified visual disturbance: Secondary | ICD-10-CM | POA: Diagnosis not present

## 2017-07-23 DIAGNOSIS — E079 Disorder of thyroid, unspecified: Secondary | ICD-10-CM | POA: Diagnosis not present

## 2017-07-24 DIAGNOSIS — H31091 Other chorioretinal scars, right eye: Secondary | ICD-10-CM | POA: Diagnosis not present

## 2017-07-24 DIAGNOSIS — H401132 Primary open-angle glaucoma, bilateral, moderate stage: Secondary | ICD-10-CM | POA: Diagnosis not present

## 2017-07-24 DIAGNOSIS — H43813 Vitreous degeneration, bilateral: Secondary | ICD-10-CM | POA: Diagnosis not present

## 2017-09-02 DIAGNOSIS — Z961 Presence of intraocular lens: Secondary | ICD-10-CM | POA: Diagnosis not present

## 2017-09-02 DIAGNOSIS — H401132 Primary open-angle glaucoma, bilateral, moderate stage: Secondary | ICD-10-CM | POA: Diagnosis not present

## 2017-09-02 DIAGNOSIS — H43813 Vitreous degeneration, bilateral: Secondary | ICD-10-CM | POA: Diagnosis not present

## 2017-09-10 DIAGNOSIS — D225 Melanocytic nevi of trunk: Secondary | ICD-10-CM | POA: Diagnosis not present

## 2017-09-10 DIAGNOSIS — L570 Actinic keratosis: Secondary | ICD-10-CM | POA: Diagnosis not present

## 2017-09-10 DIAGNOSIS — D1801 Hemangioma of skin and subcutaneous tissue: Secondary | ICD-10-CM | POA: Diagnosis not present

## 2017-09-10 DIAGNOSIS — Z85828 Personal history of other malignant neoplasm of skin: Secondary | ICD-10-CM | POA: Diagnosis not present

## 2017-09-21 ENCOUNTER — Ambulatory Visit (INDEPENDENT_AMBULATORY_CARE_PROVIDER_SITE_OTHER): Payer: 59

## 2017-09-21 DIAGNOSIS — Z299 Encounter for prophylactic measures, unspecified: Secondary | ICD-10-CM

## 2017-11-08 ENCOUNTER — Other Ambulatory Visit: Payer: Self-pay | Admitting: Internal Medicine

## 2017-11-08 DIAGNOSIS — E039 Hypothyroidism, unspecified: Secondary | ICD-10-CM

## 2017-11-18 ENCOUNTER — Telehealth: Payer: Self-pay | Admitting: Internal Medicine

## 2017-11-18 DIAGNOSIS — E039 Hypothyroidism, unspecified: Secondary | ICD-10-CM

## 2017-11-18 DIAGNOSIS — K219 Gastro-esophageal reflux disease without esophagitis: Secondary | ICD-10-CM

## 2017-12-02 MED ORDER — LEVOTHYROXINE SODIUM 100 MCG PO TABS: 100.0000 ug | ORAL_TABLET | Freq: Every day | ORAL | 0 refills | Status: DC

## 2017-12-02 NOTE — Telephone Encounter (Signed)
Contacted pt and explained that PCP needed to see him again in 6 months to follow chronic conditions and at his CPE appt in 03/2017 there was some noted BLE pitting edema.   Pt has been scheduled for his follow up on 12/23/2017

## 2017-12-02 NOTE — Telephone Encounter (Signed)
Pt would like to speak with Dr. Ronnald Ramp or his nurse regarding the denial for is medications to be refilled. He will be in a meeting from 12-3pm. CB#: [phone]

## 2017-12-02 NOTE — Addendum Note (Signed)
Addended by: Karle Barr on: 12/02/2017 04:34 PM   Modules accepted: Orders

## 2017-12-04 ENCOUNTER — Telehealth: Payer: Self-pay | Admitting: Internal Medicine

## 2017-12-04 ENCOUNTER — Other Ambulatory Visit: Payer: Self-pay | Admitting: *Deleted

## 2017-12-04 DIAGNOSIS — K219 Gastro-esophageal reflux disease without esophagitis: Secondary | ICD-10-CM

## 2017-12-04 MED ORDER — OMEPRAZOLE 40 MG PO CPDR: 40.0000 mg | DELAYED_RELEASE_CAPSULE | Freq: Every day | ORAL | 0 refills | Status: DC

## 2017-12-04 NOTE — Telephone Encounter (Signed)
Copied from [place] [id number]. Topic: Quick Communication - Rx Refill/Question >> Dec 04, 2017  9:43 AM Carolyn Stare wrote: Medication  omeprazole (PRILOSEC) 40 MG capsule  Has the patient contacted their pharmacy yes    Preferred Pharmacy  [facility]    Agent: Please be advised that RX refills may take up to 3 business days. We ask that you follow-up with your pharmacy.

## 2017-12-18 ENCOUNTER — Telehealth: Payer: Self-pay

## 2017-12-18 NOTE — Telephone Encounter (Signed)
Copied from [place] [phone]. Topic: General - Other >> Dec 18, 2017 12:32 PM Jay Holmes L wrote: Reason for CRM: Patient wants a call back to find out why he has an appt on 08/14 for thyroid and edema. He says his thyroid is under control and he has not had edema and he thinks the appt was scheduled in the wrong chart. >> Dec 18, 2017 12:54 PM Para Skeans A wrote: This patient states he did not speak with anyone, nor did he set up an appointment he states that this appointment should be for his wife. His wife and him see two different doctors. I informed him I will speak with Dr.Jones CMA about the phone note that is on 7/10 to try and see what is going on here.  >> Dec 18, 2017  1:24 PM Jay Holmes, CMA wrote: Called pt back and informed why an appt was needed. Pt stated that he has never had to do that with another dr. Abbott Pao stated that he is not sure if he is satisfied with the services. I stated to pt that I wanted him to seek services that gives him the best care and that is why we wanted to follow up with him and his chronic condition. Pt was convinced  that he does not have any thyroid issue. I explained that he was taking a medication that indicated there is an issue with his thyroid. Pt stated that he wanted to seek health care else where. I encouraged him to do what he feels is best and that we are happy to take care of his needs but hope that he continues advocating for his health no matter where he decides to go.

## 2017-12-23 ENCOUNTER — Ambulatory Visit: Payer: 59 | Admitting: Internal Medicine

## 2017-12-23 ENCOUNTER — Other Ambulatory Visit (INDEPENDENT_AMBULATORY_CARE_PROVIDER_SITE_OTHER): Payer: 59

## 2017-12-23 ENCOUNTER — Encounter: Payer: Self-pay | Admitting: Internal Medicine

## 2017-12-23 VITALS — BP 120/78 | HR 80 | Temp 98.0°F | Resp 16 | Ht 68.0 in | Wt 197.0 lb

## 2017-12-23 DIAGNOSIS — E039 Hypothyroidism, unspecified: Secondary | ICD-10-CM | POA: Diagnosis not present

## 2017-12-23 DIAGNOSIS — K219 Gastro-esophageal reflux disease without esophagitis: Secondary | ICD-10-CM

## 2017-12-23 LAB — CBC WITH DIFFERENTIAL/PLATELET
BASOS ABS: 0.1 10*3/uL (ref 0.0–0.1)
Basophils Relative: 0.9 % (ref 0.0–3.0)
EOS ABS: 0.2 10*3/uL (ref 0.0–0.7)
Eosinophils Relative: 3.7 % (ref 0.0–5.0)
HCT: 44.5 % (ref 39.0–52.0)
Hemoglobin: 15.1 g/dL (ref 13.0–17.0)
LYMPHS ABS: 2.1 10*3/uL (ref 0.7–4.0)
Lymphocytes Relative: 35.1 % (ref 12.0–46.0)
MCHC: 34 g/dL (ref 30.0–36.0)
MCV: 91.1 fl (ref 78.0–100.0)
MONO ABS: 0.6 10*3/uL (ref 0.1–1.0)
Monocytes Relative: 10.3 % (ref 3.0–12.0)
NEUTROS ABS: 2.9 10*3/uL (ref 1.4–7.7)
NEUTROS PCT: 50 % (ref 43.0–77.0)
PLATELETS: 208 10*3/uL (ref 150.0–400.0)
RBC: 4.89 Mil/uL (ref 4.22–5.81)
RDW: 14.2 % (ref 11.5–15.5)
WBC: 5.9 10*3/uL (ref 4.0–10.5)

## 2017-12-23 LAB — TSH: TSH: 1.38 u[IU]/mL (ref 0.35–4.50)

## 2017-12-23 MED ORDER — LEVOTHYROXINE SODIUM 100 MCG PO TABS: 100.0000 ug | ORAL_TABLET | Freq: Every day | ORAL | 1 refills | Status: DC

## 2017-12-23 NOTE — Progress Notes (Signed)
Subjective:  Patient ID: Jay Holmes, male    DOB: [birth year]  Age: 62 y.o. MRN: [id number]  CC: Hypothyroidism   HPI Jay Holmes presents for f/up - He has intentionally lost weight over the last year with decreased caloric intake and increased activity.  He feels well today and offers no complaints.  His heartburn is well controlled.  He denies any recent episodes of fatigue, constipation, or edema.  Outpatient Medications Prior to Visit  Medication Sig Dispense Refill  . bimatoprost (LUMIGAN) 0.03 % ophthalmic solution Place 1 drop into both eyes at bedtime.      . Brimonidine Tartrate-Timolol (COMBIGAN OP) Apply to eye.    [name] omeprazole (PRILOSEC) 40 MG capsule Take 1 capsule (40 mg total) by mouth daily. 30 capsule 0  . levothyroxine (SYNTHROID, LEVOTHROID) 100 MCG tablet Take 1 tablet (100 mcg total) by mouth daily. 30 tablet 0   No facility-administered medications prior to visit.     ROS Review of Systems  Constitutional: Negative for chills, diaphoresis, fatigue and unexpected weight change.  HENT: Negative.   Eyes: Negative for visual disturbance.  Respiratory: Negative for cough, chest tightness, shortness of breath and wheezing.   Cardiovascular: Negative for chest pain, palpitations and leg swelling.  Gastrointestinal: Negative for abdominal pain, constipation, diarrhea, nausea and vomiting.  Endocrine: Negative for cold intolerance and heat intolerance.  Genitourinary: Negative.  Negative for difficulty urinating and dysuria.  Musculoskeletal: Negative.  Negative for arthralgias, myalgias and neck pain.  Skin: Negative.   Neurological: Negative.  Negative for dizziness, weakness, light-headedness and headaches.  Hematological: Negative for adenopathy. Does not bruise/bleed easily.  Psychiatric/Behavioral: Negative.     Objective:  BP 120/78 (BP Location: Left Arm, Patient Position: Sitting, Cuff Size: Normal)   Pulse 80   Temp 98 F (36.7 C)   Resp 16    Ht 5\' 8"  (1.727 m)   Wt 197 lb (89.4 kg)   SpO2 97%   BMI 29.95 kg/m   BP Readings from Last 3 Encounters:  12/23/17 120/78  04/06/17 130/88  10/27/16 117/71    Wt Readings from Last 3 Encounters:  12/23/17 197 lb (89.4 kg)  04/06/17 214 lb (97.1 kg)  10/27/16 185 lb (83.9 kg)    Physical Exam  Constitutional: He is oriented to person, place, and time. No distress.  HENT:  Mouth/Throat: Oropharynx is clear and moist. No oropharyngeal exudate.  Eyes: Conjunctivae are normal. No scleral icterus.  Neck: Normal range of motion. Neck supple. No JVD present. No tracheal deviation present. No thyromegaly present.  Cardiovascular: Normal rate, regular rhythm and normal heart sounds. Exam reveals no gallop.  No murmur heard. Pulmonary/Chest: Effort normal and breath sounds normal. No respiratory distress. He has no wheezes. He has no rhonchi. He has no rales.  Abdominal: Soft. Normal appearance and bowel sounds are normal. He exhibits no mass. There is no hepatosplenomegaly. There is no tenderness.  Musculoskeletal: Normal range of motion. He exhibits no edema, tenderness or deformity.  Lymphadenopathy:    He has no cervical adenopathy.  Neurological: He is alert and oriented to person, place, and time.  Skin: Skin is warm and dry. No rash noted. He is not diaphoretic.  Vitals reviewed.   Lab Results  Component Value Date   WBC 5.9 12/23/2017   HGB 15.1 12/23/2017   HCT 44.5 12/23/2017   PLT 208.0 12/23/2017   GLUCOSE 98 04/06/2017   CHOL 147 04/06/2017   TRIG 120.0 04/06/2017   HDL  37.10 (L) 04/06/2017   LDLCALC 86 04/06/2017   ALT 37 04/06/2017   AST 23 04/06/2017   NA 141 04/06/2017   K 4.3 04/06/2017   CL 104 04/06/2017   CREATININE 1.04 04/06/2017   BUN 20 04/06/2017   CO2 28 04/06/2017   TSH 1.38 12/23/2017   PSA 0.27 04/06/2017   HGBA1C 5.9 04/06/2017    Mr Knee Right W/o Contrast  Result Date: 11/22/2016 CLINICAL DATA:  Medial right knee pain and swelling.  Status post right knee surgery 09/04/2016 for medial meniscal tear. No known injury. EXAM: MRI OF THE RIGHT KNEE WITHOUT CONTRAST TECHNIQUE: Multiplanar, multisequence MR imaging of the knee was performed. No intravenous contrast was administered. COMPARISON:  MRI right knee 08/26/2016. FINDINGS: MENISCI Medial meniscus: Since the prior MRI, the patient has undergone debridement of a tear of the posterior horn and body of the medial meniscus. Small remnant of the body of the medial meniscus is extruded peripherally out of the joint and severely degenerated. No focal tear is seen. Lateral meniscus:  Intact. LIGAMENTS Cruciates:  Intact. Collaterals:  Intact. CARTILAGE Patellofemoral:  Normal. Medial: Since the prior MRI, there has been marked progression of cartilage loss about the medial compartment. A full-thickness cartilage defect along the medial femoral condyle measures approximately 2 cm AP by 2.2 cm transverse. Full-thickness cartilage defect of the medial tibial plateau measures 1.2 cm transverse by the 1.3 cm AP. Lateral:  Normal. Joint: New moderate joint effusion is increased since the prior exam. Popliteal Fossa:  No Baker's cyst. Extensor Mechanism:  Intact. Bones: New subchondral edema is present in both the medial tibial plateau and medial femoral condyle. No fracture. Other: None. IMPRESSION: Status post debridement of a medial meniscal tear. Small remnant of the medial meniscus is severely degenerated and extruded peripherally. No focal tear is present. Marked progression of hyaline cartilage loss about the medial compartment with new subchondral edema in both the medial tibial plateau and medial femoral condyle stick comparison MRI. Electronically Signed   By: Inge Rise M.D.   On: 11/22/2016 09:21    Assessment & Plan:   [name] was seen today for hypothyroidism.  Diagnoses and all orders for this visit:  Acquired hypothyroidism- His TSH is in the normal range.  He will remain on the  current dose of levothyroxine. -     Cancel: TSH; Future -     TSH; Future -     levothyroxine (SYNTHROID, LEVOTHROID) 100 MCG tablet; Take 1 tablet (100 mcg total) by mouth daily.  Gastroesophageal reflux disease without esophagitis- His symptoms are well controlled.  No complications noted.  Will continue the PPI. -     CBC with Differential/Platelet; Future   I am having Simona Huh T. Mouser maintain his bimatoprost, Brimonidine Tartrate-Timolol (COMBIGAN OP), omeprazole, and levothyroxine.  Meds ordered this encounter  Medications  . levothyroxine (SYNTHROID, LEVOTHROID) 100 MCG tablet    Sig: Take 1 tablet (100 mcg total) by mouth daily.    Dispense:  90 tablet    Refill:  1     Follow-up: Return in about 3 months (around 03/25/2018).  Scarlette Calico, MD

## 2017-12-23 NOTE — Patient Instructions (Signed)
Hypothyroidism Hypothyroidism is a disorder of the thyroid. The thyroid is a large gland that is located in the lower front of the neck. The thyroid releases hormones that control how the body works. With hypothyroidism, the thyroid does not make enough of these hormones. What are the causes? Causes of hypothyroidism may include:  Viral infections.  Pregnancy.  Your own defense system (immune system) attacking your thyroid.  Certain medicines.  Birth defects.  Past radiation treatments to your head or neck.  Past treatment with radioactive iodine.  Past surgical removal of part or all of your thyroid.  Problems with the gland that is located in the center of your brain (pituitary).  What are the signs or symptoms? Signs and symptoms of hypothyroidism may include:  Feeling as though you have no energy (lethargy).  Inability to tolerate cold.  Weight gain that is not explained by a change in diet or exercise habits.  Dry skin.  Coarse hair.  Menstrual irregularity.  Slowing of thought processes.  Constipation.  Sadness or depression.  How is this diagnosed? Your health care provider may diagnose hypothyroidism with blood tests and ultrasound tests. How is this treated? Hypothyroidism is treated with medicine that replaces the hormones that your body does not make. After you begin treatment, it may take several weeks for symptoms to go away. Follow these instructions at home:  Take medicines only as directed by your health care provider.  If you start taking any new medicines, tell your health care provider.  Keep all follow-up visits as directed by your health care provider. This is important. As your condition improves, your dosage needs may change. You will need to have blood tests regularly so that your health care provider can watch your condition. Contact a health care provider if:  Your symptoms do not get better with treatment.  You are taking thyroid  replacement medicine and: ? You sweat excessively. ? You have tremors. ? You feel anxious. ? You lose weight rapidly. ? You cannot tolerate heat. ? You have emotional swings. ? You have diarrhea. ? You feel weak. Get help right away if:  You develop chest pain.  You develop an irregular heartbeat.  You develop a rapid heartbeat. This information is not intended to replace advice given to you by your health care provider. Make sure you discuss any questions you have with your health care provider. Document Released: 04/28/2005 Document Revised: 10/04/2015 Document Reviewed: 09/13/2013 Elsevier Interactive Patient Education  2018 [employer]  

## 2018-01-01 ENCOUNTER — Other Ambulatory Visit: Payer: Self-pay | Admitting: Internal Medicine

## 2018-01-01 DIAGNOSIS — E039 Hypothyroidism, unspecified: Secondary | ICD-10-CM

## 2018-01-01 DIAGNOSIS — K219 Gastro-esophageal reflux disease without esophagitis: Secondary | ICD-10-CM

## 2018-01-11 ENCOUNTER — Other Ambulatory Visit: Payer: Self-pay | Admitting: Internal Medicine

## 2018-01-11 DIAGNOSIS — K219 Gastro-esophageal reflux disease without esophagitis: Secondary | ICD-10-CM

## 2018-01-20 DIAGNOSIS — H401133 Primary open-angle glaucoma, bilateral, severe stage: Secondary | ICD-10-CM | POA: Diagnosis not present

## 2018-01-25 ENCOUNTER — Ambulatory Visit: Payer: 59

## 2018-01-26 ENCOUNTER — Ambulatory Visit (INDEPENDENT_AMBULATORY_CARE_PROVIDER_SITE_OTHER): Payer: 59

## 2018-01-26 DIAGNOSIS — Z23 Encounter for immunization: Secondary | ICD-10-CM | POA: Diagnosis not present

## 2018-03-16 DIAGNOSIS — C4441 Basal cell carcinoma of skin of scalp and neck: Secondary | ICD-10-CM | POA: Diagnosis not present

## 2018-03-16 DIAGNOSIS — L570 Actinic keratosis: Secondary | ICD-10-CM | POA: Diagnosis not present

## 2018-03-16 DIAGNOSIS — L821 Other seborrheic keratosis: Secondary | ICD-10-CM | POA: Diagnosis not present

## 2018-03-16 DIAGNOSIS — D225 Melanocytic nevi of trunk: Secondary | ICD-10-CM | POA: Diagnosis not present

## 2018-03-16 DIAGNOSIS — Z85828 Personal history of other malignant neoplasm of skin: Secondary | ICD-10-CM | POA: Diagnosis not present

## 2018-03-17 DIAGNOSIS — M25561 Pain in right knee: Secondary | ICD-10-CM | POA: Insufficient documentation

## 2018-04-21 ENCOUNTER — Encounter: Payer: 59 | Admitting: Internal Medicine

## 2018-04-21 DIAGNOSIS — C4441 Basal cell carcinoma of skin of scalp and neck: Secondary | ICD-10-CM | POA: Diagnosis not present

## 2018-04-27 NOTE — Patient Instructions (Signed)
Jay Holmes  04/27/2018   Your procedure is scheduled on: 05-03-18   Report to [facility] Main  Entrance    Report to admitting at 9:00AM    Call this number if you have problems the morning of surgery [phone]     Remember: Do not eat food or drink liquids :After Midnight. BRUSH YOUR TEETH MORNING OF SURGERY AND RINSE YOUR MOUTH OUT, NO CHEWING GUM CANDY OR MINTS.     Take these medicines the morning of surgery with A SIP OF WATER: SYNTHROID, PRILOSEC, EYE DROPS                                You may not have any metal on your body including hair pins and              piercings  Do not wear jewelry, make-up, lotions, powders or perfumes, deodorant                          Men may shave face and neck.   Do not bring valuables to the hospital. Jay Holmes.  Contacts, dentures or bridgework may not be worn into surgery.  Leave suitcase in the car. After surgery it may be brought to your room.                   Please read over the following fact sheets you were given: _____________________________________________________________________             [facility] - Preparing for Surgery Before surgery, you can play an important role.  Because skin is not sterile, your skin needs to be as free of germs as possible.  You can reduce the number of germs on your skin by washing with CHG (chlorahexidine gluconate) soap before surgery.  CHG is an antiseptic cleaner which kills germs and bonds with the skin to continue killing germs even after washing. Please DO NOT use if you have an allergy to CHG or antibacterial soaps.  If your skin becomes reddened/irritated stop using the CHG and inform your nurse when you arrive at Short Stay. Do not shave (including legs and underarms) for at least 48 hours prior to the first CHG shower.  You may shave your face/neck. Please follow these instructions carefully:  1.   Shower with CHG Soap the night before surgery and the  morning of Surgery.  2.  If you choose to wash your hair, wash your hair first as usual with your  normal  shampoo.  3.  After you shampoo, rinse your hair and body thoroughly to remove the  shampoo.                           4.  Use CHG as you would any other liquid soap.  You can apply chg directly  to the skin and wash                       Gently with a scrungie or clean washcloth.  5.  Apply the CHG Soap to your body ONLY FROM THE NECK DOWN.   Do not use on face/  open                           Wound or open sores. Avoid contact with eyes, ears mouth and genitals (private parts).                       Wash face,  Genitals (private parts) with your normal soap.             6.  Wash thoroughly, paying special attention to the area where your surgery  will be performed.  7.  Thoroughly rinse your body with warm water from the neck down.  8.  DO NOT shower/wash with your normal soap after using and rinsing off  the CHG Soap.                9.  Pat yourself dry with a clean towel.            10.  Wear clean pajamas.            11.  Place clean sheets on your bed the night of your first shower and do not  sleep with pets. Day of Surgery : Do not apply any lotions/deodorants the morning of surgery.  Please wear clean clothes to the hospital/surgery center.  FAILURE TO FOLLOW THESE INSTRUCTIONS MAY RESULT IN THE CANCELLATION OF YOUR SURGERY PATIENT SIGNATURE_________________________________  NURSE SIGNATURE__________________________________  ________________________________________________________________________   Jay Holmes  An incentive spirometer is a tool that can help keep your lungs clear and active. This tool measures how well you are filling your lungs with each breath. Taking long deep breaths may help reverse or decrease the chance of developing breathing (pulmonary) problems (especially infection) following:  A long  period of time when you are unable to move or be active. BEFORE THE PROCEDURE   If the spirometer includes an indicator to show your best effort, your nurse or respiratory therapist will set it to a desired goal.  If possible, sit up straight or lean slightly forward. Try not to slouch.  Hold the incentive spirometer in an upright position. INSTRUCTIONS FOR USE  1. Sit on the edge of your bed if possible, or sit up as far as you can in bed or on a chair. 2. Hold the incentive spirometer in an upright position. 3. Breathe out normally. 4. Place the mouthpiece in your mouth and seal your lips tightly around it. 5. Breathe in slowly and as deeply as possible, raising the piston or the ball toward the top of the column. 6. Hold your breath for 3-5 seconds or for as long as possible. Allow the piston or ball to fall to the bottom of the column. 7. Remove the mouthpiece from your mouth and breathe out normally. 8. Rest for a few seconds and repeat Steps 1 through 7 at least 10 times every 1-2 hours when you are awake. Take your time and take a few normal breaths between deep breaths. 9. The spirometer may include an indicator to show your best effort. Use the indicator as a goal to work toward during each repetition. 10. After each set of 10 deep breaths, practice coughing to be sure your lungs are clear. If you have an incision (the cut made at the time of surgery), support your incision when coughing by placing a pillow or rolled up towels firmly against it. Once you are able to get out of bed, walk around indoors and  cough well. You may stop using the incentive spirometer when instructed by your caregiver.  RISKS AND COMPLICATIONS  Take your time so you do not get dizzy or light-headed.  If you are in pain, you may need to take or ask for pain medication before doing incentive spirometry. It is harder to take a deep breath if you are having pain. AFTER USE  Rest and breathe slowly and  easily.  It can be helpful to keep track of a log of your progress. Your caregiver can provide you with a simple table to help with this. If you are using the spirometer at home, follow these instructions: [place] IF:   You are having difficultly using the spirometer.  You have trouble using the spirometer as often as instructed.  Your pain medication is not giving enough relief while using the spirometer.  You develop fever of 100.5 F (38.1 C) or higher. SEEK IMMEDIATE MEDICAL CARE IF:   You cough up bloody sputum that had not been present before.  You develop fever of 102 F (38.9 C) or greater.  You develop worsening pain at or near the incision site. MAKE SURE YOU:   Understand these instructions.  Will watch your condition.  Will get help right away if you are not doing well or get worse. Document Released: 09/08/2006 Document Revised: 07/21/2011 Document Reviewed: 11/09/2006 [employer] Patient Information 2014 [employer], [place].   ________________________________________________________________________  WHAT IS A BLOOD TRANSFUSION? Blood Transfusion Information  A transfusion is the replacement of blood or some of its parts. Blood is made up of multiple cells which provide different functions.  Red blood cells carry oxygen and are used for blood loss replacement.  White blood cells fight against infection.  Platelets control bleeding.  Plasma helps clot blood.  Other blood products are available for specialized needs, such as hemophilia or other clotting disorders. BEFORE THE TRANSFUSION  Who gives blood for transfusions?   Healthy volunteers who are fully evaluated to make sure their blood is safe. This is blood bank blood. Transfusion therapy is the safest it has ever been in the practice of medicine. Before blood is taken from a donor, a complete history is taken to make sure that person has no history of diseases nor engages in risky social  behavior (examples are intravenous drug use or sexual activity with multiple partners). The donor's travel history is screened to minimize risk of transmitting infections, such as malaria. The donated blood is tested for signs of infectious diseases, such as HIV and hepatitis. The blood is then tested to be sure it is compatible with you in order to minimize the chance of a transfusion reaction. If you or a relative donates blood, this is often done in anticipation of surgery and is not appropriate for emergency situations. It takes many days to process the donated blood. RISKS AND COMPLICATIONS Although transfusion therapy is very safe and saves many lives, the main dangers of transfusion include:   Getting an infectious disease.  Developing a transfusion reaction. This is an allergic reaction to something in the blood you were given. Every precaution is taken to prevent this. The decision to have a blood transfusion has been considered carefully by your caregiver before blood is given. Blood is not given unless the benefits outweigh the risks. AFTER THE TRANSFUSION  Right after receiving a blood transfusion, you will usually feel much better and more energetic. This is especially true if your red blood cells have gotten low (anemic).  The transfusion raises the level of the red blood cells which carry oxygen, and this usually causes an energy increase.  The nurse administering the transfusion will monitor you carefully for complications. HOME CARE INSTRUCTIONS  No special instructions are needed after a transfusion. You may find your energy is better. Speak with your caregiver about any limitations on activity for underlying diseases you may have. SEEK MEDICAL CARE IF:   Your condition is not improving after your transfusion.  You develop redness or irritation at the intravenous (IV) site. SEEK IMMEDIATE MEDICAL CARE IF:  Any of the following symptoms occur over the next 12 hours:  Shaking  chills.  You have a temperature by mouth above 102 F (38.9 C), not controlled by medicine.  Chest, back, or muscle pain.  People around you feel you are not acting correctly or are confused.  Shortness of breath or difficulty breathing.  Dizziness and fainting.  You get a rash or develop hives.  You have a decrease in urine output.  Your urine turns a dark color or changes to pink, red, or brown. Any of the following symptoms occur over the next 10 days:  You have a temperature by mouth above 102 F (38.9 C), not controlled by medicine.  Shortness of breath.  Weakness after normal activity.  The white part of the eye turns yellow (jaundice).  You have a decrease in the amount of urine or are urinating less often.  Your urine turns a dark color or changes to pink, red, or brown. Document Released: 04/25/2000 Document Revised: 07/21/2011 Document Reviewed: 12/13/2007 [facility] Patient Information 2014 [place], [place].  _______________________________________________________________________

## 2018-04-27 NOTE — Progress Notes (Signed)
SURGICAL CLEARANCE , THOMAS JONES 04-14-18 ON CHART

## 2018-04-27 NOTE — H&P (Signed)
TOTAL KNEE ADMISSION H&P  Patient is being admitted for right total knee arthroplasty.  Subjective:  Chief Complaint:right knee pain.  HPI: DYLIN BREEDEN, 62 y.o. male, has a history of pain and functional disability in the right knee due to arthritis and has failed non-surgical conservative treatments for greater than 12 weeks to includecorticosteriod injections, viscosupplementation injections and activity modification.  Onset of symptoms was gradual, starting several years ago with gradually worsening course since that time. The patient noted prior procedures on the knee to include  menisectomy on the right knee(s).  Patient currently rates pain in the right knee(s) at 7 out of 10 with activity. Patient has worsening of pain with activity and weight bearing and pain that interferes with activities of daily living.  Patient has evidence of medial bone-on-bone arthritis with varus deformity, as well as tibial subluxation laterally by imaging studies. There is no active infection.  Patient Active Problem List   Diagnosis Date Noted  . Localized edema 04/06/2017  . Umbilical hernia 11/02/7626  . Healthcare maintenance 07/21/2012  . Glaucoma 08/14/2009  . GERD 07/16/2009  . BACK PAIN, LUMBAR 03/01/2009  . Obesity (BMI 30.0-34.9) 04/18/2008  . HEARING LOSS 03/30/2008  . Hypothyroidism 04/14/2007   Past Medical History:  Diagnosis Date  . Diastasis recti 07/21/2012   questionable umbilical hernia  . Glaucoma   . Personal history of diseases of skin and subcutaneous tissue   . Personal history of urinary calculi   . Unspecified hypothyroidism   . Unspecified sinusitis (chronic)     Past Surgical History:  Procedure Laterality Date  . Cataract surgery Bilateral   . TONSILLECTOMY AND ADENOIDECTOMY      No current facility-administered medications for this encounter.    Current Outpatient Medications  Medication Sig Dispense Refill Last Dose  . bimatoprost (LUMIGAN) 0.01 % SOLN Place  1 drop into both eyes at bedtime.    Taking  . Brimonidine Tartrate-Timolol (COMBIGAN OP) Apply 1 drop to eye 2 (two) times daily.    Taking  . calcium gluconate 500 MG tablet Take 1 tablet by mouth daily.     [name] GLUCOSAMINE-CHONDROITIN PO Take 1,200 mg by mouth daily.     [name] ketoconazole (NIZORAL) 2 % cream Apply 1 application topically daily as needed (rosacea).     [name] levothyroxine (SYNTHROID, LEVOTHROID) 100 MCG tablet TAKE 1 TABLET BY MOUTH  DAILY (Patient taking differently: Take 100 mcg by mouth daily before breakfast. ) 90 tablet 1   . omeprazole (PRILOSEC) 40 MG capsule TAKE ONE CAPSULE BY MOUTH DAILY (Patient taking differently: Take 40 mg by mouth daily. ) 90 capsule 1   . levothyroxine (SYNTHROID, LEVOTHROID) 100 MCG tablet Take 1 tablet (100 mcg total) by mouth daily. (Patient not taking: Reported on 04/22/2018) 90 tablet 1 Not Taking at Unknown time  . omeprazole (PRILOSEC) 40 MG capsule TAKE 1 CAPSULE BY MOUTH  DAILY (Patient not taking: Reported on 04/22/2018) 90 capsule 1 Not Taking at Unknown time   No Known Allergies  Social History   Tobacco Use  . Smoking status: Never Smoker  . Smokeless tobacco: Never Used  Substance Use Topics  . Alcohol use: No    Family History  Problem Relation Age of Onset  . Memory loss Mother   . Hypertension Mother   . Hyperlipidemia Mother   . Cancer Mother        breast cancer  . Cancer Father        prostate  . Diabetes  Father        diet controlled  . Cleft lip Son   . Heart disease Maternal Grandmother   . Colon cancer Neg Hx      Review of Systems  Constitutional: Negative for chills and fever.  HENT: Negative for congestion, sore throat and tinnitus.   Eyes: Negative for double vision, photophobia and pain.  Respiratory: Negative for cough, shortness of breath and wheezing.   Cardiovascular: Negative for chest pain, palpitations and orthopnea.  Gastrointestinal: Negative for heartburn, nausea and vomiting.  Genitourinary:  Negative for dysuria, frequency and urgency.  Musculoskeletal: Positive for joint pain.  Neurological: Negative for dizziness, weakness and headaches.    Objective:  Physical Exam  Well nourished and well developed.  General: Alert and oriented x3, cooperative and pleasant, no acute distress.  Head: normocephalic, atraumatic, neck supple.  Eyes: EOMI.  Respiratory: breath sounds clear in all fields, no wheezing, rales, or rhonchi. Cardiovascular: Regular rate and rhythm, no murmurs, gallops or rubs.  Abdomen: non-tender to palpation and soft, normoactive bowel sounds. Musculoskeletal: Right Knee Exam: No effusion. Varus deformity. Range of motion is 0-125 degrees.  No crepitus on range of motion of the knee. Positive medial joint line tenderness. No lateral joint line tenderness. Stable knee. Pseudolaxity when corrected from varus to neutral alignment. Calves soft and nontender. Motor function intact in LE. Strength 5/5 LE bilaterally. Neuro: Distal pulses 2+. Sensation to light touch intact in LE.  Vital signs in last 24 hours: Blood pressure: 124/84 mmHg Pulse: 72 bpm  Labs:   Estimated body mass index is 29.95 kg/m as calculated from the following:   Height as of 12/23/17: 5\' 8"  (1.727 m).   Weight as of 12/23/17: 89.4 kg.   Imaging Review Plain radiographs demonstrate severe degenerative joint disease of the right knee(s). The overall alignment issignificant varus. The bone quality appears to be adequate for age and reported activity level.   Preoperative templating of the joint replacement has been completed, documented, and submitted to the Operating Room personnel in order to optimize intra-operative equipment management.   Anticipated LOS equal to or greater than 2 midnights due to - Age 74 and older with one or more of the following:  - Obesity  - Expected need for hospital services (PT, OT, Nursing) required for safe  discharge  - Anticipated need for  postoperative skilled nursing care or inpatient rehab  - Active co-morbidities: None OR   - Unanticipated findings during/Post Surgery: None  - Patient is a high risk of re-admission due to: None     Assessment/Plan:  End stage arthritis, right knee   The patient history, physical examination, clinical judgment of the provider and imaging studies are consistent with end stage degenerative joint disease of the right knee(s) and total knee arthroplasty is deemed medically necessary. The treatment options including medical management, injection therapy arthroscopy and arthroplasty were discussed at length. The risks and benefits of total knee arthroplasty were presented and reviewed. The risks due to aseptic loosening, infection, stiffness, patella tracking problems, thromboembolic complications and other imponderables were discussed. The patient acknowledged the explanation, agreed to proceed with the plan and consent was signed. Patient is being admitted for inpatient treatment for surgery, pain control, PT, OT, prophylactic antibiotics, VTE prophylaxis, progressive ambulation and ADL's and discharge planning. The patient is planning to be discharged home.   Therapy Plans: outpatient therapy at [facility] Disposition: Home with wife Planned DVT Prophylaxis: Aspirin 325 mg BID DME needed: Gilford Rile PCP: Marcello Moores  Ronnald Ramp, MD TXA: IV Allergies: NKDA Anesthesia Concerns: None BMI: 29.5  - Patient was instructed on what medications to stop prior to surgery. - Follow-up visit in 2 weeks with Dr. Wynelle Link - Begin physical therapy following surgery - Pre-operative lab work as pre-surgical testing - Prescriptions will be provided in hospital at time of discharge  Theresa Duty, PA-C Orthopedic Surgery [facility] [place]

## 2018-04-28 ENCOUNTER — Other Ambulatory Visit: Payer: Self-pay

## 2018-04-28 ENCOUNTER — Encounter (HOSPITAL_COMMUNITY): Payer: Self-pay

## 2018-04-28 ENCOUNTER — Encounter (HOSPITAL_COMMUNITY)
Admission: RE | Admit: 2018-04-28 | Discharge: 2018-04-28 | Disposition: A | Discharge status: Home / Self Care | Payer: 59 | Source: Ambulatory Visit | Attending: Orthopedic Surgery | Admitting: Orthopedic Surgery

## 2018-04-28 DIAGNOSIS — Z01812 Encounter for preprocedural laboratory examination: Secondary | ICD-10-CM | POA: Insufficient documentation

## 2018-04-28 DIAGNOSIS — M1711 Unilateral primary osteoarthritis, right knee: Secondary | ICD-10-CM | POA: Insufficient documentation

## 2018-04-28 HISTORY — DX: Unspecified osteoarthritis, unspecified site: M19.90

## 2018-04-28 LAB — PROTIME-INR
INR: 0.94
Prothrombin Time: 12.5 seconds (ref 11.4–15.2)

## 2018-04-28 LAB — COMPREHENSIVE METABOLIC PANEL
ALBUMIN: 4.3 g/dL (ref 3.5–5.0)
ALT: 31 U/L (ref 0–44)
ANION GAP: 8 (ref 5–15)
AST: 24 U/L (ref 15–41)
Alkaline Phosphatase: 103 U/L (ref 38–126)
BUN: 21 mg/dL (ref 8–23)
CO2: 27 mmol/L (ref 22–32)
Calcium: 9.1 mg/dL (ref 8.9–10.3)
Chloride: 105 mmol/L (ref 98–111)
Creatinine, Ser: 1.11 mg/dL (ref 0.61–1.24)
GFR calc Af Amer: >60 mL/min (ref >60)
GFR calc non Af Amer: >60 mL/min (ref >60)
GLUCOSE: 113 mg/dL — AB (ref 70–99)
Potassium: 4.7 mmol/L (ref 3.5–5.1)
SODIUM: 140 mmol/L (ref 135–145)
Total Bilirubin: 0.7 mg/dL (ref 0.3–1.2)
Total Protein: 7.5 g/dL (ref 6.5–8.1)

## 2018-04-28 LAB — CBC
HCT: 48 % (ref 39.0–52.0)
Hemoglobin: 15.8 g/dL (ref 13.0–17.0)
MCH: 31.2 pg (ref 26.0–34.0)
MCHC: 32.9 g/dL (ref 30.0–36.0)
MCV: 94.7 fL (ref 80.0–100.0)
Platelets: 197 10*3/uL (ref 150–400)
RBC: 5.07 MIL/uL (ref 4.22–5.81)
RDW: 13.4 % (ref 11.5–15.5)
WBC: 5.7 10*3/uL (ref 4.0–10.5)
nRBC: 0 % (ref 0.0–0.2)

## 2018-04-28 LAB — SURGICAL PCR SCREEN
MRSA, PCR: NEGATIVE
Staphylococcus aureus: NEGATIVE

## 2018-04-28 LAB — APTT: aPTT: 38 seconds — ABNORMAL HIGH (ref 24–36)

## 2018-04-28 LAB — ABO/RH: ABO/RH(D): O POS

## 2018-05-02 MED ORDER — BUPIVACAINE LIPOSOME 1.3 % IJ SUSP
20.0000 mL | INTRAMUSCULAR | Status: DC
  Filled 2018-05-02: qty 20

## 2018-05-03 ENCOUNTER — Other Ambulatory Visit: Payer: Self-pay

## 2018-05-03 ENCOUNTER — Encounter (HOSPITAL_COMMUNITY): Payer: Self-pay

## 2018-05-03 ENCOUNTER — Encounter (HOSPITAL_COMMUNITY)
Admission: RE | Disposition: A | Discharge status: Home / Self Care | Payer: Self-pay | Source: Home / Self Care | Attending: Orthopedic Surgery

## 2018-05-03 ENCOUNTER — Inpatient Hospital Stay (HOSPITAL_COMMUNITY)
Admission: RE | Admit: 2018-05-03 | Discharge: 2018-05-04 | DRG: 470 | Disposition: A | Discharge status: Home / Self Care | Payer: 59 | Attending: Orthopedic Surgery | Admitting: Orthopedic Surgery

## 2018-05-03 ENCOUNTER — Inpatient Hospital Stay (HOSPITAL_COMMUNITY): Payer: 59 | Admitting: Anesthesiology

## 2018-05-03 DIAGNOSIS — Z79899 Other long term (current) drug therapy: Secondary | ICD-10-CM | POA: Diagnosis not present

## 2018-05-03 DIAGNOSIS — G8918 Other acute postprocedural pain: Secondary | ICD-10-CM | POA: Diagnosis not present

## 2018-05-03 DIAGNOSIS — K219 Gastro-esophageal reflux disease without esophagitis: Secondary | ICD-10-CM | POA: Diagnosis not present

## 2018-05-03 DIAGNOSIS — M1711 Unilateral primary osteoarthritis, right knee: Principal | ICD-10-CM | POA: Diagnosis present

## 2018-05-03 DIAGNOSIS — Z6829 Body mass index (BMI) 29.0-29.9, adult: Secondary | ICD-10-CM | POA: Diagnosis not present

## 2018-05-03 DIAGNOSIS — H409 Unspecified glaucoma: Secondary | ICD-10-CM | POA: Diagnosis present

## 2018-05-03 DIAGNOSIS — Z79890 Hormone replacement therapy (postmenopausal): Secondary | ICD-10-CM | POA: Diagnosis not present

## 2018-05-03 DIAGNOSIS — E669 Obesity, unspecified: Secondary | ICD-10-CM | POA: Diagnosis not present

## 2018-05-03 DIAGNOSIS — M1710 Unilateral primary osteoarthritis, unspecified knee: Secondary | ICD-10-CM | POA: Diagnosis present

## 2018-05-03 DIAGNOSIS — H9190 Unspecified hearing loss, unspecified ear: Secondary | ICD-10-CM | POA: Diagnosis present

## 2018-05-03 DIAGNOSIS — E039 Hypothyroidism, unspecified: Secondary | ICD-10-CM | POA: Diagnosis not present

## 2018-05-03 DIAGNOSIS — M179 Osteoarthritis of knee, unspecified: Secondary | ICD-10-CM | POA: Diagnosis present

## 2018-05-03 HISTORY — PX: TOTAL KNEE ARTHROPLASTY: SHX125

## 2018-05-03 LAB — TYPE AND SCREEN
ABO/RH(D): O POS
Antibody Screen: NEGATIVE

## 2018-05-03 SURGERY — ARTHROPLASTY, KNEE, TOTAL
Anesthesia: Spinal | Site: Knee | Laterality: Right

## 2018-05-03 MED ORDER — ONDANSETRON HCL 4 MG/2ML IJ SOLN
INTRAMUSCULAR | Status: AC
  Filled 2018-05-03: qty 2

## 2018-05-03 MED ORDER — POLYETHYLENE GLYCOL 3350 17 G PO PACK: 17.0000 g | PACK | Freq: Every day | ORAL | Status: DC | PRN

## 2018-05-03 MED ORDER — ONDANSETRON HCL 4 MG/2ML IJ SOLN: 4.0000 mg | Freq: Once | INTRAMUSCULAR | Status: DC | PRN

## 2018-05-03 MED ORDER — PROPOFOL 500 MG/50ML IV EMUL
INTRAVENOUS | Status: DC | PRN
  Administered 2018-05-03: 40 ug/kg/min via INTRAVENOUS

## 2018-05-03 MED ORDER — METHOCARBAMOL 500 MG IVPB - SIMPLE MED
500.0000 mg | Freq: Four times a day (QID) | INTRAVENOUS | Status: DC | PRN
  Administered 2018-05-03: 500 mg via INTRAVENOUS
  Filled 2018-05-03: qty 50

## 2018-05-03 MED ORDER — METHOCARBAMOL 500 MG IVPB - SIMPLE MED
INTRAVENOUS | Status: AC
  Filled 2018-05-03: qty 50

## 2018-05-03 MED ORDER — SODIUM CHLORIDE (PF) 0.9 % IJ SOLN
INTRAMUSCULAR | Status: AC
  Filled 2018-05-03: qty 10

## 2018-05-03 MED ORDER — PROPOFOL 10 MG/ML IV BOLUS
INTRAVENOUS | Status: AC
  Filled 2018-05-03: qty 20

## 2018-05-03 MED ORDER — BUPIVACAINE HCL (PF) 0.25 % IJ SOLN
INTRAMUSCULAR | Status: AC
  Filled 2018-05-03: qty 30

## 2018-05-03 MED ORDER — STERILE WATER FOR IRRIGATION IR SOLN
Status: DC | PRN
  Administered 2018-05-03: 2000 mL

## 2018-05-03 MED ORDER — CHLORHEXIDINE GLUCONATE 4 % EX LIQD: 60.0000 mL | Freq: Once | CUTANEOUS | Status: DC

## 2018-05-03 MED ORDER — SODIUM CHLORIDE 0.9 % IR SOLN
Status: DC | PRN
  Administered 2018-05-03: 1000 mL

## 2018-05-03 MED ORDER — PANTOPRAZOLE SODIUM 40 MG PO TBEC
40.0000 mg | DELAYED_RELEASE_TABLET | Freq: Every day | ORAL | Status: DC
  Administered 2018-05-04: 40 mg via ORAL
  Filled 2018-05-03: qty 1

## 2018-05-03 MED ORDER — SODIUM CHLORIDE (PF) 0.9 % IJ SOLN
INTRAMUSCULAR | Status: AC
  Filled 2018-05-03: qty 50

## 2018-05-03 MED ORDER — ASPIRIN EC 325 MG PO TBEC
325.0000 mg | DELAYED_RELEASE_TABLET | Freq: Two times a day (BID) | ORAL | Status: DC
  Administered 2018-05-04: 325 mg via ORAL
  Filled 2018-05-03: qty 1

## 2018-05-03 MED ORDER — MEPERIDINE HCL 50 MG/ML IJ SOLN: 6.2500 mg | INTRAMUSCULAR | Status: DC | PRN

## 2018-05-03 MED ORDER — TIMOLOL MALEATE 0.5 % OP SOLN
1.0000 [drp] | Freq: Two times a day (BID) | OPHTHALMIC | Status: DC
  Administered 2018-05-03: 1 [drp] via OPHTHALMIC
  Filled 2018-05-03: qty 5

## 2018-05-03 MED ORDER — TRANEXAMIC ACID-NACL 1000-0.7 MG/100ML-% IV SOLN
1000.0000 mg | INTRAVENOUS | Status: AC
  Administered 2018-05-03: 1000 mg via INTRAVENOUS
  Filled 2018-05-03: qty 100

## 2018-05-03 MED ORDER — OXYCODONE HCL 5 MG/5ML PO SOLN: 5.0000 mg | Freq: Once | ORAL | Status: DC | PRN

## 2018-05-03 MED ORDER — FENTANYL CITRATE (PF) 100 MCG/2ML IJ SOLN
INTRAMUSCULAR | Status: AC
  Filled 2018-05-03: qty 2

## 2018-05-03 MED ORDER — EPHEDRINE SULFATE-NACL 50-0.9 MG/10ML-% IV SOSY
PREFILLED_SYRINGE | INTRAVENOUS | Status: DC | PRN
  Administered 2018-05-03 (×3): 5 mg via INTRAVENOUS

## 2018-05-03 MED ORDER — LIDOCAINE 2% (20 MG/ML) 5 ML SYRINGE
INTRAMUSCULAR | Status: AC
  Filled 2018-05-03: qty 5

## 2018-05-03 MED ORDER — SODIUM CHLORIDE 0.9 % IV SOLN
INTRAVENOUS | Status: DC
  Administered 2018-05-03: 13:00:00 via INTRAVENOUS

## 2018-05-03 MED ORDER — FENTANYL CITRATE (PF) 100 MCG/2ML IJ SOLN: 50.0000 ug | Freq: Once | INTRAMUSCULAR | Status: DC

## 2018-05-03 MED ORDER — BRIMONIDINE TARTRATE-TIMOLOL 0.2-0.5 % OP SOLN: 1.0000 [drp] | Freq: Two times a day (BID) | OPHTHALMIC | Status: DC

## 2018-05-03 MED ORDER — METOCLOPRAMIDE HCL 5 MG PO TABS: 5.0000 mg | ORAL_TABLET | Freq: Three times a day (TID) | ORAL | Status: DC | PRN

## 2018-05-03 MED ORDER — LATANOPROST 0.005 % OP SOLN
1.0000 [drp] | Freq: Every day | OPHTHALMIC | Status: DC
  Administered 2018-05-03: 1 [drp] via OPHTHALMIC
  Filled 2018-05-03: qty 2.5

## 2018-05-03 MED ORDER — ONDANSETRON HCL 4 MG/2ML IJ SOLN: 4.0000 mg | Freq: Four times a day (QID) | INTRAMUSCULAR | Status: DC | PRN

## 2018-05-03 MED ORDER — ASPIRIN 325 MG PO TBEC: 325.0000 mg | DELAYED_RELEASE_TABLET | Freq: Two times a day (BID) | ORAL | 0 refills | Status: AC

## 2018-05-03 MED ORDER — ACETAMINOPHEN 160 MG/5ML PO SOLN: 325.0000 mg | ORAL | Status: DC | PRN

## 2018-05-03 MED ORDER — DIPHENHYDRAMINE HCL 12.5 MG/5ML PO ELIX: 12.5000 mg | ORAL_SOLUTION | ORAL | Status: DC | PRN

## 2018-05-03 MED ORDER — GABAPENTIN 300 MG PO CAPS
300.0000 mg | ORAL_CAPSULE | Freq: Once | ORAL | Status: AC
  Administered 2018-05-03: 300 mg via ORAL
  Filled 2018-05-03: qty 1

## 2018-05-03 MED ORDER — OXYCODONE HCL 5 MG PO TABS
10.0000 mg | ORAL_TABLET | ORAL | Status: DC | PRN
  Administered 2018-05-03 – 2018-05-04 (×2): 10 mg via ORAL
  Filled 2018-05-03: qty 3

## 2018-05-03 MED ORDER — METOCLOPRAMIDE HCL 5 MG/ML IJ SOLN: 5.0000 mg | Freq: Three times a day (TID) | INTRAMUSCULAR | Status: DC | PRN

## 2018-05-03 MED ORDER — BISACODYL 10 MG RE SUPP: 10.0000 mg | Freq: Every day | RECTAL | Status: DC | PRN

## 2018-05-03 MED ORDER — MENTHOL 3 MG MT LOZG: 1.0000 | LOZENGE | OROMUCOSAL | Status: DC | PRN

## 2018-05-03 MED ORDER — METHOCARBAMOL 500 MG PO TABS: 500.0000 mg | ORAL_TABLET | Freq: Four times a day (QID) | ORAL | 0 refills | Status: DC | PRN

## 2018-05-03 MED ORDER — ONDANSETRON HCL 4 MG/2ML IJ SOLN
INTRAMUSCULAR | Status: DC | PRN
  Administered 2018-05-03: 4 mg via INTRAVENOUS

## 2018-05-03 MED ORDER — ONDANSETRON HCL 4 MG PO TABS: 4.0000 mg | ORAL_TABLET | Freq: Four times a day (QID) | ORAL | Status: DC | PRN

## 2018-05-03 MED ORDER — BRIMONIDINE TARTRATE 0.2 % OP SOLN
1.0000 [drp] | Freq: Two times a day (BID) | OPHTHALMIC | Status: DC
  Administered 2018-05-03: 1 [drp] via OPHTHALMIC
  Filled 2018-05-03: qty 5

## 2018-05-03 MED ORDER — FLEET ENEMA 7-19 GM/118ML RE ENEM: 1.0000 | ENEMA | Freq: Once | RECTAL | Status: DC | PRN

## 2018-05-03 MED ORDER — MIDAZOLAM HCL 2 MG/2ML IJ SOLN: 1.0000 mg | Freq: Once | INTRAMUSCULAR | Status: DC

## 2018-05-03 MED ORDER — DEXAMETHASONE SODIUM PHOSPHATE 10 MG/ML IJ SOLN
INTRAMUSCULAR | Status: AC
  Filled 2018-05-03: qty 1

## 2018-05-03 MED ORDER — CEFAZOLIN SODIUM-DEXTROSE 2-4 GM/100ML-% IV SOLN
2.0000 g | Freq: Four times a day (QID) | INTRAVENOUS | Status: AC
  Administered 2018-05-03 (×2): 2 g via INTRAVENOUS
  Filled 2018-05-03 (×2): qty 100

## 2018-05-03 MED ORDER — FENTANYL CITRATE (PF) 100 MCG/2ML IJ SOLN
INTRAMUSCULAR | Status: AC
  Administered 2018-05-03: 100 ug
  Filled 2018-05-03: qty 2

## 2018-05-03 MED ORDER — LACTATED RINGERS IV SOLN
INTRAVENOUS | Status: DC
  Administered 2018-05-03: 10:00:00 via INTRAVENOUS

## 2018-05-03 MED ORDER — PHENOL 1.4 % MT LIQD: 1.0000 | OROMUCOSAL | Status: DC | PRN

## 2018-05-03 MED ORDER — ACETAMINOPHEN 500 MG PO TABS
1000.0000 mg | ORAL_TABLET | Freq: Four times a day (QID) | ORAL | Status: AC
  Administered 2018-05-03 – 2018-05-04 (×4): 1000 mg via ORAL
  Filled 2018-05-03 (×4): qty 2

## 2018-05-03 MED ORDER — ROCURONIUM BROMIDE 10 MG/ML (PF) SYRINGE
PREFILLED_SYRINGE | INTRAVENOUS | Status: AC
  Filled 2018-05-03: qty 10

## 2018-05-03 MED ORDER — LEVOTHYROXINE SODIUM 100 MCG PO TABS
100.0000 ug | ORAL_TABLET | Freq: Every day | ORAL | Status: DC
  Administered 2018-05-04: 100 ug via ORAL
  Filled 2018-05-03: qty 1

## 2018-05-03 MED ORDER — SODIUM CHLORIDE (PF) 0.9 % IJ SOLN
INTRAMUSCULAR | Status: DC | PRN
  Administered 2018-05-03: 60 mL

## 2018-05-03 MED ORDER — OXYCODONE HCL 5 MG PO TABS: 5.0000 mg | ORAL_TABLET | Freq: Four times a day (QID) | ORAL | 0 refills | Status: DC | PRN

## 2018-05-03 MED ORDER — DEXAMETHASONE SODIUM PHOSPHATE 10 MG/ML IJ SOLN
8.0000 mg | Freq: Once | INTRAMUSCULAR | Status: AC
  Administered 2018-05-03: 8 mg via INTRAVENOUS

## 2018-05-03 MED ORDER — DOCUSATE SODIUM 100 MG PO CAPS
100.0000 mg | ORAL_CAPSULE | Freq: Two times a day (BID) | ORAL | Status: DC
  Administered 2018-05-03 – 2018-05-04 (×3): 100 mg via ORAL
  Filled 2018-05-03 (×3): qty 1

## 2018-05-03 MED ORDER — DEXAMETHASONE SODIUM PHOSPHATE 10 MG/ML IJ SOLN
10.0000 mg | Freq: Once | INTRAMUSCULAR | Status: AC
  Administered 2018-05-04: 10 mg via INTRAVENOUS
  Filled 2018-05-03: qty 1

## 2018-05-03 MED ORDER — ACETAMINOPHEN 325 MG PO TABS: 325.0000 mg | ORAL_TABLET | ORAL | Status: DC | PRN

## 2018-05-03 MED ORDER — GABAPENTIN 300 MG PO CAPS: 300.0000 mg | ORAL_CAPSULE | Freq: Three times a day (TID) | ORAL | 0 refills | Status: DC

## 2018-05-03 MED ORDER — MIDAZOLAM HCL 2 MG/2ML IJ SOLN
INTRAMUSCULAR | Status: AC
  Administered 2018-05-03: 2 mg
  Filled 2018-05-03: qty 2

## 2018-05-03 MED ORDER — ACETAMINOPHEN 10 MG/ML IV SOLN
1000.0000 mg | Freq: Four times a day (QID) | INTRAVENOUS | Status: DC
  Administered 2018-05-03: 1000 mg via INTRAVENOUS
  Filled 2018-05-03: qty 100

## 2018-05-03 MED ORDER — BUPIVACAINE LIPOSOME 1.3 % IJ SUSP
INTRAMUSCULAR | Status: DC | PRN
  Administered 2018-05-03: 20 mL

## 2018-05-03 MED ORDER — OXYCODONE HCL 5 MG PO TABS: 5.0000 mg | ORAL_TABLET | Freq: Once | ORAL | Status: DC | PRN

## 2018-05-03 MED ORDER — ROPIVACAINE HCL 7.5 MG/ML IJ SOLN
INTRAMUSCULAR | Status: DC | PRN
  Administered 2018-05-03: 30 mL via PERINEURAL

## 2018-05-03 MED ORDER — GABAPENTIN 300 MG PO CAPS
300.0000 mg | ORAL_CAPSULE | Freq: Three times a day (TID) | ORAL | Status: DC
  Administered 2018-05-03 – 2018-05-04 (×3): 300 mg via ORAL
  Filled 2018-05-03 (×3): qty 1

## 2018-05-03 MED ORDER — FENTANYL CITRATE (PF) 100 MCG/2ML IJ SOLN: 25.0000 ug | INTRAMUSCULAR | Status: DC | PRN

## 2018-05-03 MED ORDER — CEFAZOLIN SODIUM-DEXTROSE 2-4 GM/100ML-% IV SOLN
2.0000 g | INTRAVENOUS | Status: AC
  Administered 2018-05-03: 2 g via INTRAVENOUS
  Filled 2018-05-03: qty 100

## 2018-05-03 MED ORDER — MORPHINE SULFATE (PF) 4 MG/ML IV SOLN
1.0000 mg | INTRAVENOUS | Status: DC | PRN
  Administered 2018-05-03: 1 mg via INTRAVENOUS
  Administered 2018-05-03: 2 mg via INTRAVENOUS
  Filled 2018-05-03 (×2): qty 1

## 2018-05-03 MED ORDER — OXYCODONE HCL 5 MG PO TABS
5.0000 mg | ORAL_TABLET | ORAL | Status: DC | PRN
  Administered 2018-05-03 – 2018-05-04 (×4): 10 mg via ORAL
  Filled 2018-05-03 (×5): qty 2

## 2018-05-03 MED ORDER — BUPIVACAINE IN DEXTROSE 0.75-8.25 % IT SOLN
INTRATHECAL | Status: DC | PRN
  Administered 2018-05-03: 1.8 mL via INTRATHECAL

## 2018-05-03 MED ORDER — TRANEXAMIC ACID-NACL 1000-0.7 MG/100ML-% IV SOLN
1000.0000 mg | Freq: Once | INTRAVENOUS | Status: AC
  Administered 2018-05-03: 1000 mg via INTRAVENOUS
  Filled 2018-05-03: qty 100

## 2018-05-03 MED ORDER — METHOCARBAMOL 500 MG PO TABS
500.0000 mg | ORAL_TABLET | Freq: Four times a day (QID) | ORAL | Status: DC | PRN
  Administered 2018-05-03 – 2018-05-04 (×2): 500 mg via ORAL
  Filled 2018-05-03 (×2): qty 1

## 2018-05-03 SURGICAL SUPPLY — 63 items
ATTUNE MED DOME PAT 38 KNEE (Knees) ×1 IMPLANT
ATTUNE MED DOME PAT 38MM KNEE (Knees) ×1 IMPLANT
ATTUNE PS FEM RT SZ 5 CEM KNEE (Femur) ×2 IMPLANT
BAG SPEC THK2 15X12 ZIP CLS (MISCELLANEOUS) ×1
BAG ZIPLOCK 12X15 (MISCELLANEOUS) ×3 IMPLANT
BANDAGE ACE 6X5 VEL STRL LF (GAUZE/BANDAGES/DRESSINGS) ×3 IMPLANT
BASE TIBIA ATTUNE KNEE SYS SZ6 (Knees) IMPLANT
BLADE SAG 18X100X1.27 (BLADE) ×3 IMPLANT
BLADE SAW SGTL 11.0X1.19X90.0M (BLADE) ×3 IMPLANT
BLADE SURG SZ10 CARB STEEL (BLADE) ×6 IMPLANT
BOWL SMART MIX CTS (DISPOSABLE) ×3 IMPLANT
BSPLAT TIB 6 CMNT ROT PLAT STR (Knees) ×1 IMPLANT
CEMENT HV SMART SET (Cement) ×6 IMPLANT
CLOSURE WOUND 1/2 X4 (GAUZE/BANDAGES/DRESSINGS) ×1
COVER SURGICAL LIGHT HANDLE (MISCELLANEOUS) ×3 IMPLANT
COVER WAND RF STERILE (DRAPES) ×2 IMPLANT
CUFF TOURN SGL QUICK 34 (TOURNIQUET CUFF) ×3
CUFF TRNQT CYL 34X4X40X1 (TOURNIQUET CUFF) ×1 IMPLANT
DECANTER SPIKE VIAL GLASS SM (MISCELLANEOUS) ×5 IMPLANT
DRAPE U-SHAPE 47X51 STRL (DRAPES) ×3 IMPLANT
DRSG ADAPTIC 3X8 NADH LF (GAUZE/BANDAGES/DRESSINGS) ×3 IMPLANT
DRSG PAD ABDOMINAL 8X10 ST (GAUZE/BANDAGES/DRESSINGS) ×3 IMPLANT
DURAPREP 26ML APPLICATOR (WOUND CARE) ×3 IMPLANT
ELECT REM PT RETURN 15FT ADLT (MISCELLANEOUS) ×3 IMPLANT
EVACUATOR 1/8 PVC DRAIN (DRAIN) ×3 IMPLANT
GAUZE SPONGE 4X4 12PLY STRL (GAUZE/BANDAGES/DRESSINGS) ×3 IMPLANT
GLOVE BIO SURGEON STRL SZ7 (GLOVE) ×1 IMPLANT
GLOVE BIO SURGEON STRL SZ8 (GLOVE) ×3 IMPLANT
GLOVE BIOGEL PI IND STRL 6.5 (GLOVE) ×1 IMPLANT
GLOVE BIOGEL PI IND STRL 7.0 (GLOVE) ×1 IMPLANT
GLOVE BIOGEL PI IND STRL 7.5 (GLOVE) IMPLANT
GLOVE BIOGEL PI IND STRL 8 (GLOVE) ×1 IMPLANT
GLOVE BIOGEL PI INDICATOR 6.5 (GLOVE) ×2
GLOVE BIOGEL PI INDICATOR 7.0 (GLOVE) ×4
GLOVE BIOGEL PI INDICATOR 7.5 (GLOVE) ×6
GLOVE BIOGEL PI INDICATOR 8 (GLOVE) ×2
GLOVE SURG SS PI 6.5 STRL IVOR (GLOVE) ×3 IMPLANT
GOWN STRL REUS W/TWL LRG LVL3 (GOWN DISPOSABLE) ×6 IMPLANT
GOWN STRL REUS W/TWL XL LVL3 (GOWN DISPOSABLE) ×7 IMPLANT
HANDPIECE INTERPULSE COAX TIP (DISPOSABLE) ×3
HOLDER FOLEY CATH W/STRAP (MISCELLANEOUS) ×2 IMPLANT
IMMOBILIZER KNEE 20 (SOFTGOODS) ×3
IMMOBILIZER KNEE 20 THIGH 36 (SOFTGOODS) ×1 IMPLANT
INSERT TIB ATTUNE RP SZ5X14 (Insert) ×2 IMPLANT
MANIFOLD NEPTUNE II (INSTRUMENTS) ×3 IMPLANT
NS IRRIG 1000ML POUR BTL (IV SOLUTION) ×3 IMPLANT
PACK TOTAL KNEE CUSTOM (KITS) ×3 IMPLANT
PAD ABD 8X10 STRL (GAUZE/BANDAGES/DRESSINGS) ×2 IMPLANT
PADDING CAST COTTON 6X4 STRL (CAST SUPPLIES) ×5 IMPLANT
PIN STEINMAN FIXATION KNEE (PIN) ×2 IMPLANT
PROTECTOR NERVE ULNAR (MISCELLANEOUS) ×3 IMPLANT
SET HNDPC FAN SPRY TIP SCT (DISPOSABLE) ×1 IMPLANT
STRIP CLOSURE SKIN 1/2X4 (GAUZE/BANDAGES/DRESSINGS) ×3 IMPLANT
SUT MNCRL AB 4-0 PS2 18 (SUTURE) ×3 IMPLANT
SUT STRATAFIX 0 PDS 27 VIOLET (SUTURE) ×3
SUT VIC AB 2-0 CT1 27 (SUTURE) ×9
SUT VIC AB 2-0 CT1 TAPERPNT 27 (SUTURE) ×3 IMPLANT
SUTURE STRATFX 0 PDS 27 VIOLET (SUTURE) ×1 IMPLANT
TIBIA ATTUNE KNEE SYS BASE SZ6 (Knees) ×3 IMPLANT
TRAY FOLEY MTR SLVR 16FR STAT (SET/KITS/TRAYS/PACK) ×3 IMPLANT
WATER STERILE IRR 1000ML POUR (IV SOLUTION) ×6 IMPLANT
WRAP KNEE MAXI GEL POST OP (GAUZE/BANDAGES/DRESSINGS) ×3 IMPLANT
YANKAUER SUCT BULB TIP 10FT TU (MISCELLANEOUS) ×3 IMPLANT

## 2018-05-03 NOTE — Progress Notes (Signed)
AssistedDr. Oddono with right, ultrasound guided, adductor canal block. Side rails up, monitors on throughout procedure. See vital signs in flow sheet. Tolerated Procedure well.  

## 2018-05-03 NOTE — Anesthesia Procedure Notes (Signed)
Spinal  Start time: 05/03/2018 11:20 AM End time: 05/03/2018 11:25 AM Staffing Resident/CRNA: Victoriano Lain, CRNA Performed: resident/CRNA  Preanesthetic Checklist Completed: patient identified, site marked, surgical consent, pre-op evaluation, timeout performed, IV checked, risks and benefits discussed and monitors and equipment checked Spinal Block Patient position: sitting Prep: site prepped and draped and DuraPrep Patient monitoring: heart rate, continuous pulse ox and blood pressure Approach: midline Location: L3-4 Injection technique: single-shot Needle Needle type: Pencan  Needle gauge: 24 G Needle length: 10 cm Assessment Sensory level: T4 Additional Notes Pt placed in sitting position for spinal placement. Spinal kit expiration date checked and verified. One attempt. + CSF, - heme. Pt tolerated well.

## 2018-05-03 NOTE — Anesthesia Procedure Notes (Signed)
Anesthesia Regional Block: Adductor canal block   Pre-Anesthetic Checklist: ,, timeout performed, Correct Patient, Correct Site, Correct Laterality, Correct Procedure, Correct Position, site marked, Risks and benefits discussed,  Surgical consent,  Pre-op evaluation,  At surgeon's request and post-op pain management  Laterality: Right  Prep: chloraprep       Needles:  Injection technique: Single-shot  Needle Type: Echogenic Stimulator Needle     Needle Length: 5cm  Needle Gauge: 22     Additional Needles:   Procedures:, nerve stimulator,,, ultrasound used (permanent image in chart),,,,  Narrative:  Start time: 05/03/2018 10:47 AM End time: 05/03/2018 10:58 AM Injection made incrementally with aspirations every 5 mL.  Performed by: Personally  Anesthesiologist: Janeece Riggers, MD  Additional Notes: Functioning IV was confirmed and monitors were applied.  A 80mm 22ga Arrow echogenic stimulator needle was used. Sterile prep and drape,hand hygiene and sterile gloves were used. Ultrasound guidance: relevant anatomy identified, needle position confirmed, local anesthetic spread visualized around nerve(s)., vascular puncture avoided.  Image printed for medical record. Negative aspiration and negative test dose prior to incremental administration of local anesthetic. The patient tolerated the procedure well.

## 2018-05-03 NOTE — Op Note (Signed)
OPERATIVE REPORT-TOTAL KNEE ARTHROPLASTY   Pre-operative diagnosis- Osteoarthritis  Right knee(s)  Post-operative diagnosis- Osteoarthritis Right knee(s)  Procedure-  Right  Total Knee Arthroplasty  Surgeon- Dione Plover. Abir Eroh, MD  Assistant- Ardeen Jourdain, PA-C   Anesthesia-  adductor canal block and spinal  EBL-50 mL   Drains Hemovac  Tourniquet time-  Total Tourniquet Time Documented: Thigh (Right) - 36 minutes Total: Thigh (Right) - 36 minutes     Complications- None  Condition-PACU - hemodynamically stable.   Brief Clinical Note  Jay Holmes is a 62 y.o. year old male with end stage OA of his right knee with progressively worsening pain and dysfunction. He has constant pain, with activity and at rest and significant functional deficits with difficulties even with ADLs. He has had extensive non-op management including analgesics, injections of cortisone and viscosupplements, and home exercise program, but remains in significant pain with significant dysfunction. Radiographs show bone on bone arthritis medial and patellofemoral. He presents now for right Total Knee Arthroplasty.    Procedure in detail---   The patient is brought into the operating room and positioned supine on the operating table. After successful administration of  adductor canal block and spinal,   a tourniquet is placed high on the  Right thigh(s) and the lower extremity is prepped and draped in the usual sterile fashion. Time out is performed by the operating team and then the  Right lower extremity is wrapped in Esmarch, knee flexed and the tourniquet inflated to 300 mmHg.       A midline incision is made with a ten blade through the subcutaneous tissue to the level of the extensor mechanism. A fresh blade is used to make a medial parapatellar arthrotomy. Soft tissue over the proximal medial tibia is subperiosteally elevated to the joint line with a knife and into the semimembranosus bursa with a Cobb  elevator. Soft tissue over the proximal lateral tibia is elevated with attention being paid to avoiding the patellar tendon on the tibial tubercle. The patella is everted, knee flexed 90 degrees and the ACL and PCL are removed. Findings are bone on bone medial and patellofemoral with large global osteophytes.        The drill is used to create a starting hole in the distal femur and the canal is thoroughly irrigated with sterile saline to remove the fatty contents. The 5 degree Right  valgus alignment guide is placed into the femoral canal and the distal femoral cutting block is pinned to remove 9 mm off the distal femur. Resection is made with an oscillating saw.      The tibia is subluxed forward and the menisci are removed. The extramedullary alignment guide is placed referencing proximally at the medial aspect of the tibial tubercle and distally along the second metatarsal axis and tibial crest. The block is pinned to remove 38mm off the more deficient medial  side. Resection is made with an oscillating saw. Size 6is the most appropriate size for the tibia and the proximal tibia is prepared with the modular drill and keel punch for that size.      The femoral sizing guide is placed and size 5 is most appropriate. Rotation is marked off the epicondylar axis and confirmed by creating a rectangular flexion gap at 90 degrees. The size 5 cutting block is pinned in this rotation and the anterior, posterior and chamfer cuts are made with the oscillating saw. The intercondylar block is then placed and that cut is made.  Trial size 6 tibial component, trial size 5 posterior stabilized femur and a 14  mm posterior stabilized rotating platform insert trial is placed. Full extension is achieved with excellent varus/valgus and anterior/posterior balance throughout full range of motion. The patella is everted and thickness measured to be 24  mm. Free hand resection is taken to 14 mm, a 38 template is placed, lug holes  are drilled, trial patella is placed, and it tracks normally. Osteophytes are removed off the posterior femur with the trial in place. All trials are removed and the cut bone surfaces prepared with pulsatile lavage. Cement is mixed and once ready for implantation, the size 5 tibial implant, size  6 posterior stabilized femoral component, and the size 38 patella are cemented in place and the patella is held with the clamp. The trial insert is placed and the knee held in full extension. The Exparel (20 ml mixed with 60 ml saline) is injected into the extensor mechanism, posterior capsule, medial and lateral gutters and subcutaneous tissues.  All extruded cement is removed and once the cement is hard the permanent 14 mm posterior stabilized rotating platform insert is placed into the tibial tray.      The wound is copiously irrigated with saline solution and the extensor mechanism closed over a hemovac drain with #1 V-loc suture. The tourniquet is released for a total tourniquet time of 36  minutes. Flexion against gravity is 140 degrees and the patella tracks normally. Subcutaneous tissue is closed with 2.0 vicryl and subcuticular with running 4.0 Monocryl. The incision is cleaned and dried and steri-strips and a bulky sterile dressing are applied. The limb is placed into a knee immobilizer and the patient is awakened and transported to recovery in stable condition.      Please note that a surgical assistant was a medical necessity for this procedure in order to perform it in a safe and expeditious manner. Surgical assistant was necessary to retract the ligaments and vital neurovascular structures to prevent injury to them and also necessary for proper positioning of the limb to allow for anatomic placement of the prosthesis.   Dione Plover Nadir Vasques, MD    05/03/2018, 1:01 PM

## 2018-05-03 NOTE — Anesthesia Preprocedure Evaluation (Signed)
Anesthesia Evaluation  Patient identified by MRN, date of birth, ID band Patient awake    Reviewed: Allergy & Precautions, H&P , NPO status , Patient's Chart, lab work & pertinent test results, reviewed documented beta blocker date and time   Airway Mallampati: II  TM Distance: >3 FB Neck ROM: full    Dental no notable dental hx.    Pulmonary neg pulmonary ROS,    Pulmonary exam normal breath sounds clear to auscultation       Cardiovascular Exercise Tolerance: Good negative cardio ROS   Rhythm:regular Rate:Normal     Neuro/Psych negative neurological ROS  negative psych ROS   GI/Hepatic Neg liver ROS, GERD  ,  Endo/Other  Hypothyroidism   Renal/GU negative Renal ROS  negative genitourinary   Musculoskeletal  (+) Arthritis , Osteoarthritis,    Abdominal   Peds  Hematology negative hematology ROS (+)   Anesthesia Other Findings   Reproductive/Obstetrics negative OB ROS                             Anesthesia Physical Anesthesia Plan  ASA: II  Anesthesia Plan: Spinal   Post-op Pain Management:  Regional for Post-op pain   Induction: Intravenous  PONV Risk Score and Plan: 1 and Treatment may vary due to age or medical condition and Ondansetron  Airway Management Planned: Nasal Cannula and Natural Airway  Additional Equipment:   Intra-op Plan:   Post-operative Plan:   Informed Consent: I have reviewed the patients History and Physical, chart, labs and discussed the procedure including the risks, benefits and alternatives for the proposed anesthesia with the patient or authorized representative who has indicated his/her understanding and acceptance.   Dental Advisory Given  Plan Discussed with: CRNA, Anesthesiologist and Surgeon  Anesthesia Plan Comments: (  )        Anesthesia Quick Evaluation

## 2018-05-03 NOTE — Discharge Instructions (Signed)
° °Dr. Frank Aluisio °Total Joint Specialist °[facility] °[address] °, [place] [address] °[phone] ° °TOTAL KNEE REPLACEMENT POSTOPERATIVE DIRECTIONS ° °Knee Rehabilitation, Guidelines Following Surgery  °Results after knee surgery are often greatly improved when you follow the exercise, range of motion and muscle strengthening exercises prescribed by your doctor. Safety measures are also important to protect the knee from further injury. Any time any of these exercises cause you to have increased pain or swelling in your knee joint, decrease the amount until you are comfortable again and slowly increase them. If you have problems or questions, call your caregiver or physical therapist for advice.  ° °HOME CARE INSTRUCTIONS  °• Remove items at home which could result in a fall. This includes throw rugs or furniture in walking pathways.  °· ICE to the affected knee every three hours for 30 minutes at a time and then as needed for pain and swelling.  Continue to use ice on the knee for pain and swelling from surgery. You may notice swelling that will progress down to the foot and ankle.  This is normal after surgery.  Elevate the leg when you are not up walking on it.   °· Continue to use the breathing machine which will help keep your temperature down.  It is common for your temperature to cycle up and down following surgery, especially at night when you are not up moving around and exerting yourself.  The breathing machine keeps your lungs expanded and your temperature down. °· Do not place pillow under knee, focus on keeping the knee straight while resting ° °DIET °You may resume your previous home diet once your are discharged from the hospital. ° °DRESSING / WOUND CARE / SHOWERING °You may shower 3 days after surgery, but keep the wounds dry during showering.  You may use an occlusive plastic wrap (Press'n Seal for example), NO SOAKING/SUBMERGING IN THE BATHTUB.  If the bandage  gets wet, change with a clean dry gauze.  If the incision gets wet, pat the wound dry with a clean towel. °You may start showering once you are discharged home but do not submerge the incision under water. Just pat the incision dry and apply a dry gauze dressing on daily. °Change the surgical dressing daily and reapply a dry dressing each time. ° °ACTIVITY °Walk with your walker as instructed. °Use walker as long as suggested by your caregivers. °Avoid periods of inactivity such as sitting longer than an hour when not asleep. This helps prevent blood clots.  °You may resume a sexual relationship in one month or when given the OK by your doctor.  °You may return to work once you are cleared by your doctor.  °Do not drive a car for 6 weeks or until released by you surgeon.  °Do not drive while taking narcotics. ° °WEIGHT BEARING °Weight bearing as tolerated with assist device (walker, cane, etc) as directed, use it as long as suggested by your surgeon or therapist, typically at least 4-6 weeks. ° °POSTOPERATIVE CONSTIPATION PROTOCOL °Constipation - defined medically as fewer than three stools per week and severe constipation as less than one stool per week. ° °One of the most common issues patients have following surgery is constipation.  Even if you have a regular bowel pattern at home, your normal regimen is likely to be disrupted due to multiple reasons following surgery.  Combination of anesthesia, postoperative narcotics, change in appetite and fluid intake all can affect your bowels.    In order to avoid complications following surgery, here are some recommendations in order to help you during your recovery period. ° °Colace (docusate) - Pick up an over-the-counter form of Colace or another stool softener and take twice a day as long as you are requiring postoperative pain medications.  Take with a full glass of water daily.  If you experience loose stools or diarrhea, hold the colace until you stool forms back  up.  If your symptoms do not get better within 1 week or if they get worse, check with your doctor. ° °Dulcolax (bisacodyl) - Pick up over-the-counter and take as directed by the product packaging as needed to assist with the movement of your bowels.  Take with a full glass of water.  Use this product as needed if not relieved by Colace only.  ° °MiraLax (polyethylene glycol) - Pick up over-the-counter to have on hand.  MiraLax is a solution that will increase the amount of water in your bowels to assist with bowel movements.  Take as directed and can mix with a glass of water, juice, soda, coffee, or tea.  Take if you go more than two days without a movement. °Do not use MiraLax more than once per day. Call your doctor if you are still constipated or irregular after using this medication for 7 days in a row. ° °If you continue to have problems with postoperative constipation, please contact the office for further assistance and recommendations.  If you experience "the worst abdominal pain ever" or develop nausea or vomiting, please contact the office immediatly for further recommendations for treatment. ° °ITCHING ° If you experience itching with your medications, try taking only a single pain pill, or even half a pain pill at a time.  You can also use Benadryl over the counter for itching or also to help with sleep.  ° °TED HOSE STOCKINGS °Wear the elastic stockings on both legs for three weeks following surgery during the day but you may remove then at night for sleeping. ° °MEDICATIONS °See your medication summary on the “After Visit Summary” that the nursing staff will review with you prior to discharge.  You may have some home medications which will be placed on hold until you complete the course of blood thinner medication.  It is important for you to complete the blood thinner medication as prescribed by your surgeon.  Continue your approved medications as instructed at time of discharge. ° °PRECAUTIONS °If  you experience chest pain or shortness of breath - call 911 immediately for transfer to the hospital emergency department.  °If you develop a fever greater that 101 F, purulent drainage from wound, increased redness or drainage from wound, foul odor from the wound/dressing, or calf pain - CONTACT YOUR SURGEON.   °                                                °FOLLOW-UP APPOINTMENTS °Make sure you keep all of your appointments after your operation with your surgeon and caregivers. You should call the office at the above phone number and make an appointment for approximately two weeks after the date of your surgery or on the date instructed by your surgeon outlined in the "After Visit Summary". ° ° °RANGE OF MOTION AND STRENGTHENING EXERCISES  °Rehabilitation of the knee is important following a knee injury or   an operation. After just a few days of immobilization, the muscles of the thigh which control the knee become weakened and shrink (atrophy). Knee exercises are designed to build up the tone and strength of the thigh muscles and to improve knee motion. Often times heat used for twenty to thirty minutes before working out will loosen up your tissues and help with improving the range of motion but do not use heat for the first two weeks following surgery. These exercises can be done on a training (exercise) mat, on the floor, on a table or on a bed. Use what ever works the best and is most comfortable for you Knee exercises include:  °• Leg Lifts - While your knee is still immobilized in a splint or cast, you can do straight leg raises. Lift the leg to 60 degrees, hold for 3 sec, and slowly lower the leg. Repeat 10-20 times 2-3 times daily. Perform this exercise against resistance later as your knee gets better.  °• Quad and Hamstring Sets - Tighten up the muscle on the front of the thigh (Quad) and hold for 5-10 sec. Repeat this 10-20 times hourly. Hamstring sets are done by pushing the foot backward against an  object and holding for 5-10 sec. Repeat as with quad sets.  °· Leg Slides: Lying on your back, slowly slide your foot toward your buttocks, bending your knee up off the floor (only go as far as is comfortable). Then slowly slide your foot back down until your leg is flat on the floor again. °· Angel Wings: Lying on your back spread your legs to the side as far apart as you can without causing discomfort.  °A rehabilitation program following serious knee injuries can speed recovery and prevent re-injury in the future due to weakened muscles. Contact your doctor or a physical therapist for more information on knee rehabilitation.  ° °IF YOU ARE TRANSFERRED TO A SKILLED REHAB FACILITY °If the patient is transferred to a skilled rehab facility following release from the hospital, a list of the current medications will be sent to the facility for the patient to continue.  When discharged from the skilled rehab facility, please have the facility set up the patient's Home Health Physical Therapy prior to being released. Also, the skilled facility will be responsible for providing the patient with their medications at time of release from the facility to include their pain medication, the muscle relaxants, and their blood thinner medication. If the patient is still at the rehab facility at time of the two week follow up appointment, the skilled rehab facility will also need to assist the patient in arranging follow up appointment in our office and any transportation needs. ° °MAKE SURE YOU:  °• Understand these instructions.  °• Get help right away if you are not doing well or get worse.  ° ° °Pick up stool softner and laxative for home use following surgery while on pain medications. °Do not submerge incision under water. °Please use good hand washing techniques while changing dressing each day. °May shower starting three days after surgery. °Please use a clean towel to pat the incision dry following showers. °Continue to  use ice for pain and swelling after surgery. °Do not use any lotions or creams on the incision until instructed by your surgeon. ° °

## 2018-05-03 NOTE — Interval H&P Note (Signed)
History and Physical Interval Note:  05/03/2018 9:17 AM  Erasmo Score  has presented today for surgery, with the diagnosis of right knee osteoarthritis  The various methods of treatment have been discussed with the patient and family. After consideration of risks, benefits and other options for treatment, the patient has consented to  Procedure(s): TOTAL KNEE ARTHROPLASTY (Right) as a surgical intervention .  The patient's history has been reviewed, patient examined, no change in status, stable for surgery.  I have reviewed the patient's chart and labs.  Questions were answered to the patient's satisfaction.     Pilar Plate Loui [place]

## 2018-05-03 NOTE — Anesthesia Postprocedure Evaluation (Signed)
Anesthesia Post Note  Patient: Jay Holmes  Procedure(s) Performed: TOTAL KNEE ARTHROPLASTY (Right Knee)     Patient location during evaluation: PACU Anesthesia Type: Spinal Level of consciousness: oriented and awake and alert Pain management: pain level controlled Vital Signs Assessment: post-procedure vital signs reviewed and stable Respiratory status: spontaneous breathing, respiratory function stable and patient connected to nasal cannula oxygen Cardiovascular status: blood pressure returned to baseline and stable Postop Assessment: no headache, no backache and no apparent nausea or vomiting Anesthetic complications: no    Last Vitals:  Vitals:   05/03/18 1300 05/03/18 1315  BP: 95/70 109/69  Pulse: 69 66  Resp: 14 20  Temp:    SpO2: 100% 100%    Last Pain:  Vitals:   05/03/18 1256  TempSrc:   PainSc: 0-No pain                 Katrese Shell

## 2018-05-03 NOTE — Transfer of Care (Signed)
Immediate Anesthesia Transfer of Care Note  Patient: Jay Holmes  Procedure(s) Performed: TOTAL KNEE ARTHROPLASTY (Right Knee)  Patient Location: PACU  Anesthesia Type:MAC and Spinal  Level of Consciousness: awake, alert , oriented and patient cooperative  Airway & Oxygen Therapy: Patient Spontanous Breathing and Patient connected to face mask oxygen  Post-op Assessment: Report given to RN and Post -op Vital signs reviewed and stable  Post vital signs: Reviewed and stable  Last Vitals:  Vitals Value Taken Time  BP    Temp    Pulse 71 05/03/2018 12:57 PM  Resp 12 05/03/2018 12:57 PM  SpO2 100 % 05/03/2018 12:57 PM  Vitals shown include unvalidated device data.  Last Pain:  Vitals:   05/03/18 0928  TempSrc: Oral         Complications: No apparent anesthesia complications

## 2018-05-03 NOTE — Evaluation (Signed)
Physical Therapy Evaluation Patient Details Name: Jay Holmes MRN: [id number] DOB: 03/26/1956 Today's Date: 05/03/2018   History of Present Illness  62 yo male s/p R TKR 05/03/18. PMH includes LBP, obesity, glaucoma, cataracts.   Clinical Impression  Pt presents with R knee pain, decreased R knee ROM, difficulty performing bed mobility due to post-operative RLE weakness, and decreased tolerance for ambulation due to R knee pain. Pt to benefit from acute PT to address deficits. Pt ambulated 100 ft with RW with min guard assist, verbal cuing provided throughout. Pt educated on ankle pumps (20/hour) to perform this afternoon/evening to increase circulation, to pt's tolerance and limited by pain. PT to progress mobility as tolerated, and will continue to follow acutely.      Follow Up Recommendations Follow surgeon's recommendation for DC plan and follow-up therapies;Supervision for mobility/OOB(OPPT )    Equipment Recommendations  None recommended by PT    Recommendations for Other Services       Precautions / Restrictions Precautions Precautions: Fall Required Braces or Orthoses: Knee Immobilizer - Right Knee Immobilizer - Right: On when out of bed or walking;Discontinue once straight leg raise with < 10 degree lag Restrictions Weight Bearing Restrictions: No Other Position/Activity Restrictions: WBAT       Mobility  Bed Mobility Overal bed mobility: Needs Assistance Bed Mobility: Supine to Sit     Supine to sit: HOB elevated;Min assist     General bed mobility comments: Min assist for RLE lift assist, pt with increased time and effort to scoot to EOB. Pt reported momentary dizziness sitting EOB, passed.   Transfers Overall transfer level: Needs assistance Equipment used: Rolling walker (2 wheeled) Transfers: Sit to/from Stand Sit to Stand: Min guard;From elevated surface         General transfer comment: Min guard for safety. Verbal cuing for hand placement.    Ambulation/Gait Ambulation/Gait assistance: Min guard;+2 safety/equipment(chair follow ) Gait Distance (Feet): 100 Feet Assistive device: Rolling walker (2 wheeled) Gait Pattern/deviations: Step-to pattern;Decreased stance time - right;Decreased weight shift to right;Antalgic Gait velocity: decr    General Gait Details: Min guard for safety. Verbal cuing for placement in RW, sequencing, turning. Pt with increasing antalgic gait with further ambulation.   Stairs            Wheelchair Mobility    Modified Rankin (Stroke Patients Only)       Balance Overall balance assessment: Mild deficits observed, not formally tested                                           Pertinent Vitals/Pain Pain Assessment: 0-10 Pain Score: 7  Pain Location: R knee, with mobility  Pain Descriptors / Indicators: Sore Pain Intervention(s): Limited activity within patient's tolerance;Repositioned;Monitored during session;Ice applied;Premedicated before session    [place] expects to be discharged to:: Private residence Living Arrangements: Spouse/significant other;Children Available Help at Discharge: Family;Available PRN/intermittently Type of Home: House Home Access: Stairs to enter Entrance Stairs-Rails: Left;Right;Can reach both Entrance Stairs-Number of Steps: 5 Home Layout: Two level;Able to live on main level with bedroom/bathroom Home Equipment: Gilford Rile - 2 wheels;Cane - single point;Bedside commode      Prior Function Level of Independence: Independent               Hand Dominance   Dominant Hand: Right    Extremity/Trunk Assessment   Upper Extremity Assessment  Upper Extremity Assessment: Overall WFL for tasks assessed    Lower Extremity Assessment Lower Extremity Assessment: Overall WFL for tasks assessed;RLE deficits/detail RLE Deficits / Details: suspected post-surgical weakness; able to perform ankle pumps, quad sets, heel  slides with assistance  RLE Sensation: WNL    Cervical / Trunk Assessment Cervical / Trunk Assessment: Normal  Communication   Communication: No difficulties  Cognition Arousal/Alertness: Awake/alert Behavior During Therapy: WFL for tasks assessed/performed;Anxious Overall Cognitive Status: Within Functional Limits for tasks assessed                                        General Comments      Exercises Total Joint Exercises Goniometric ROM: knee aarom ~10-60*, limited by pain    Assessment/Plan    PT Assessment Patient needs continued PT services  PT Problem List Decreased strength;Pain;Decreased range of motion;Decreased activity tolerance;Decreased knowledge of use of DME;Decreased balance;Decreased mobility       PT Treatment Interventions DME instruction;Therapeutic activities;Gait training;Therapeutic exercise;Patient/family education;Stair training;Balance training;Functional mobility training    PT Goals (Current goals can be found in the Care Plan section)  Acute Rehab PT Goals Patient Stated Goal: none stated  PT Goal Formulation: With patient Time For Goal Achievement: 05/10/18 Potential to Achieve Goals: Good    Frequency 7X/week   Barriers to discharge        Co-evaluation               AM-PAC PT "6 Clicks" Mobility  Outcome Measure Help needed turning from your back to your side while in a flat bed without using bedrails?: A Little Help needed moving from lying on your back to sitting on the side of a flat bed without using bedrails?: A Little Help needed moving to and from a bed to a chair (including a wheelchair)?: A Little Help needed standing up from a chair using your arms (e.g., wheelchair or bedside chair)?: A Little Help needed to walk in hospital room?: A Little Help needed climbing 3-5 steps with a railing? : A Lot 6 Click Score: 17    End of Session Equipment Utilized During Treatment: Gait belt, R knee immobilizer   Activity Tolerance: Patient tolerated treatment well Patient left: in chair;with chair alarm set;with call bell/phone within reach;with family/visitor present;with SCD's reapplied Nurse Communication: Mobility status;Patient requests pain meds PT Visit Diagnosis: Other abnormalities of gait and mobility (R26.89);Difficulty in walking, not elsewhere classified (R26.2)    Time: 2924-4628 PT Time Calculation (min) (ACUTE ONLY): 34 min   Charges:   PT Evaluation $PT Eval Low Complexity: 1 Low PT Treatments $Gait Training: 8-22 mins       Julien Girt, PT Acute Rehabilitation Services Pager [phone]  Office [phone]   Jay Holmes D Elonda Husky 05/03/2018, 6:51 PM

## 2018-05-04 LAB — CBC
HEMATOCRIT: 39.3 % (ref 39.0–52.0)
Hemoglobin: 12.9 g/dL — ABNORMAL LOW (ref 13.0–17.0)
MCH: 30.9 pg (ref 26.0–34.0)
MCHC: 32.8 g/dL (ref 30.0–36.0)
MCV: 94.2 fL (ref 80.0–100.0)
Platelets: 170 10*3/uL (ref 150–400)
RBC: 4.17 MIL/uL — ABNORMAL LOW (ref 4.22–5.81)
RDW: 13 % (ref 11.5–15.5)
WBC: 12 10*3/uL — ABNORMAL HIGH (ref 4.0–10.5)
nRBC: 0 % (ref 0.0–0.2)

## 2018-05-04 LAB — BASIC METABOLIC PANEL
Anion gap: 8 (ref 5–15)
BUN: 15 mg/dL (ref 8–23)
CO2: 21 mmol/L — ABNORMAL LOW (ref 22–32)
Calcium: 8 mg/dL — ABNORMAL LOW (ref 8.9–10.3)
Chloride: 106 mmol/L (ref 98–111)
Creatinine, Ser: 1 mg/dL (ref 0.61–1.24)
GFR calc Af Amer: >60 mL/min (ref >60)
GFR calc non Af Amer: >60 mL/min (ref >60)
Glucose, Bld: 153 mg/dL — ABNORMAL HIGH (ref 70–99)
Potassium: 4 mmol/L (ref 3.5–5.1)
Sodium: 135 mmol/L (ref 135–145)

## 2018-05-04 NOTE — Progress Notes (Signed)
Physical Therapy Treatment Patient Details Name: Jay Holmes MRN: [id number] DOB: [birth year] Today's Date: 05/04/2018    History of Present Illness 62 yo male s/p R TKR 05/03/18. PMH includes LBP, obesity, glaucoma, cataracts.     PT Comments    POD # 1 am session Assisted OOB to amb in hallway then Performed some TE's following HEP handout.  Instructed on proper tech, freq as well as use of ICE.     Follow Up Recommendations  Follow surgeon's recommendation for DC plan and follow-up therapies;Supervision for mobility/OOB(OP)     Equipment Recommendations  None recommended by PT    Recommendations for Other Services       Precautions / Restrictions Precautions Precautions: Fall Precaution Comments: pt able to perfrom SLR Restrictions Weight Bearing Restrictions: No Other Position/Activity Restrictions: WBAT     Mobility  Bed Mobility Overal bed mobility: Needs Assistance Bed Mobility: Supine to Sit     Supine to sit: Modified independent (Device/Increase time)     General bed mobility comments: pt able to self perform with increased time and ONE VC proper tech.   Transfers Overall transfer level: Needs assistance Equipment used: Rolling walker (2 wheeled) Transfers: Sit to/from [facility] Sit to Stand: Min guard;From elevated surface         General transfer comment: Min guard for safety. Verbal cuing for hand placement.   Ambulation/Gait Ambulation/Gait assistance: Min guard;Supervision Gait Distance (Feet): 85 Feet Assistive device: Rolling walker (2 wheeled) Gait Pattern/deviations: Step-to pattern;Decreased stance time - right;Decreased weight shift to right;Antalgic Gait velocity: decr    General Gait Details: one VC safety with turns    Marine scientist Rankin (Stroke Patients Only)       Balance                                            Cognition  Arousal/Alertness: Awake/alert Behavior During Therapy: WFL for tasks assessed/performed Overall Cognitive Status: Within Functional Limits for tasks assessed                                        Exercises   Total Knee Replacement TE's 10 reps B LE ankle pumps 10 reps towel squeezes 10 reps knee presses 10 reps heel slides  10 reps SAQ's 10 reps SLR's 10 reps ABD Followed by ICE    General Comments        Pertinent Vitals/Pain Pain Assessment: 0-10 Pain Score: 4  Pain Location: R knee, with mobility  Pain Descriptors / Indicators: Sore;Tightness;Operative site guarding Pain Intervention(s): Monitored during session;Repositioned;Ice applied    Home Living                      Prior Function            PT Goals (current goals can now be found in the care plan section) Progress towards PT goals: Progressing toward goals    Frequency    7X/week      PT Plan Current plan remains appropriate    Co-evaluation              AM-PAC PT "6 Clicks" Mobility   Outcome Measure  Help needed turning from your back to your side while in a flat bed without using bedrails?: A Little Help needed moving from lying on your back to sitting on the side of a flat bed without using bedrails?: A Little Help needed moving to and from a bed to a chair (including a wheelchair)?: A Little Help needed standing up from a chair using your arms (e.g., wheelchair or bedside chair)?: A Little Help needed to walk in hospital room?: A Little Help needed climbing 3-5 steps with a railing? : A Lot 6 Click Score: 17    End of Session Equipment Utilized During Treatment: Gait belt Activity Tolerance: Patient tolerated treatment well Patient left: in chair;with chair alarm set;with call bell/phone within reach;with family/visitor present Nurse Communication: Mobility status;Patient requests pain meds PT Visit Diagnosis: Other abnormalities of gait and mobility  (R26.89);Difficulty in walking, not elsewhere classified (R26.2)     Time: 0459-9774 PT Time Calculation (min) (ACUTE ONLY): 45 min  Charges:  $Gait Training: 8-22 mins $Therapeutic Exercise: 8-22 mins $Therapeutic Activity: 8-22 mins                     Rica Koyanagi  PTA Acute  Rehabilitation Services Pager      [phone] Office      [phone]

## 2018-05-04 NOTE — Care Management Note (Addendum)
Case Management Note  Patient Details  Name: Jay Holmes MRN: [id number] Date of Birth: 12-02-1955  Subjective/Objective:                  discharged  Action/Plan: Discharged to home with self-care, orders checked for hhc needs. No CM needs present at time of discharge.  Patient is able to arrangement own appointments and home care. Patient will be doing outpt P.T. at the [facility]. Expected Discharge Date:  05/04/18               Expected Discharge Plan:  Home/Self Care  In-House Referral:     Discharge planning Services  CM Consult  Post Acute Care Choice:    Choice offered to:     DME Arranged:    DME Agency:     HH Arranged:    HH Agency:     Status of Service:  Completed, signed off  If discussed at [employer] of Stay Meetings, dates discussed:    Additional Comments:  Leeroy Cha, RN 05/04/2018, 8:56 AM

## 2018-05-04 NOTE — Progress Notes (Signed)
Physical Therapy Treatment Patient Details Name: Jay Holmes MRN: [id number] DOB: 11-Feb-1956 Today's Date: 05/04/2018    History of Present Illness 62 yo male s/p R TKR 05/03/18. PMH includes LBP, obesity, glaucoma, cataracts.     PT Comments    POD # 1 pm session Assisted with amb a greater distance, practiced stairs then performed all seated TKR TE's following HEP handout.  Addressed all mobility questions.  Pt ready for D/C to home.   Follow Up Recommendations  Follow surgeon's recommendation for DC plan and follow-up therapies;Supervision for mobility/OOB(OP)     Equipment Recommendations  None recommended by PT    Recommendations for Other Services       Precautions / Restrictions Precautions Precautions: Fall Precaution Comments: pt able to perfrom SLR Restrictions Weight Bearing Restrictions: No Other Position/Activity Restrictions: WBAT     Mobility  Bed Mobility Overal bed mobility: Needs Assistance Bed Mobility: Supine to Sit     Supine to sit: Modified independent (Device/Increase time)     General bed mobility comments: OOB in recliner   Transfers Overall transfer level: Needs assistance Equipment used: Rolling walker (2 wheeled) Transfers: Sit to/from [facility] Sit to Stand: Min guard;From elevated surface         General transfer comment: Min guard for safety. Verbal cuing for hand placement.   Ambulation/Gait Ambulation/Gait assistance: Min guard;Supervision Gait Distance (Feet): 110 Feet Assistive device: Rolling walker (2 wheeled) Gait Pattern/deviations: Step-to pattern;Decreased stance time - right;Decreased weight shift to right;Antalgic Gait velocity: decr    General Gait Details: one VC safety with turns    Stairs Stairs: Yes Stairs assistance: Supervision;Min guard Stair Management: Two rails;Forwards Number of Stairs: 2 General stair comments: with spouse "hands on" assist and 25% VC's on proper tech  and safety   Wheelchair Mobility    Modified Rankin (Stroke Patients Only)       Balance                                            Cognition Arousal/Alertness: Awake/alert Behavior During Therapy: WFL for tasks assessed/performed Overall Cognitive Status: Within Functional Limits for tasks assessed                                        Exercises      General Comments        Pertinent Vitals/Pain Pain Assessment: 0-10 Pain Score: 4  Pain Location: R knee, with mobility  Pain Descriptors / Indicators: Sore;Tightness;Operative site guarding Pain Intervention(s): Monitored during session;Repositioned;Ice applied    Home Living                      Prior Function            PT Goals (current goals can now be found in the care plan section) Progress towards PT goals: Progressing toward goals    Frequency    7X/week      PT Plan Current plan remains appropriate    Co-evaluation              AM-PAC PT "6 Clicks" Mobility   Outcome Measure  Help needed turning from your back to your side while in a flat bed without using bedrails?: A Little Help needed moving from  lying on your back to sitting on the side of a flat bed without using bedrails?: A Little Help needed moving to and from a bed to a chair (including a wheelchair)?: A Little Help needed standing up from a chair using your arms (e.g., wheelchair or bedside chair)?: A Little Help needed to walk in hospital room?: A Little Help needed climbing 3-5 steps with a railing? : A Lot 6 Click Score: 17    End of Session Equipment Utilized During Treatment: Gait belt Activity Tolerance: Patient tolerated treatment well Patient left: in chair;with chair alarm set;with call bell/phone within reach;with family/visitor present Nurse Communication: Mobility status;Patient requests pain meds PT Visit Diagnosis: Other abnormalities of gait and mobility  (R26.89);Difficulty in walking, not elsewhere classified (R26.2)     Time: 7948-0165 PT Time Calculation (min) (ACUTE ONLY): 25 min  Charges:  $Gait Training: 8-22 mins $Therapeutic Activity: 8-22 mins                     Rica Koyanagi  PTA Acute  Rehabilitation Services Pager      [phone] Office      [phone]

## 2018-05-04 NOTE — Plan of Care (Signed)
Plan of care reviewed and discussed with patient and his wife.  Deny questions at this time.

## 2018-05-04 NOTE — Progress Notes (Signed)
   Subjective: 1 Day Post-Op Procedure(s) (LRB): TOTAL KNEE ARTHROPLASTY (Right) Patient reports pain as mild.   Patient seen in rounds with Dr. Wynelle Link. Patient is well, and has had no acute complaints or problems. No SOB or chest pain. Foley to be removed this morning. No issues overnight.  Plan is to go Home after hospital stay.  Objective: Vital signs in last 24 hours: Temp:  [97.4 F (36.3 C)-98.3 F (36.8 C)] 98.1 F (36.7 C) (12/24 0617) Pulse Rate:  [65-101] 80 (12/24 0617) Resp:  [10-22] 17 (12/24 0617) BP: (95-121)/(64-92) 104/67 (12/24 0617) SpO2:  [90 %-100 %] 97 % (12/24 0617) Weight:  [92.1 kg] 92.1 kg (12/23 1651)  Intake/Output from previous day:  Intake/Output Summary (Last 24 hours) at 05/04/2018 0716 Last data filed at 05/04/2018 0600 Gross per 24 hour  Intake 2929.13 ml  Output 2145 ml  Net 784.13 ml     Labs: Recent Labs    05/04/18 0519  HGB 12.9*   Recent Labs    05/04/18 0519  WBC 12.0*  RBC 4.17*  HCT 39.3  PLT 170   Recent Labs    05/04/18 0519  NA 135  K 4.0  CL 106  CO2 21*  BUN 15  CREATININE 1.00  GLUCOSE 153*  CALCIUM 8.0*    EXAM General - Patient is Alert and Oriented Extremity - Neurologically intact Intact pulses distally Dorsiflexion/Plantar flexion intact No cellulitis present Compartment soft Dressing - dressing C/D/I Motor Function - intact, moving foot and toes well on exam.  Hemovac pulled without difficulty.  Past Medical History:  Diagnosis Date  . Arthritis   . Diastasis recti 07/21/2012   questionable umbilical hernia  . Glaucoma   . Personal history of diseases of skin and subcutaneous tissue   . Personal history of urinary calculi   . Unspecified hypothyroidism   . Unspecified sinusitis (chronic)     Assessment/Plan: 1 Day Post-Op Procedure(s) (LRB): TOTAL KNEE ARTHROPLASTY (Right) Principal Problem:   OA (osteoarthritis) of knee  Estimated body mass index is 29.55 kg/m as calculated  from the following:   Height as of this encounter: 5' 9.5" (1.765 m).   Weight as of this encounter: 92.1 kg. Advance diet Up with therapy D/C IV fluids when tolerating POs well  Anticipated LOS equal to or greater than 2 midnights due to - Age 63 and older with one or more of the following:  - Obesity  - Expected need for hospital services (PT, OT, Nursing) required for safe  discharge  - Anticipated need for postoperative skilled nursing care or inpatient rehab  - Active co-morbidities: None OR   - Unanticipated findings during/Post Surgery: None  - Patient is a high risk of re-admission due to: None   DVT Prophylaxis - Aspirin Weight-Bearing as tolerated D/C O2 and Pulse OX and try on Room Air  Continue PT today for two sessions. DC home this afternoon pending progress. Follow up in 2 weeks.   Ardeen Jourdain, PA-C Orthopaedic Surgery 05/04/2018, 7:16 AM

## 2018-05-06 ENCOUNTER — Encounter (HOSPITAL_COMMUNITY): Payer: Self-pay | Admitting: Orthopedic Surgery

## 2018-05-07 DIAGNOSIS — M25561 Pain in right knee: Secondary | ICD-10-CM | POA: Diagnosis not present

## 2018-05-10 DIAGNOSIS — M25561 Pain in right knee: Secondary | ICD-10-CM | POA: Diagnosis not present

## 2018-05-11 DIAGNOSIS — M25561 Pain in right knee: Secondary | ICD-10-CM | POA: Diagnosis not present

## 2018-05-14 DIAGNOSIS — M25561 Pain in right knee: Secondary | ICD-10-CM | POA: Diagnosis not present

## 2018-05-15 NOTE — Discharge Summary (Signed)
Physician Discharge Summary   Patient ID: Jay Holmes MRN: [id number] DOB/AGE: 1955/10/03 63 y.o.  Admit date: 05/03/2018 Discharge date: 05/04/2018  Primary Diagnosis: Osteoarthritis, right knee   Admission Diagnoses:  Past Medical History:  Diagnosis Date  . Arthritis   . Diastasis recti 07/21/2012   questionable umbilical hernia  . Glaucoma   . Personal history of diseases of skin and subcutaneous tissue   . Personal history of urinary calculi   . Unspecified hypothyroidism   . Unspecified sinusitis (chronic)    Discharge Diagnoses:   Principal Problem:   OA (osteoarthritis) of knee  Estimated body mass index is 29.55 kg/m as calculated from the following:   Height as of this encounter: 5' 9.5" (1.765 m).   Weight as of this encounter: 92.1 kg.  Procedure:  Procedure(s) (LRB): TOTAL KNEE ARTHROPLASTY (Right)   Consults: None  HPI: Jay Holmes is a 63 y.o. year old male with end stage OA of his right knee with progressively worsening pain and dysfunction. He has constant pain, with activity and at rest and significant functional deficits with difficulties even with ADLs. He has had extensive non-op management including analgesics, injections of cortisone and viscosupplements, and home exercise program, but remains in significant pain with significant dysfunction. Radiographs show bone on bone arthritis medial and patellofemoral. He presents now for right Total Knee Arthroplasty.    Laboratory Data: Admission on 05/03/2018, Discharged on 05/04/2018  Component Date Value Ref Range Status  . WBC 05/04/2018 12.0* 4.0 - 10.5 K/uL Final  . RBC 05/04/2018 4.17* 4.22 - 5.81 MIL/uL Final  . Hemoglobin 05/04/2018 12.9* 13.0 - 17.0 g/dL Final  . HCT 05/04/2018 39.3  39.0 - 52.0 % Final  . MCV 05/04/2018 94.2  80.0 - 100.0 fL Final  . MCH 05/04/2018 30.9  26.0 - 34.0 pg Final  . MCHC 05/04/2018 32.8  30.0 - 36.0 g/dL Final  . RDW 05/04/2018 13.0  11.5 - 15.5 % Final  .  Platelets 05/04/2018 170  150 - 400 K/uL Final  . nRBC 05/04/2018 0.0  0.0 - 0.2 % Final   Performed at [facility], [place] [address], [place], [place] [address]  . Sodium 05/04/2018 135  135 - 145 mmol/L Final  . Potassium 05/04/2018 4.0  3.5 - 5.1 mmol/L Final  . Chloride 05/04/2018 106  98 - 111 mmol/L Final  . CO2 05/04/2018 21* 22 - 32 mmol/L Final  . Glucose, Bld 05/04/2018 153* 70 - 99 mg/dL Final  . BUN 05/04/2018 15  8 - 23 mg/dL Final  . Creatinine, Ser 05/04/2018 1.00  0.61 - 1.24 mg/dL Final  . Calcium 05/04/2018 8.0* 8.9 - 10.3 mg/dL Final  . GFR calc non Af Amer 05/04/2018 >60  >60 mL/min Final  . GFR calc Af Amer 05/04/2018 >60  >60 mL/min Final  . Anion gap 05/04/2018 8  5 - 15 Final   Performed at [facility], [place] [address], [place], [place] [address]  Hospital Outpatient Visit on 04/28/2018  Component Date Value Ref Range Status  . aPTT 04/28/2018 38* 24 - 36 seconds Final   Comment:        IF BASELINE aPTT IS ELEVATED, SUGGEST PATIENT RISK ASSESSMENT BE USED TO DETERMINE APPROPRIATE ANTICOAGULANT THERAPY. Performed at [facility], [place] [address] [place],  [address]   . WBC 04/28/2018 5.7  4.0 - 10.5 K/uL Final  . RBC 04/28/2018 5.07  4.22 - 5.81 MIL/uL Final  .  Hemoglobin 04/28/2018 15.8  13.0 - 17.0 g/dL Final  . HCT 04/28/2018 48.0  39.0 - 52.0 % Final  . MCV 04/28/2018 94.7  80.0 - 100.0 fL Final  . MCH 04/28/2018 31.2  26.0 - 34.0 pg Final  . MCHC 04/28/2018 32.9  30.0 - 36.0 g/dL Final  . RDW 04/28/2018 13.4  11.5 - 15.5 % Final  . Platelets 04/28/2018 197  150 - 400 K/uL Final  . nRBC 04/28/2018 0.0  0.0 - 0.2 % Final   Performed at [facility], [place] [address] [place], [place] [address]  . Sodium 04/28/2018 140  135 - 145 mmol/L Final  . Potassium 04/28/2018 4.7  3.5 - 5.1 mmol/L Final  . Chloride 04/28/2018 105  98 - 111 mmol/L Final  . CO2 04/28/2018 27  22 - 32  mmol/L Final  . Glucose, Bld 04/28/2018 113* 70 - 99 mg/dL Final  . BUN 04/28/2018 21  8 - 23 mg/dL Final  . Creatinine, Ser 04/28/2018 1.11  0.61 - 1.24 mg/dL Final  . Calcium 04/28/2018 9.1  8.9 - 10.3 mg/dL Final  . Total Protein 04/28/2018 7.5  6.5 - 8.1 g/dL Final  . Albumin 04/28/2018 4.3  3.5 - 5.0 g/dL Final  . AST 04/28/2018 24  15 - 41 U/L Final  . ALT 04/28/2018 31  0 - 44 U/L Final  . Alkaline Phosphatase 04/28/2018 103  38 - 126 U/L Final  . Total Bilirubin 04/28/2018 0.7  0.3 - 1.2 mg/dL Final  . GFR calc non Af Amer 04/28/2018 >60  >60 mL/min Final  . GFR calc Af Amer 04/28/2018 >60  >60 mL/min Final  . Anion gap 04/28/2018 8  5 - 15 Final   Performed at [facility], [place] [address] [place], [place] [address]  . Prothrombin Time 04/28/2018 12.5  11.4 - 15.2 seconds Final  . INR 04/28/2018 0.94   Final   Performed at [facility], [place] [address] [place], [place] [address]  . ABO/RH(D) 04/28/2018 O POS   Final  . Antibody Screen 04/28/2018 NEG   Final  . Sample Expiration 04/28/2018 05/06/2018   Final  . Extend sample reason 04/28/2018    Final                   Value:NO TRANSFUSIONS OR PREGNANCY IN THE PAST 3 MONTHS Performed at [facility] [place] [address], [place], [place] [address]   . MRSA, PCR 04/28/2018 NEGATIVE  NEGATIVE Final  . Staphylococcus aureus 04/28/2018 NEGATIVE  NEGATIVE Final   Comment: (NOTE) The Xpert SA Assay (FDA approved for NASAL specimens in patients 30 years of age and older), is one component of a comprehensive surveillance program. It is not intended to diagnose infection nor to guide or monitor treatment. Performed at [facility], [place] [address], [place], [place] [address]   . ABO/RH(D) 04/28/2018    Final                   Value:O POS Performed at [facility], [place] [address], [place], [place] [address]      X-Rays:No results  found.  EKG: Orders placed or performed in visit on 04/06/17  . EKG 12-Lead     Hospital Course: Jay Holmes is a 63 y.o. who was admitted to [facility]. They were brought to the operating room on 05/03/2018 and underwent Procedure(s): TOTAL KNEE ARTHROPLASTY.  Patient tolerated  the procedure well and was later transferred to the recovery room and then to the orthopaedic floor for postoperative care. They were given PO and IV analgesics for pain control following their surgery. They were given 24 hours of postoperative antibiotics of  Anti-infectives (From admission, onward)   Start     Dose/Rate Route Frequency Ordered Stop   05/03/18 1730  ceFAZolin (ANCEF) IVPB 2g/100 mL premix     2 g 200 mL/hr over 30 Minutes Intravenous Every 6 hours 05/03/18 1436 05/03/18 2356   05/03/18 0915  ceFAZolin (ANCEF) IVPB 2g/100 mL premix     2 g 200 mL/hr over 30 Minutes Intravenous On call to O.R. 05/03/18 0908 05/03/18 1156     and started on DVT prophylaxis in the form of Aspirin.   PT and OT were ordered for total joint protocol. Discharge planning consulted to help with postop disposition and equipment needs.  Patient had a good night on the evening of surgery. They started to get up OOB with therapy on POD #0. Pt was seen during rounds and was ready to go home pending progress with therapy. Hemovac drain was pulled without difficulty. He worked with therapy on POD #1 and was meeting his goals. Pt was discharged to home later that day in stable condition.  Diet: Regular diet Activity: WBAT Follow-up: in 2 weeks with Dr. Wynelle Link Disposition: Home with outpatient physical therapy at [facility] Discharged Condition: stable   Discharge Instructions    Call MD / Call 911   Complete by:  As directed    If you experience chest pain or shortness of breath, CALL 911 and be transported to the hospital emergency room.  If you develope a fever above 101 F, pus (white drainage) or increased drainage or  redness at the wound, or calf pain, call your surgeon's office.   Change dressing   Complete by:  As directed    Change dressing on Wednesday, then change the dressing daily with sterile 4 x 4 inch gauze dressing and apply TED hose.   Constipation Prevention   Complete by:  As directed    Drink plenty of fluids.  Prune juice may be helpful.  You may use a stool softener, such as Colace (over the counter) 100 mg twice a day.  Use MiraLax (over the counter) for constipation as needed.   Diet - low sodium heart healthy   Complete by:  As directed    Discharge instructions   Complete by:  As directed    Dr. Gaynelle Arabian Total Joint Specialist [facility] [address] [place], [place] [address] [phone]  TOTAL KNEE REPLACEMENT POSTOPERATIVE DIRECTIONS  Knee Rehabilitation, Guidelines Following Surgery  Results after knee surgery are often greatly improved when you follow the exercise, range of motion and muscle strengthening exercises prescribed by your doctor. Safety measures are also important to protect the knee from further injury. Any time any of these exercises cause you to have increased pain or swelling in your knee joint, decrease the amount until you are comfortable again and slowly increase them. If you have problems or questions, call your caregiver or physical therapist for advice.   HOME CARE INSTRUCTIONS  Remove items at home which could result in a fall. This includes throw rugs or furniture in walking pathways.  ICE to the affected knee every three hours for 30 minutes at a time and then as needed for pain and swelling.  Continue to use ice on the knee for  pain and swelling from surgery. You may notice swelling that will progress down to the foot and ankle.  This is normal after surgery.  Elevate the leg when you are not up walking on it.   Continue to use the breathing machine which will help keep your temperature down.  It is common for your temperature to  cycle up and down following surgery, especially at night when you are not up moving around and exerting yourself.  The breathing machine keeps your lungs expanded and your temperature down. Do not place pillow under knee, focus on keeping the knee straight while resting   DIET You may resume your previous home diet once your are discharged from the hospital.  DRESSING / WOUND CARE / SHOWERING You may shower 3 days after surgery, but keep the wounds dry during showering.  You may use an occlusive plastic wrap (Press'n Seal for example), NO SOAKING/SUBMERGING IN THE BATHTUB.  If the bandage gets wet, change with a clean dry gauze.  If the incision gets wet, pat the wound dry with a clean towel. You may start showering once you are discharged home but do not submerge the incision under water. Just pat the incision dry and apply a dry gauze dressing on daily. Change the surgical dressing daily and reapply a dry dressing each time.  ACTIVITY Walk with your walker as instructed. Use walker as long as suggested by your caregivers. Avoid periods of inactivity such as sitting longer than an hour when not asleep. This helps prevent blood clots.  You may resume a sexual relationship in one month or when given the OK by your doctor.  You may return to work once you are cleared by your doctor.  Do not drive a car for 6 weeks or until released by you surgeon.  Do not drive while taking narcotics.  WEIGHT BEARING Weight bearing as tolerated with assist device (walker, cane, etc) as directed, use it as long as suggested by your surgeon or therapist, typically at least 4-6 weeks.  POSTOPERATIVE CONSTIPATION PROTOCOL Constipation - defined medically as fewer than three stools per week and severe constipation as less than one stool per week.  One of the most common issues patients have following surgery is constipation.  Even if you have a regular bowel pattern at home, your normal regimen is likely to be  disrupted due to multiple reasons following surgery.  Combination of anesthesia, postoperative narcotics, change in appetite and fluid intake all can affect your bowels.  In order to avoid complications following surgery, here are some recommendations in order to help you during your recovery period.  Colace (docusate) - Pick up an over-the-counter form of Colace or another stool softener and take twice a day as long as you are requiring postoperative pain medications.  Take with a full glass of water daily.  If you experience loose stools or diarrhea, hold the colace until you stool forms back up.  If your symptoms do not get better within 1 week or if they get worse, check with your doctor.  Dulcolax (bisacodyl) - Pick up over-the-counter and take as directed by the product packaging as needed to assist with the movement of your bowels.  Take with a full glass of water.  Use this product as needed if not relieved by Colace only.   MiraLax (polyethylene glycol) - Pick up over-the-counter to have on hand.  MiraLax is a solution that will increase the amount of water in your bowels to assist  with bowel movements.  Take as directed and can mix with a glass of water, juice, soda, coffee, or tea.  Take if you go more than two days without a movement. Do not use MiraLax more than once per day. Call your doctor if you are still constipated or irregular after using this medication for 7 days in a row.  If you continue to have problems with postoperative constipation, please contact the office for further assistance and recommendations.  If you experience "the worst abdominal pain ever" or develop nausea or vomiting, please contact the office immediatly for further recommendations for treatment.  ITCHING  If you experience itching with your medications, try taking only a single pain pill, or even half a pain pill at a time.  You can also use Benadryl over the counter for itching or also to help with sleep.    TED HOSE STOCKINGS Wear the elastic stockings on both legs for three weeks following surgery during the day but you may remove then at night for sleeping.  MEDICATIONS See your medication summary on the "After Visit Summary" that the nursing staff will review with you prior to discharge.  You may have some home medications which will be placed on hold until you complete the course of blood thinner medication.  It is important for you to complete the blood thinner medication as prescribed by your surgeon.  Continue your approved medications as instructed at time of discharge.  PRECAUTIONS If you experience chest pain or shortness of breath - call 911 immediately for transfer to the hospital emergency department.  If you develop a fever greater that 101 F, purulent drainage from wound, increased redness or drainage from wound, foul odor from the wound/dressing, or calf pain - CONTACT YOUR SURGEON.                                                   FOLLOW-UP APPOINTMENTS Make sure you keep all of your appointments after your operation with your surgeon and caregivers. You should call the office at the above phone number and make an appointment for approximately two weeks after the date of your surgery or on the date instructed by your surgeon outlined in the "After Visit Summary".   RANGE OF MOTION AND STRENGTHENING EXERCISES  Rehabilitation of the knee is important following a knee injury or an operation. After just a few days of immobilization, the muscles of the thigh which control the knee become weakened and shrink (atrophy). Knee exercises are designed to build up the tone and strength of the thigh muscles and to improve knee motion. Often times heat used for twenty to thirty minutes before working out will loosen up your tissues and help with improving the range of motion but do not use heat for the first two weeks following surgery. These exercises can be done on a training (exercise) mat, on  the floor, on a table or on a bed. Use what ever works the best and is most comfortable for you Knee exercises include:  Leg Lifts - While your knee is still immobilized in a splint or cast, you can do straight leg raises. Lift the leg to 60 degrees, hold for 3 sec, and slowly lower the leg. Repeat 10-20 times 2-3 times daily. Perform this exercise against resistance later as your knee gets better.  [employer]  and Hamstring Sets - Tighten up the muscle on the front of the thigh (Quad) and hold for 5-10 sec. Repeat this 10-20 times hourly. Hamstring sets are done by pushing the foot backward against an object and holding for 5-10 sec. Repeat as with quad sets.  Leg Slides: Lying on your back, slowly slide your foot toward your buttocks, bending your knee up off the floor (only go as far as is comfortable). Then slowly slide your foot back down until your leg is flat on the floor again. Angel Wings: Lying on your back spread your legs to the side as far apart as you can without causing discomfort.  A rehabilitation program following serious knee injuries can speed recovery and prevent re-injury in the future due to weakened muscles. Contact your doctor or a physical therapist for more information on knee rehabilitation.   IF YOU ARE TRANSFERRED TO A SKILLED REHAB FACILITY If the patient is transferred to a skilled rehab facility following release from the hospital, a list of the current medications will be sent to the facility for the patient to continue.  When discharged from the skilled rehab facility, please have the facility set up the patient's [place] prior to being released. Also, the skilled facility will be responsible for providing the patient with their medications at time of release from the facility to include their pain medication, the muscle relaxants, and their blood thinner medication. If the patient is still at the rehab facility at time of the two week follow up appointment,  the skilled rehab facility will also need to assist the patient in arranging follow up appointment in our office and any transportation needs.  MAKE SURE YOU:  Understand these instructions.  Get help right away if you are not doing well or get worse.    Pick up stool softner and laxative for home use following surgery while on pain medications. Do not submerge incision under water. Please use good hand washing techniques while changing dressing each day. May shower starting three days after surgery. Please use a clean towel to pat the incision dry following showers. Continue to use ice for pain and swelling after surgery. Do not use any lotions or creams on the incision until instructed by your surgeon.   Do not put a pillow under the knee. Place it under the heel.   Complete by:  As directed    Driving restrictions   Complete by:  As directed    No driving for two weeks   TED hose   Complete by:  As directed    Use stockings (TED hose) for three weeks on both leg(s).  You may remove them at night for sleeping.   Weight bearing as tolerated   Complete by:  As directed      Allergies as of 05/04/2018   No Known Allergies     Medication List    TAKE these medications   aspirin 325 MG EC tablet Take 1 tablet (325 mg total) by mouth 2 (two) times daily for 21 days. Take one tablet (325 mg) Aspirin two times a day for three weeks following surgery. Then take one baby Aspirin (81 mg) once a day for three weeks. Then discontinue aspirin.   bimatoprost 0.01 % Soln Commonly known as:  LUMIGAN Place 1 drop into both eyes at bedtime.   calcium gluconate 500 MG tablet Take 1 tablet by mouth daily.   COMBIGAN OP Apply 1 drop to eye 2 (two)  times daily.   gabapentin 300 MG capsule Commonly known as:  NEURONTIN Take 1 capsule (300 mg total) by mouth 3 (three) times daily. Gabapentin 300 mg Protocol Take a 300 mg capsule three times a day for two weeks following surgery. Then  take a 300 mg capsule two times a day for two weeks.  Then take a 300 mg capsule once a day for two weeks.  Then discontinue the Gabapentin.   GLUCOSAMINE-CHONDROITIN PO Take 1,200 mg by mouth daily.   ketoconazole 2 % cream Commonly known as:  NIZORAL Apply 1 application topically daily as needed (rosacea).   levothyroxine 100 MCG tablet Commonly known as:  SYNTHROID, LEVOTHROID Take 1 tablet (100 mcg total) by mouth daily. What changed:  Another medication with the same name was changed. Make sure you understand how and when to take each.   levothyroxine 100 MCG tablet Commonly known as:  SYNTHROID, LEVOTHROID TAKE 1 TABLET BY MOUTH  DAILY What changed:  when to take this   methocarbamol 500 MG tablet Commonly known as:  ROBAXIN Take 1 tablet (500 mg total) by mouth every 6 (six) hours as needed for muscle spasms.   omeprazole 40 MG capsule Commonly known as:  PRILOSEC TAKE 1 CAPSULE BY MOUTH  DAILY   omeprazole 40 MG capsule Commonly known as:  PRILOSEC TAKE ONE CAPSULE BY MOUTH DAILY   oxyCODONE 5 MG immediate release tablet Commonly known as:  Oxy IR/ROXICODONE Take 1-2 tablets (5-10 mg total) by mouth every 6 (six) hours as needed for moderate pain (pain score 4-6).            Discharge Care Instructions  (From admission, onward)         Start     Ordered   05/03/18 0000  Weight bearing as tolerated     05/03/18 1453   05/03/18 0000  Change dressing    Comments:  Change dressing on Wednesday, then change the dressing daily with sterile 4 x 4 inch gauze dressing and apply TED hose.   05/03/18 1453         Follow-up Information    Gaynelle Arabian, MD. Schedule an appointment as soon as possible for a visit on 05/18/2018.   Specialty:  Orthopedic Surgery Contact information: [address] [place] [address] [phone]           Signed: Theresa Duty, PA-C Orthopedic Surgery 05/15/2018, 6:00 PM

## 2018-05-17 DIAGNOSIS — M25561 Pain in right knee: Secondary | ICD-10-CM | POA: Diagnosis not present

## 2018-05-17 DIAGNOSIS — Z96651 Presence of right artificial knee joint: Secondary | ICD-10-CM | POA: Insufficient documentation

## 2018-05-19 DIAGNOSIS — M25561 Pain in right knee: Secondary | ICD-10-CM | POA: Diagnosis not present

## 2018-05-21 DIAGNOSIS — M25561 Pain in right knee: Secondary | ICD-10-CM | POA: Diagnosis not present

## 2018-05-24 DIAGNOSIS — M25561 Pain in right knee: Secondary | ICD-10-CM | POA: Diagnosis not present

## 2018-05-26 DIAGNOSIS — M25561 Pain in right knee: Secondary | ICD-10-CM | POA: Diagnosis not present

## 2018-05-28 DIAGNOSIS — M25561 Pain in right knee: Secondary | ICD-10-CM | POA: Diagnosis not present

## 2018-05-31 DIAGNOSIS — M25561 Pain in right knee: Secondary | ICD-10-CM | POA: Diagnosis not present

## 2018-06-02 DIAGNOSIS — M25561 Pain in right knee: Secondary | ICD-10-CM | POA: Diagnosis not present

## 2018-06-04 DIAGNOSIS — M25561 Pain in right knee: Secondary | ICD-10-CM | POA: Diagnosis not present

## 2018-06-07 DIAGNOSIS — M25561 Pain in right knee: Secondary | ICD-10-CM | POA: Diagnosis not present

## 2018-06-08 DIAGNOSIS — Z471 Aftercare following joint replacement surgery: Secondary | ICD-10-CM | POA: Diagnosis not present

## 2018-06-09 DIAGNOSIS — M25561 Pain in right knee: Secondary | ICD-10-CM | POA: Diagnosis not present

## 2018-06-11 DIAGNOSIS — M25561 Pain in right knee: Secondary | ICD-10-CM | POA: Diagnosis not present

## 2018-06-14 DIAGNOSIS — M25561 Pain in right knee: Secondary | ICD-10-CM | POA: Diagnosis not present

## 2018-06-16 DIAGNOSIS — M25561 Pain in right knee: Secondary | ICD-10-CM | POA: Diagnosis not present

## 2018-06-18 DIAGNOSIS — M25561 Pain in right knee: Secondary | ICD-10-CM | POA: Diagnosis not present

## 2018-07-12 ENCOUNTER — Other Ambulatory Visit (INDEPENDENT_AMBULATORY_CARE_PROVIDER_SITE_OTHER): Payer: 59

## 2018-07-12 ENCOUNTER — Ambulatory Visit (INDEPENDENT_AMBULATORY_CARE_PROVIDER_SITE_OTHER): Payer: 59 | Admitting: Internal Medicine

## 2018-07-12 ENCOUNTER — Encounter: Payer: Self-pay | Admitting: Internal Medicine

## 2018-07-12 VITALS — BP 130/84 | HR 77 | Temp 97.6°F | Ht 69.5 in | Wt 198.8 lb

## 2018-07-12 DIAGNOSIS — K219 Gastro-esophageal reflux disease without esophagitis: Secondary | ICD-10-CM

## 2018-07-12 DIAGNOSIS — E039 Hypothyroidism, unspecified: Secondary | ICD-10-CM

## 2018-07-12 DIAGNOSIS — Z0000 Encounter for general adult medical examination without abnormal findings: Secondary | ICD-10-CM | POA: Diagnosis not present

## 2018-07-12 DIAGNOSIS — R739 Hyperglycemia, unspecified: Secondary | ICD-10-CM | POA: Diagnosis not present

## 2018-07-12 LAB — CBC WITH DIFFERENTIAL/PLATELET
BASOS ABS: 0.1 10*3/uL (ref 0.0–0.1)
Basophils Relative: 1.4 % (ref 0.0–3.0)
EOS ABS: 0.3 10*3/uL (ref 0.0–0.7)
Eosinophils Relative: 4.8 % (ref 0.0–5.0)
HCT: 46.3 % (ref 39.0–52.0)
Hemoglobin: 15.8 g/dL (ref 13.0–17.0)
Lymphocytes Relative: 34.1 % (ref 12.0–46.0)
Lymphs Abs: 1.9 10*3/uL (ref 0.7–4.0)
MCHC: 34.1 g/dL (ref 30.0–36.0)
MCV: 91.3 fl (ref 78.0–100.0)
Monocytes Absolute: 0.5 10*3/uL (ref 0.1–1.0)
Monocytes Relative: 10 % (ref 3.0–12.0)
Neutro Abs: 2.7 10*3/uL (ref 1.4–7.7)
Neutrophils Relative %: 49.7 % (ref 43.0–77.0)
Platelets: 220 10*3/uL (ref 150.0–400.0)
RBC: 5.06 Mil/uL (ref 4.22–5.81)
RDW: 13.6 % (ref 11.5–15.5)
WBC: 5.5 10*3/uL (ref 4.0–10.5)

## 2018-07-12 LAB — COMPREHENSIVE METABOLIC PANEL
ALT: 27 U/L (ref 0–53)
AST: 19 U/L (ref 0–37)
Albumin: 4.5 g/dL (ref 3.5–5.2)
Alkaline Phosphatase: 120 U/L — ABNORMAL HIGH (ref 39–117)
BILIRUBIN TOTAL: 0.6 mg/dL (ref 0.2–1.2)
BUN: 26 mg/dL — AB (ref 6–23)
CO2: 27 mEq/L (ref 19–32)
Calcium: 9.5 mg/dL (ref 8.4–10.5)
Chloride: 103 mEq/L (ref 96–112)
Creatinine, Ser: 0.98 mg/dL (ref 0.40–1.50)
GFR: 77.04 mL/min (ref >60.00)
Glucose, Bld: 98 mg/dL (ref 70–99)
Potassium: 4.2 mEq/L (ref 3.5–5.1)
Sodium: 139 mEq/L (ref 135–145)
Total Protein: 7.1 g/dL (ref 6.0–8.3)

## 2018-07-12 LAB — PSA: PSA: 0.32 ng/mL (ref 0.10–4.00)

## 2018-07-12 LAB — LIPID PANEL
Cholesterol: 139 mg/dL (ref 0–200)
HDL: 43.3 mg/dL (ref >39.00)
LDL Cholesterol: 77 mg/dL (ref 0–99)
NonHDL: 95.72
Total CHOL/HDL Ratio: 3
Triglycerides: 92 mg/dL (ref 0.0–149.0)
VLDL: 18.4 mg/dL (ref 0.0–40.0)

## 2018-07-12 LAB — HEMOGLOBIN A1C: Hgb A1c MFr Bld: 5.8 % (ref 4.6–6.5)

## 2018-07-12 LAB — TSH: TSH: 3.74 u[IU]/mL (ref 0.35–4.50)

## 2018-07-12 NOTE — Progress Notes (Signed)
Subjective:  Patient ID: Jay Holmes, male    DOB: 1956-01-28  Age: 63 y.o. MRN: [id number]  CC: Anemia and Annual Exam   HPI FUQUAN WILSON presents for a CPX.  He works out on an exercise bike several times a week and tells me his endurance is good and he does not experience CP, DOE, or SOB.  He complains of weight gain but denies constipation, edema, fatigue, or weakness.  Outpatient Medications Prior to Visit  Medication Sig Dispense Refill  . bimatoprost (LUMIGAN) 0.01 % SOLN Place 1 drop into both eyes at bedtime.     . Brimonidine Tartrate-Timolol (COMBIGAN OP) Apply 1 drop to eye 2 (two) times daily.     . calcium gluconate 500 MG tablet Take 1 tablet by mouth daily.    [name] gabapentin (NEURONTIN) 300 MG capsule Take 1 capsule (300 mg total) by mouth 3 (three) times daily. Gabapentin 300 mg Protocol Take a 300 mg capsule three times a day for two weeks following surgery. Then take a 300 mg capsule two times a day for two weeks.  Then take a 300 mg capsule once a day for two weeks.  Then discontinue the Gabapentin. 84 capsule 0  . GLUCOSAMINE-CHONDROITIN PO Take 1,200 mg by mouth daily.    [name] ketoconazole (NIZORAL) 2 % cream Apply 1 application topically daily as needed (rosacea).    [name] levothyroxine (SYNTHROID, LEVOTHROID) 100 MCG tablet Take 1 tablet (100 mcg total) by mouth daily. 90 tablet 1  . omeprazole (PRILOSEC) 40 MG capsule TAKE ONE CAPSULE BY MOUTH DAILY (Patient taking differently: Take 40 mg by mouth daily. ) 90 capsule 1  . levothyroxine (SYNTHROID, LEVOTHROID) 100 MCG tablet TAKE 1 TABLET BY MOUTH  DAILY (Patient taking differently: Take 100 mcg by mouth daily before breakfast. ) 90 tablet 1  . methocarbamol (ROBAXIN) 500 MG tablet Take 1 tablet (500 mg total) by mouth every 6 (six) hours as needed for muscle spasms. 40 tablet 0  . omeprazole (PRILOSEC) 40 MG capsule TAKE 1 CAPSULE BY MOUTH  DAILY (Patient not taking: Reported on 04/22/2018) 90 capsule 1  . oxyCODONE  (OXY IR/ROXICODONE) 5 MG immediate release tablet Take 1-2 tablets (5-10 mg total) by mouth every 6 (six) hours as needed for moderate pain (pain score 4-6). 56 tablet 0   No facility-administered medications prior to visit.     ROS Review of Systems  Constitutional: Positive for unexpected weight change (wt gain). Negative for diaphoresis and fatigue.  HENT: Negative.   Eyes: Negative.   Respiratory: Negative for cough, chest tightness, shortness of breath and wheezing.   Cardiovascular: Negative for chest pain, palpitations and leg swelling.  Gastrointestinal: Negative for abdominal pain, blood in stool, constipation, diarrhea, nausea and vomiting.  Endocrine: Negative for cold intolerance and heat intolerance.  Genitourinary: Negative.  Negative for difficulty urinating, dysuria, penile pain, penile swelling, scrotal swelling, testicular pain and urgency.  Musculoskeletal: Negative.  Negative for arthralgias and myalgias.  Skin: Negative.  Negative for color change.  Neurological: Negative.  Negative for dizziness, weakness and headaches.  Hematological: Negative for adenopathy. Does not bruise/bleed easily.  Psychiatric/Behavioral: Negative.     Objective:  BP 130/84 (BP Location: Left Arm, Patient Position: Sitting, Cuff Size: Normal)   Pulse 77   Temp 97.6 F (36.4 C) (Oral)   Ht 5' 9.5" (1.765 m)   Wt 198 lb 12 oz (90.2 kg)   SpO2 99%   BMI 28.93 kg/m   BP Readings  from Last 3 Encounters:  07/12/18 130/84  05/04/18 107/77  04/28/18 101/87    Wt Readings from Last 3 Encounters:  07/12/18 198 lb 12 oz (90.2 kg)  05/03/18 203 lb (92.1 kg)  04/28/18 203 lb (92.1 kg)    Physical Exam Vitals signs reviewed.  Constitutional:      Appearance: He is not ill-appearing or diaphoretic.  HENT:     Nose: Nose normal. No congestion or rhinorrhea.     Mouth/Throat:     Mouth: Mucous membranes are moist.     Pharynx: Oropharynx is clear. No oropharyngeal exudate or  posterior oropharyngeal erythema.  Eyes:     General: No scleral icterus.    Conjunctiva/sclera: Conjunctivae normal.  Neck:     Musculoskeletal: Normal range of motion and neck supple. No muscular tenderness.  Cardiovascular:     Rate and Rhythm: Normal rate and regular rhythm.     Heart sounds: No murmur.  Pulmonary:     Effort: Pulmonary effort is normal. No respiratory distress.     Breath sounds: Normal breath sounds. No wheezing, rhonchi or rales.  Abdominal:     General: Bowel sounds are normal.     Palpations: There is no hepatomegaly or splenomegaly.     Tenderness: There is no abdominal tenderness.     Hernia: There is no hernia in the right inguinal area or left inguinal area.  Genitourinary:    Pubic Area: No rash.      Penis: Normal. No discharge, swelling or lesions.      Scrotum/Testes: Normal.        Right: Mass or tenderness not present.        Left: Mass or tenderness not present.     Epididymis:     Right: Normal. Not enlarged. No mass.     Left: Normal. Not enlarged. No mass.     Prostate: Enlarged (1+ smooth symm BPH). Not tender and no nodules present.     Rectum: Normal. Guaiac result negative. No mass, tenderness, anal fissure, external hemorrhoid or internal hemorrhoid. Normal anal tone.  Musculoskeletal: Normal range of motion.        General: No swelling.     Right lower leg: No edema.  Lymphadenopathy:     Cervical: No cervical adenopathy.     Lower Body: No right inguinal adenopathy. No left inguinal adenopathy.  Skin:    General: Skin is warm.     Findings: No rash.  Neurological:     General: No focal deficit present.     Mental Status: He is oriented to person, place, and time. Mental status is at baseline.  Psychiatric:        Mood and Affect: Mood normal.        Behavior: Behavior normal.        Thought Content: Thought content normal.        Judgment: Judgment normal.     Lab Results  Component Value Date   WBC 5.5 07/12/2018   HGB  15.8 07/12/2018   HCT 46.3 07/12/2018   PLT 220.0 07/12/2018   GLUCOSE 98 07/12/2018   CHOL 139 07/12/2018   TRIG 92.0 07/12/2018   HDL 43.30 07/12/2018   LDLCALC 77 07/12/2018   ALT 27 07/12/2018   AST 19 07/12/2018   NA 139 07/12/2018   K 4.2 07/12/2018   CL 103 07/12/2018   CREATININE 0.98 07/12/2018   BUN 26 (H) 07/12/2018   CO2 27 07/12/2018   TSH 3.74 07/12/2018  PSA 0.32 07/12/2018   INR 0.94 04/28/2018   HGBA1C 5.8 07/12/2018    No results found.  Assessment & Plan:   Gustavo was seen today for anemia and annual exam.  Diagnoses and all orders for this visit:  Acquired hypothyroidism- His TSH is in the normal range and clinically he appears euthyroid.  He will remain on the current dose of levothyroxine. -     TSH; Future -     Comprehensive metabolic panel; Future  Healthcare maintenance- Exam completed, labs reviewed, vaccines reviewed and updated, screening for colon cancer is up-to-date, patient education material was given. -     Lipid panel; Future -     PSA; Future  Gastroesophageal reflux disease without esophagitis- His symptoms are well controlled with the PPI.  No complications noted. -     CBC with Differential/Platelet; Future  Hyperglycemia- His A1c is very mildly elevated at 5.8%.  Medical therapy is not indicated.  He was encouraged to improve his lifestyle modifications. -     Comprehensive metabolic panel; Future -     Hemoglobin A1c; Future   I have discontinued Simona Huh T. Minnich's oxyCODONE and methocarbamol. I am also having him maintain his bimatoprost, Brimonidine Tartrate-Timolol (COMBIGAN OP), levothyroxine, omeprazole, calcium gluconate, GLUCOSAMINE-CHONDROITIN PO, ketoconazole, and gabapentin.  No orders of the defined types were placed in this encounter.    Follow-up: Return in about 4 months (around 11/11/2018).  Scarlette Calico, MD

## 2018-07-12 NOTE — Patient Instructions (Signed)

## 2018-07-21 DIAGNOSIS — H31001 Unspecified chorioretinal scars, right eye: Secondary | ICD-10-CM | POA: Diagnosis not present

## 2018-07-21 DIAGNOSIS — Z961 Presence of intraocular lens: Secondary | ICD-10-CM | POA: Diagnosis not present

## 2018-07-21 DIAGNOSIS — H401133 Primary open-angle glaucoma, bilateral, severe stage: Secondary | ICD-10-CM | POA: Diagnosis not present

## 2018-07-21 DIAGNOSIS — H43813 Vitreous degeneration, bilateral: Secondary | ICD-10-CM | POA: Diagnosis not present

## 2018-09-02 ENCOUNTER — Other Ambulatory Visit: Payer: Self-pay | Admitting: Internal Medicine

## 2018-09-02 DIAGNOSIS — K219 Gastro-esophageal reflux disease without esophagitis: Secondary | ICD-10-CM

## 2018-09-22 DIAGNOSIS — C4441 Basal cell carcinoma of skin of scalp and neck: Secondary | ICD-10-CM | POA: Diagnosis not present

## 2018-09-30 DIAGNOSIS — C4441 Basal cell carcinoma of skin of scalp and neck: Secondary | ICD-10-CM | POA: Diagnosis not present

## 2018-10-02 ENCOUNTER — Other Ambulatory Visit: Payer: Self-pay | Admitting: Internal Medicine

## 2018-10-02 DIAGNOSIS — E039 Hypothyroidism, unspecified: Secondary | ICD-10-CM

## 2018-12-22 ENCOUNTER — Other Ambulatory Visit: Payer: Self-pay | Admitting: Internal Medicine

## 2018-12-22 DIAGNOSIS — E039 Hypothyroidism, unspecified: Secondary | ICD-10-CM

## 2019-01-08 ENCOUNTER — Other Ambulatory Visit: Payer: Self-pay

## 2019-01-08 ENCOUNTER — Ambulatory Visit: Payer: 59

## 2019-01-15 ENCOUNTER — Ambulatory Visit (INDEPENDENT_AMBULATORY_CARE_PROVIDER_SITE_OTHER): Payer: 59

## 2019-01-15 ENCOUNTER — Other Ambulatory Visit: Payer: Self-pay

## 2019-01-15 DIAGNOSIS — Z23 Encounter for immunization: Secondary | ICD-10-CM

## 2019-01-21 ENCOUNTER — Other Ambulatory Visit: Payer: Self-pay | Admitting: Internal Medicine

## 2019-01-21 DIAGNOSIS — E039 Hypothyroidism, unspecified: Secondary | ICD-10-CM

## 2019-02-19 ENCOUNTER — Other Ambulatory Visit: Payer: Self-pay | Admitting: Internal Medicine

## 2019-02-19 DIAGNOSIS — K219 Gastro-esophageal reflux disease without esophagitis: Secondary | ICD-10-CM

## 2019-02-28 ENCOUNTER — Other Ambulatory Visit: Payer: Self-pay | Admitting: Internal Medicine

## 2019-02-28 DIAGNOSIS — E039 Hypothyroidism, unspecified: Secondary | ICD-10-CM

## 2019-05-23 ENCOUNTER — Encounter: Payer: Self-pay | Admitting: Internal Medicine

## 2019-05-23 ENCOUNTER — Other Ambulatory Visit: Payer: Self-pay

## 2019-05-23 ENCOUNTER — Ambulatory Visit (INDEPENDENT_AMBULATORY_CARE_PROVIDER_SITE_OTHER): Payer: 59 | Admitting: Internal Medicine

## 2019-05-23 VITALS — BP 118/78 | HR 84 | Temp 97.5°F | Resp 16 | Ht 69.5 in | Wt 217.0 lb

## 2019-05-23 DIAGNOSIS — N410 Acute prostatitis: Secondary | ICD-10-CM | POA: Insufficient documentation

## 2019-05-23 LAB — URINALYSIS, ROUTINE W REFLEX MICROSCOPIC
Bilirubin Urine: NEGATIVE
Hgb urine dipstick: NEGATIVE
Ketones, ur: NEGATIVE
Leukocytes,Ua: NEGATIVE
Nitrite: NEGATIVE
RBC / HPF: NONE SEEN (ref 0–?)
Specific Gravity, Urine: 1.025 (ref 1.000–1.030)
Total Protein, Urine: NEGATIVE
Urine Glucose: NEGATIVE
Urobilinogen, UA: 0.2 (ref 0.0–1.0)
WBC, UA: NONE SEEN (ref 0–?)
pH: 5.5 (ref 5.0–8.0)

## 2019-05-23 MED ORDER — LEVOFLOXACIN 500 MG PO TABS: 500.0000 mg | ORAL_TABLET | Freq: Every day | ORAL | 0 refills | Status: AC

## 2019-05-23 MED ORDER — ALFUZOSIN HCL ER 10 MG PO TB24: 10.0000 mg | ORAL_TABLET | Freq: Every day | ORAL | 0 refills | Status: DC

## 2019-05-23 NOTE — Patient Instructions (Signed)

## 2019-05-23 NOTE — Progress Notes (Signed)
Subjective:  Patient ID: Jay Holmes, male    DOB: 1956/01/02  Age: 64 y.o. MRN: [id number]  CC: Urinary Tract Infection  This visit occurred during the SARS-CoV-2 public health emergency.  Safety protocols were in place, including screening questions prior to the visit, additional usage of staff PPE, and extensive cleaning of exam room while observing appropriate contact time as indicated for disinfecting solutions.    HPI Jay Holmes presents for concerns about a 2-day history of nocturia and urinary hesitation.  He says the onset was rather abrupt.  He is not taking anti-inflammatories or decongestants.  Outpatient Medications Prior to Visit  Medication Sig Dispense Refill  . bimatoprost (LUMIGAN) 0.01 % SOLN Place 1 drop into both eyes at bedtime.     . Brimonidine Tartrate-Timolol (COMBIGAN OP) Apply 1 drop to eye 2 (two) times daily.     . calcium gluconate 500 MG tablet Take 1 tablet by mouth daily.    [name] GLUCOSAMINE-CHONDROITIN PO Take 1,200 mg by mouth daily.    [name] ketoconazole (NIZORAL) 2 % cream Apply 1 application topically daily as needed (rosacea).    [name] levothyroxine (SYNTHROID) 100 MCG tablet Take 1 tablet (100 mcg total) by mouth daily. Annual appt due in March must see provider for future refills 90 tablet 1  . omeprazole (PRILOSEC) 40 MG capsule TAKE 1 CAPSULE BY MOUTH  DAILY 90 capsule 1  . gabapentin (NEURONTIN) 300 MG capsule Take 1 capsule (300 mg total) by mouth 3 (three) times daily. Gabapentin 300 mg Protocol Take a 300 mg capsule three times a day for two weeks following surgery. Then take a 300 mg capsule two times a day for two weeks.  Then take a 300 mg capsule once a day for two weeks.  Then discontinue the Gabapentin. 84 capsule 0   No facility-administered medications prior to visit.    ROS Review of Systems  Constitutional: Negative for chills, fatigue and fever.  HENT: Negative.   Eyes: Negative.   Respiratory: Negative for cough and shortness  of breath.   Cardiovascular: Negative for chest pain, palpitations and leg swelling.  Gastrointestinal: Negative for abdominal pain, constipation, diarrhea, nausea and vomiting.  Endocrine: Negative.   Genitourinary: Positive for difficulty urinating. Negative for decreased urine volume, dysuria, flank pain, hematuria, penile swelling, scrotal swelling, testicular pain and urgency.  Musculoskeletal: Negative for arthralgias and myalgias.  Skin: Negative.  Negative for color change and pallor.  Neurological: Negative.  Negative for dizziness and weakness.  Hematological: Negative for adenopathy. Does not bruise/bleed easily.  Psychiatric/Behavioral: Negative.     Objective:  BP 118/78 (BP Location: Left Arm, Patient Position: Sitting, Cuff Size: Large)   Pulse 84   Temp (!) 97.5 F (36.4 C) (Oral)   Resp 16   Ht 5' 9.5" (1.765 m)   Wt 217 lb (98.4 kg)   SpO2 97%   BMI 31.59 kg/m   BP Readings from Last 3 Encounters:  05/23/19 118/78  07/12/18 130/84  05/04/18 107/77    Wt Readings from Last 3 Encounters:  05/23/19 217 lb (98.4 kg)  07/12/18 198 lb 12 oz (90.2 kg)  05/03/18 203 lb (92.1 kg)    Physical Exam Vitals reviewed.  HENT:     Nose: Nose normal.     Mouth/Throat:     Mouth: Mucous membranes are moist.  Eyes:     General: No scleral icterus.    Conjunctiva/sclera: Conjunctivae normal.  Cardiovascular:     Rate and Rhythm:  Normal rate and regular rhythm.     Heart sounds: No murmur.  Pulmonary:     Effort: Pulmonary effort is normal.     Breath sounds: No stridor. No wheezing, rhonchi or rales.  Abdominal:     General: Abdomen is protuberant. Bowel sounds are normal. There is no distension.     Palpations: Abdomen is soft. There is no hepatomegaly or splenomegaly.     Tenderness: There is no abdominal tenderness.     Hernia: No hernia is present.  Genitourinary:    Pubic Area: No rash.      Penis: Normal and circumcised. No discharge, swelling or lesions.       Testes: Normal.     Epididymis:     Right: Normal.     Left: Normal.     Prostate: Enlarged and tender. No nodules present.     Rectum: Normal. Guaiac result negative. No mass, tenderness, anal fissure, external hemorrhoid or internal hemorrhoid. Normal anal tone.     Comments: 2+ smooth, symm BPH, boggy and tender Musculoskeletal:        General: Normal range of motion.     Cervical back: Neck supple.     Right lower leg: No edema.     Left lower leg: No edema.  Lymphadenopathy:     Cervical: No cervical adenopathy.  Skin:    General: Skin is warm and dry.  Neurological:     General: No focal deficit present.  Psychiatric:        Mood and Affect: Mood normal.        Behavior: Behavior normal.     Lab Results  Component Value Date   WBC 5.5 07/12/2018   HGB 15.8 07/12/2018   HCT 46.3 07/12/2018   PLT 220.0 07/12/2018   GLUCOSE 98 07/12/2018   CHOL 139 07/12/2018   TRIG 92.0 07/12/2018   HDL 43.30 07/12/2018   LDLCALC 77 07/12/2018   ALT 27 07/12/2018   AST 19 07/12/2018   NA 139 07/12/2018   K 4.2 07/12/2018   CL 103 07/12/2018   CREATININE 0.98 07/12/2018   BUN 26 (H) 07/12/2018   CO2 27 07/12/2018   TSH 3.74 07/12/2018   PSA 0.32 07/12/2018   INR 0.94 04/28/2018   HGBA1C 5.8 07/12/2018    No results found.  Assessment & Plan:   [name] was seen today for urinary tract infection.  Diagnoses and all orders for this visit:  Acute prostatitis without hematuria- His symptoms and exam are consistent with acute bacterial prostatitis that has exacerbated his BPH.  His urinalysis is normal.  We will treat with a 30-day course of levofloxacin and will offer alfuzosin for symptom relief. -     alfuzosin (UROXATRAL) 10 MG 24 hr tablet; Take 1 tablet (10 mg total) by mouth daily with breakfast. -     levofloxacin (LEVAQUIN) 500 MG tablet; Take 1 tablet (500 mg total) by mouth daily. -     Urinalysis, Routine w reflex microscopic -     Urine Culture  Comprehensive   I have discontinued Jay Holmes's gabapentin. I am also having him start on alfuzosin and levofloxacin. Additionally, I am having him maintain his bimatoprost, Brimonidine Tartrate-Timolol (COMBIGAN OP), calcium gluconate, GLUCOSAMINE-CHONDROITIN PO, ketoconazole, omeprazole, and levothyroxine.  Meds ordered this encounter  Medications  . alfuzosin (UROXATRAL) 10 MG 24 hr tablet    Sig: Take 1 tablet (10 mg total) by mouth daily with breakfast.    Dispense:  90 tablet  Refill:  0  . levofloxacin (LEVAQUIN) 500 MG tablet    Sig: Take 1 tablet (500 mg total) by mouth daily.    Dispense:  30 tablet    Refill:  0     Follow-up: Return in about 3 weeks (around 06/13/2019).  Scarlette Calico, MD

## 2019-05-25 LAB — CULTURE, URINE COMPREHENSIVE: RESULT:: NO GROWTH

## 2019-05-30 DIAGNOSIS — M25562 Pain in left knee: Secondary | ICD-10-CM | POA: Insufficient documentation

## 2019-07-13 ENCOUNTER — Encounter: Payer: 59 | Admitting: Internal Medicine

## 2019-07-19 ENCOUNTER — Other Ambulatory Visit: Payer: Self-pay

## 2019-07-19 ENCOUNTER — Encounter: Payer: Self-pay | Admitting: Internal Medicine

## 2019-07-19 ENCOUNTER — Ambulatory Visit (INDEPENDENT_AMBULATORY_CARE_PROVIDER_SITE_OTHER): Payer: 59 | Admitting: Internal Medicine

## 2019-07-19 VITALS — BP 120/80 | HR 92 | Temp 98.1°F | Resp 16 | Ht 69.5 in | Wt 217.0 lb

## 2019-07-19 DIAGNOSIS — Z0000 Encounter for general adult medical examination without abnormal findings: Secondary | ICD-10-CM | POA: Diagnosis not present

## 2019-07-19 DIAGNOSIS — E039 Hypothyroidism, unspecified: Secondary | ICD-10-CM | POA: Diagnosis not present

## 2019-07-19 LAB — CBC WITH DIFFERENTIAL/PLATELET
Basophils Absolute: 0.1 10*3/uL (ref 0.0–0.1)
Basophils Relative: 0.9 % (ref 0.0–3.0)
Eosinophils Absolute: 0.2 10*3/uL (ref 0.0–0.7)
Eosinophils Relative: 2.5 % (ref 0.0–5.0)
HCT: 44.8 % (ref 39.0–52.0)
Hemoglobin: 15.1 g/dL (ref 13.0–17.0)
Lymphocytes Relative: 30 % (ref 12.0–46.0)
Lymphs Abs: 1.8 10*3/uL (ref 0.7–4.0)
MCHC: 33.7 g/dL (ref 30.0–36.0)
MCV: 92.2 fl (ref 78.0–100.0)
Monocytes Absolute: 0.4 10*3/uL (ref 0.1–1.0)
Monocytes Relative: 7.3 % (ref 3.0–12.0)
Neutro Abs: 3.5 10*3/uL (ref 1.4–7.7)
Neutrophils Relative %: 59.3 % (ref 43.0–77.0)
Platelets: 201 10*3/uL (ref 150.0–400.0)
RBC: 4.86 Mil/uL (ref 4.22–5.81)
RDW: 14.2 % (ref 11.5–15.5)
WBC: 5.9 10*3/uL (ref 4.0–10.5)

## 2019-07-19 LAB — HEPATIC FUNCTION PANEL
ALT: 25 U/L (ref 0–53)
AST: 18 U/L (ref 0–37)
Albumin: 4.1 g/dL (ref 3.5–5.2)
Alkaline Phosphatase: 115 U/L (ref 39–117)
Bilirubin, Direct: 0.1 mg/dL (ref 0.0–0.3)
Total Bilirubin: 0.6 mg/dL (ref 0.2–1.2)
Total Protein: 6.8 g/dL (ref 6.0–8.3)

## 2019-07-19 LAB — LIPID PANEL
Cholesterol: 130 mg/dL (ref 0–200)
HDL: 39.7 mg/dL (ref >39.00)
LDL Cholesterol: 69 mg/dL (ref 0–99)
NonHDL: 90.15
Total CHOL/HDL Ratio: 3
Triglycerides: 107 mg/dL (ref 0.0–149.0)
VLDL: 21.4 mg/dL (ref 0.0–40.0)

## 2019-07-19 LAB — TSH: TSH: 2.3 u[IU]/mL (ref 0.35–4.50)

## 2019-07-19 LAB — BASIC METABOLIC PANEL
BUN: 19 mg/dL (ref 6–23)
CO2: 26 mEq/L (ref 19–32)
Calcium: 9.1 mg/dL (ref 8.4–10.5)
Chloride: 105 mEq/L (ref 96–112)
Creatinine, Ser: 0.99 mg/dL (ref 0.40–1.50)
GFR: 75.89 mL/min (ref >60.00)
Glucose, Bld: 133 mg/dL — ABNORMAL HIGH (ref 70–99)
Potassium: 4.1 mEq/L (ref 3.5–5.1)
Sodium: 137 mEq/L (ref 135–145)

## 2019-07-19 LAB — PSA: PSA: 0.25 ng/mL (ref 0.10–4.00)

## 2019-07-19 NOTE — Patient Instructions (Signed)

## 2019-07-19 NOTE — Progress Notes (Signed)
Subjective:  Patient ID: Jay Holmes, male    DOB: [birth year]  Age: 64 y.o. MRN: [id number]  CC: Annual Exam and Hypothyroidism  This visit occurred during the SARS-CoV-2 public health emergency.  Safety protocols were in place, including screening questions prior to the visit, additional usage of staff PPE, and extensive cleaning of exam room while observing appropriate contact time as indicated for disinfecting solutions.    HPI Jay Holmes presents for a CPX.  He works out on a stationary bike nearly every day for 40 minutes.  He tells me he has good endurance.  He does not experience palpitations, chest pain, shortness of breath, fatigue, diaphoresis, dizziness, lightheadedness, or palpitations.  He complains of weight gain and chronic bilateral knee pain.  Outpatient Medications Prior to Visit  Medication Sig Dispense Refill  . bimatoprost (LUMIGAN) 0.01 % SOLN Place 1 drop into both eyes at bedtime.     . Brimonidine Tartrate-Timolol (COMBIGAN OP) Apply 1 drop to eye 2 (two) times daily.     . calcium gluconate 500 MG tablet Take 1 tablet by mouth daily.    [name] GLUCOSAMINE-CHONDROITIN PO Take 1,200 mg by mouth daily.    [name] ketoconazole (NIZORAL) 2 % cream Apply 1 application topically daily as needed (rosacea).    [name] levothyroxine (SYNTHROID) 100 MCG tablet Take 1 tablet (100 mcg total) by mouth daily. Annual appt due in March must see provider for future refills 90 tablet 1  . omeprazole (PRILOSEC) 40 MG capsule TAKE 1 CAPSULE BY MOUTH  DAILY 90 capsule 1  . alfuzosin (UROXATRAL) 10 MG 24 hr tablet Take 1 tablet (10 mg total) by mouth daily with breakfast. 90 tablet 0   No facility-administered medications prior to visit.    ROS Review of Systems  Constitutional: Positive for unexpected weight change. Negative for diaphoresis and fatigue.  HENT: Negative.   Eyes: Negative for visual disturbance.  Respiratory: Negative for cough, chest tightness, shortness of breath and  wheezing.   Cardiovascular: Negative for chest pain, palpitations and leg swelling.  Gastrointestinal: Negative for abdominal pain, constipation, diarrhea, nausea and vomiting.  Endocrine: Negative.   Genitourinary: Negative.  Negative for difficulty urinating, penile swelling, scrotal swelling and testicular pain.  Musculoskeletal: Positive for arthralgias. Negative for back pain and myalgias.  Skin: Negative.  Negative for color change and pallor.  Neurological: Negative.  Negative for dizziness, weakness, light-headedness and headaches.  Hematological: Negative for adenopathy. Does not bruise/bleed easily.  Psychiatric/Behavioral: Negative.     Objective:  BP 120/80 (BP Location: Left Arm, Patient Position: Sitting, Cuff Size: Normal)   Pulse 92   Temp 98.1 F (36.7 C) (Oral)   Resp 16   Ht 5' 9.5" (1.765 m)   Wt 217 lb (98.4 kg)   SpO2 95%   BMI 31.59 kg/m   BP Readings from Last 3 Encounters:  07/19/19 120/80  05/23/19 118/78  07/12/18 130/84    Wt Readings from Last 3 Encounters:  07/19/19 217 lb (98.4 kg)  05/23/19 217 lb (98.4 kg)  07/12/18 198 lb 12 oz (90.2 kg)    Physical Exam Vitals reviewed.  Constitutional:      Appearance: Normal appearance.  HENT:     Nose: Nose normal.     Mouth/Throat:     Mouth: Mucous membranes are moist.  Eyes:     General: No scleral icterus.    Conjunctiva/sclera: Conjunctivae normal.  Cardiovascular:     Rate and Rhythm: Normal rate and regular rhythm.  Heart sounds: No murmur.  Pulmonary:     Effort: Pulmonary effort is normal.     Breath sounds: No stridor. No wheezing, rhonchi or rales.  Abdominal:     General: Abdomen is protuberant. Bowel sounds are normal. There is no distension.     Palpations: Abdomen is soft. There is no hepatomegaly, splenomegaly or mass.     Tenderness: There is no abdominal tenderness.  Musculoskeletal:        General: Normal range of motion.     Cervical back: Neck supple.     Right  lower leg: No edema.     Left lower leg: No edema.  Lymphadenopathy:     Cervical: No cervical adenopathy.  Skin:    General: Skin is warm and dry.     Coloration: Skin is not pale.  Neurological:     General: No focal deficit present.     Mental Status: He is alert.  Psychiatric:        Mood and Affect: Mood normal.        Behavior: Behavior normal.     Lab Results  Component Value Date   WBC 5.9 07/19/2019   HGB 15.1 07/19/2019   HCT 44.8 07/19/2019   PLT 201.0 07/19/2019   GLUCOSE 133 (H) 07/19/2019   CHOL 130 07/19/2019   TRIG 107.0 07/19/2019   HDL 39.70 07/19/2019   LDLCALC 69 07/19/2019   ALT 25 07/19/2019   AST 18 07/19/2019   NA 137 07/19/2019   K 4.1 07/19/2019   CL 105 07/19/2019   CREATININE 0.99 07/19/2019   BUN 19 07/19/2019   CO2 26 07/19/2019   TSH 2.30 07/19/2019   PSA 0.25 07/19/2019   INR 0.94 04/28/2018   HGBA1C 5.8 07/12/2018    No results found.  Assessment & Plan:   Shawnell was seen today for annual exam and hypothyroidism.  Diagnoses and all orders for this visit:  Acquired hypothyroidism- His TSH is in the normal range.  I do not think his weight gain is due to thyroid disease.  I recommended that he stay on the current thyroid supplementation. -     TSH -     Hepatic function panel -     CBC with Differential/Platelet -     Basic metabolic panel  Routine general medical examination at a health care facility- Exam completed, labs reviewed, vaccines reviewed and updated, colon cancer screening is up-to-date, he has a low ASCVD risk score so I did not recommend a statin for CV risk reduction, patient education material was given. -     Lipid panel -     PSA   I have discontinued Simona Huh T. Geffert's alfuzosin. I am also having him maintain his bimatoprost, Brimonidine Tartrate-Timolol (COMBIGAN OP), calcium gluconate, GLUCOSAMINE-CHONDROITIN PO, ketoconazole, omeprazole, and levothyroxine.  No orders of the defined types were placed in  this encounter.    Follow-up: Return in about 6 months (around 01/19/2020).  Scarlette Calico, MD

## 2019-07-22 ENCOUNTER — Telehealth: Payer: Self-pay

## 2019-07-22 DIAGNOSIS — Z1211 Encounter for screening for malignant neoplasm of colon: Secondary | ICD-10-CM

## 2019-07-22 MED ORDER — LEVOTHYROXINE SODIUM 100 MCG PO TABS: 100.0000 ug | ORAL_TABLET | Freq: Every day | ORAL | 1 refills | Status: DC

## 2019-07-22 NOTE — Telephone Encounter (Signed)
New message    Need a referral to GI - Dr. Angela Nevin

## 2019-07-23 NOTE — Addendum Note (Signed)
Addended by: Aviva Signs M on: 07/23/2019 11:18 AM   Modules accepted: Orders

## 2019-07-23 NOTE — Telephone Encounter (Signed)
LVM for pt to let me know via call or mychart message with the reason for the referral.   Please add reasons to referral request message as a dx code has to be added to complete the order.

## 2019-07-31 ENCOUNTER — Other Ambulatory Visit: Payer: Self-pay | Admitting: Internal Medicine

## 2019-07-31 DIAGNOSIS — K219 Gastro-esophageal reflux disease without esophagitis: Secondary | ICD-10-CM

## 2019-08-09 ENCOUNTER — Other Ambulatory Visit: Payer: Self-pay | Admitting: Internal Medicine

## 2019-08-09 DIAGNOSIS — E039 Hypothyroidism, unspecified: Secondary | ICD-10-CM

## 2019-08-23 ENCOUNTER — Telehealth: Payer: Self-pay | Admitting: Internal Medicine

## 2019-08-23 NOTE — Telephone Encounter (Signed)
New message:   Pt is calling to see if we received some forms for surgical clearance from [facility]. Pt states he is having a knee replacement on 09/06/19. Please advise.

## 2019-08-23 NOTE — Telephone Encounter (Signed)
Have you seen this form?

## 2019-08-24 NOTE — Telephone Encounter (Signed)
Called [facility] - they stated that they have not seen the form or OV notes. I will refax.

## 2019-08-24 NOTE — Telephone Encounter (Signed)
This has been faxed.

## 2019-10-12 ENCOUNTER — Ambulatory Visit (INDEPENDENT_AMBULATORY_CARE_PROVIDER_SITE_OTHER): Payer: Medicare Other | Admitting: Internal Medicine

## 2019-10-12 ENCOUNTER — Encounter: Payer: Self-pay | Admitting: Internal Medicine

## 2019-10-12 ENCOUNTER — Other Ambulatory Visit: Payer: Self-pay

## 2019-10-12 VITALS — BP 112/60 | HR 91 | Temp 98.1°F | Ht 69.5 in | Wt 208.0 lb

## 2019-10-12 DIAGNOSIS — E559 Vitamin D deficiency, unspecified: Secondary | ICD-10-CM

## 2019-10-12 DIAGNOSIS — R739 Hyperglycemia, unspecified: Secondary | ICD-10-CM

## 2019-10-12 DIAGNOSIS — E039 Hypothyroidism, unspecified: Secondary | ICD-10-CM | POA: Diagnosis not present

## 2019-10-12 DIAGNOSIS — Z0000 Encounter for general adult medical examination without abnormal findings: Secondary | ICD-10-CM

## 2019-10-12 LAB — BASIC METABOLIC PANEL
BUN: 20 mg/dL (ref 6–23)
CO2: 28 mEq/L (ref 19–32)
Calcium: 9.3 mg/dL (ref 8.4–10.5)
Chloride: 104 mEq/L (ref 96–112)
Creatinine, Ser: 0.99 mg/dL (ref 0.40–1.50)
GFR: 75.84 mL/min (ref >60.00)
Glucose, Bld: 125 mg/dL — ABNORMAL HIGH (ref 70–99)
Potassium: 3.9 mEq/L (ref 3.5–5.1)
Sodium: 137 mEq/L (ref 135–145)

## 2019-10-12 LAB — HEMOGLOBIN A1C: Hgb A1c MFr Bld: 5.8 % (ref 4.6–6.5)

## 2019-10-12 LAB — VITAMIN D 25 HYDROXY (VIT D DEFICIENCY, FRACTURES): VITD: 40.27 ng/mL (ref 30.00–100.00)

## 2019-10-12 MED ORDER — VITAMIN D3 50 MCG (2000 UT) PO CAPS: 2000.0000 [IU] | ORAL_CAPSULE | Freq: Every day | ORAL | 3 refills | Status: DC

## 2019-10-12 NOTE — Patient Instructions (Signed)

## 2019-10-12 NOTE — Progress Notes (Signed)
Subjective:  Patient ID: Jay Holmes, male    DOB: 10/12/1955  Age: 64 y.o. MRN: [id number]  CC: No chief complaint on file.   HPI Jay Holmes presents for a PCP switch visit. C/o L knee TKR F/u OA, hypothyroidism, GERD f/u  Outpatient Medications Prior to Visit  Medication Sig Dispense Refill  . bimatoprost (LUMIGAN) 0.01 % SOLN Place 1 drop into both eyes at bedtime.     . Brimonidine Tartrate-Timolol (COMBIGAN OP) Apply 1 drop to eye 2 (two) times daily.     . calcium gluconate 500 MG tablet Take 1 tablet by mouth daily.    [name] GLUCOSAMINE-CHONDROITIN PO Take 1,200 mg by mouth daily.    [name] ketoconazole (NIZORAL) 2 % cream Apply 1 application topically daily as needed (rosacea).    [name] levothyroxine (SYNTHROID) 100 MCG tablet TAKE 1 TABLET BY MOUTH  DAILY 90 tablet 1  . omeprazole (PRILOSEC) 40 MG capsule TAKE 1 CAPSULE BY MOUTH  DAILY 90 capsule 1   No facility-administered medications prior to visit.    ROS: Review of Systems  Constitutional: Negative for appetite change, fatigue and unexpected weight change.  HENT: Negative for congestion, nosebleeds, sneezing, sore throat and trouble swallowing.   Eyes: Negative for itching and visual disturbance.  Respiratory: Negative for cough.   Cardiovascular: Negative for chest pain, palpitations and leg swelling.  Gastrointestinal: Negative for abdominal distention, blood in stool, diarrhea and nausea.  Genitourinary: Negative for frequency and hematuria.  Musculoskeletal: Positive for arthralgias and gait problem. Negative for back pain, joint swelling and neck pain.  Skin: Negative for rash.  Neurological: Negative for dizziness, tremors, speech difficulty and weakness.  Psychiatric/Behavioral: Negative for agitation, dysphoric mood, sleep disturbance and suicidal ideas. The patient is not nervous/anxious.     Objective:  BP 112/60 (BP Location: Left Arm, Patient Position: Sitting, Cuff Size: Large)   Pulse 91   Temp 98.1  F (36.7 C) (Oral)   Ht 5' 9.5" (1.765 m)   Wt 208 lb (94.3 kg)   SpO2 97%   BMI 30.28 kg/m   BP Readings from Last 3 Encounters:  10/12/19 112/60  07/19/19 120/80  05/23/19 118/78    Wt Readings from Last 3 Encounters:  10/12/19 208 lb (94.3 kg)  07/19/19 217 lb (98.4 kg)  05/23/19 217 lb (98.4 kg)    Physical Exam Constitutional:      General: He is not in acute distress.    Appearance: He is well-developed.     Comments: NAD  Eyes:     Conjunctiva/sclera: Conjunctivae normal.     Pupils: Pupils are equal, round, and reactive to light.  Neck:     Thyroid: No thyromegaly.     Vascular: No JVD.  Cardiovascular:     Rate and Rhythm: Normal rate and regular rhythm.     Heart sounds: Normal heart sounds. No murmur. No friction rub. No gallop.   Pulmonary:     Effort: Pulmonary effort is normal. No respiratory distress.     Breath sounds: Normal breath sounds. No wheezing or rales.  Chest:     Chest wall: No tenderness.  Abdominal:     General: Bowel sounds are normal. There is no distension.     Palpations: Abdomen is soft. There is no mass.     Tenderness: There is no abdominal tenderness. There is no guarding or rebound.  Musculoskeletal:        General: No tenderness. Normal range of motion.  Cervical back: Normal range of motion.  Lymphadenopathy:     Cervical: No cervical adenopathy.  Skin:    General: Skin is warm and dry.     Findings: No rash.  Neurological:     Mental Status: He is alert and oriented to person, place, and time.     Cranial Nerves: No cranial nerve deficit.     Motor: No abnormal muscle tone.     Coordination: Coordination normal.     Gait: Gait normal.     Deep Tendon Reflexes: Reflexes are normal and symmetric.  Psychiatric:        Behavior: Behavior normal.        Thought Content: Thought content normal.        Judgment: Judgment normal.    L knee w/post-op swelling  Moles  Lab Results  Component Value Date   WBC 5.9  07/19/2019   HGB 15.1 07/19/2019   HCT 44.8 07/19/2019   PLT 201.0 07/19/2019   GLUCOSE 133 (H) 07/19/2019   CHOL 130 07/19/2019   TRIG 107.0 07/19/2019   HDL 39.70 07/19/2019   LDLCALC 69 07/19/2019   ALT 25 07/19/2019   AST 18 07/19/2019   NA 137 07/19/2019   K 4.1 07/19/2019   CL 105 07/19/2019   CREATININE 0.99 07/19/2019   BUN 19 07/19/2019   CO2 26 07/19/2019   TSH 2.30 07/19/2019   PSA 0.25 07/19/2019   INR 0.94 04/28/2018   HGBA1C 5.8 07/12/2018    No results found.  Assessment & Plan:    Walker Kehr, MD

## 2019-10-12 NOTE — Assessment & Plan Note (Signed)
Labs  Lost wt

## 2019-10-12 NOTE — Assessment & Plan Note (Addendum)
Labs On Levothyroxine

## 2020-01-09 ENCOUNTER — Other Ambulatory Visit: Payer: Self-pay | Admitting: Internal Medicine

## 2020-01-09 DIAGNOSIS — K219 Gastro-esophageal reflux disease without esophagitis: Secondary | ICD-10-CM

## 2020-01-16 LAB — HM COLONOSCOPY

## 2020-01-18 ENCOUNTER — Other Ambulatory Visit: Payer: Self-pay | Admitting: Internal Medicine

## 2020-01-18 DIAGNOSIS — E039 Hypothyroidism, unspecified: Secondary | ICD-10-CM

## 2020-02-03 ENCOUNTER — Other Ambulatory Visit: Payer: Self-pay | Admitting: Internal Medicine

## 2020-02-03 DIAGNOSIS — E039 Hypothyroidism, unspecified: Secondary | ICD-10-CM

## 2020-02-15 ENCOUNTER — Other Ambulatory Visit: Payer: Self-pay | Admitting: Internal Medicine

## 2020-02-15 DIAGNOSIS — E039 Hypothyroidism, unspecified: Secondary | ICD-10-CM

## 2020-02-28 MED ORDER — LEVOTHYROXINE SODIUM 100 MCG PO TABS: 100.0000 ug | ORAL_TABLET | Freq: Every day | ORAL | 1 refills | Status: DC

## 2020-02-28 NOTE — Telephone Encounter (Signed)
° ° °  Patient is no longer under Dr Ronnald Ramp, Patient is a Dr Alain Marion patient, last seen 10/12/19  Please refill levothyroxine (SYNTHROID) 100 MCG tablet/ [facility]

## 2020-02-28 NOTE — Telephone Encounter (Signed)
rx sent to [email or web address] Holmes

## 2020-02-28 NOTE — Addendum Note (Signed)
Addended by: Earnstine Regal on: 02/28/2020 11:04 AM   Modules accepted: Orders

## 2020-04-03 ENCOUNTER — Other Ambulatory Visit: Payer: Self-pay | Admitting: Internal Medicine

## 2020-04-03 DIAGNOSIS — K219 Gastro-esophageal reflux disease without esophagitis: Secondary | ICD-10-CM

## 2020-06-24 ENCOUNTER — Other Ambulatory Visit: Payer: Self-pay | Admitting: Internal Medicine

## 2020-06-24 DIAGNOSIS — E039 Hypothyroidism, unspecified: Secondary | ICD-10-CM

## 2020-06-26 ENCOUNTER — Ambulatory Visit (INDEPENDENT_AMBULATORY_CARE_PROVIDER_SITE_OTHER): Payer: Medicare Other | Admitting: Internal Medicine

## 2020-06-26 ENCOUNTER — Other Ambulatory Visit: Payer: Self-pay

## 2020-06-26 ENCOUNTER — Encounter: Payer: Self-pay | Admitting: Internal Medicine

## 2020-06-26 VITALS — BP 110/72 | HR 72 | Temp 98.0°F

## 2020-06-26 DIAGNOSIS — R682 Dry mouth, unspecified: Secondary | ICD-10-CM | POA: Diagnosis not present

## 2020-06-26 DIAGNOSIS — K219 Gastro-esophageal reflux disease without esophagitis: Secondary | ICD-10-CM | POA: Diagnosis not present

## 2020-06-26 DIAGNOSIS — R739 Hyperglycemia, unspecified: Secondary | ICD-10-CM | POA: Diagnosis not present

## 2020-06-26 LAB — COMPREHENSIVE METABOLIC PANEL
ALT: 33 U/L (ref 0–53)
AST: 20 U/L (ref 0–37)
Albumin: 4.1 g/dL (ref 3.5–5.2)
Alkaline Phosphatase: 106 U/L (ref 39–117)
BUN: 19 mg/dL (ref 6–23)
CO2: 29 mEq/L (ref 19–32)
Calcium: 9.1 mg/dL (ref 8.4–10.5)
Chloride: 105 mEq/L (ref 96–112)
Creatinine, Ser: 1.22 mg/dL (ref 0.40–1.50)
GFR: 62.08 mL/min (ref >60.00)
Glucose, Bld: 86 mg/dL (ref 70–99)
Potassium: 4.9 mEq/L (ref 3.5–5.1)
Sodium: 139 mEq/L (ref 135–145)
Total Bilirubin: 0.5 mg/dL (ref 0.2–1.2)
Total Protein: 7.1 g/dL (ref 6.0–8.3)

## 2020-06-26 LAB — HEMOGLOBIN A1C: Hgb A1c MFr Bld: 6.1 % (ref 4.6–6.5)

## 2020-06-26 NOTE — Patient Instructions (Signed)
Stop using Listerine Use [employer] Peroxicare tooth paste

## 2020-06-26 NOTE — Assessment & Plan Note (Signed)
On Omeprazole 

## 2020-06-26 NOTE — Assessment & Plan Note (Signed)
r/o DM Stop using Listerine Use [employer] Peroxicare tooth paste

## 2020-06-26 NOTE — Progress Notes (Signed)
Subjective:  Patient ID: Jay Holmes, male    DOB: 1956-04-25  Age: 65 y.o. MRN: [id number]  CC: Dry mouth   HPI Jay Holmes presents for dry mouth x 2 years.  Using Listerine C/o dizziness, wt gain F/u elevated glucose  Outpatient Medications Prior to Visit  Medication Sig Dispense Refill  . bimatoprost (LUMIGAN) 0.01 % SOLN Place 1 drop into both eyes at bedtime.    . Brimonidine Tartrate-Timolol (COMBIGAN OP) Apply 1 drop to eye 2 (two) times daily.     . calcium gluconate 500 MG tablet Take 1 tablet by mouth daily.    . Cholecalciferol (VITAMIN D3) 50 MCG (2000 UT) capsule Take 1 capsule (2,000 Units total) by mouth daily. 100 capsule 3  . GLUCOSAMINE-CHONDROITIN PO Take 1,200 mg by mouth daily.    [name] ketoconazole (NIZORAL) 2 % cream Apply 1 application topically daily as needed (rosacea).    [name] levothyroxine (SYNTHROID) 100 MCG tablet Take 1 tablet (100 mcg total) by mouth daily. Annual appt due in March must see provider for future refills 90 tablet 0  . omeprazole (PRILOSEC) 40 MG capsule TAKE 1 CAPSULE BY MOUTH  DAILY 90 capsule 1   No facility-administered medications prior to visit.    ROS: Review of Systems  Constitutional: Positive for unexpected weight change. Negative for appetite change and fatigue.  HENT: Negative for congestion, nosebleeds, sneezing, sore throat and trouble swallowing.   Eyes: Negative for itching and visual disturbance.  Respiratory: Negative for cough.   Cardiovascular: Negative for chest pain, palpitations and leg swelling.  Gastrointestinal: Negative for abdominal distention, blood in stool, diarrhea and nausea.  Genitourinary: Negative for frequency and hematuria.  Musculoskeletal: Negative for back pain, gait problem, joint swelling and neck pain.  Skin: Negative for rash.  Neurological: Negative for dizziness, tremors, speech difficulty and weakness.  Psychiatric/Behavioral: Negative for agitation, dysphoric mood and sleep  disturbance. The patient is not nervous/anxious.     Objective:  BP 110/72 (BP Location: Left Arm)   Pulse 72   Temp 98 F (36.7 C) (Oral)   SpO2 98%   BP Readings from Last 3 Encounters:  06/26/20 110/72  10/12/19 112/60  07/19/19 120/80    Wt Readings from Last 3 Encounters:  10/12/19 208 lb (94.3 kg)  07/19/19 217 lb (98.4 kg)  05/23/19 217 lb (98.4 kg)    Physical Exam Constitutional:      General: He is not in acute distress.    Appearance: He is well-developed. He is obese.     Comments: NAD  HENT:     Mouth/Throat:     Mouth: Oropharynx is clear and moist.  Eyes:     Conjunctiva/sclera: Conjunctivae normal.     Pupils: Pupils are equal, round, and reactive to light.  Neck:     Thyroid: No thyromegaly.     Vascular: No JVD.  Cardiovascular:     Rate and Rhythm: Normal rate and regular rhythm.     Pulses: Intact distal pulses.     Heart sounds: Normal heart sounds. No murmur heard. No friction rub. No gallop.   Pulmonary:     Effort: Pulmonary effort is normal. No respiratory distress.     Breath sounds: Normal breath sounds. No wheezing or rales.  Chest:     Chest wall: No tenderness.  Abdominal:     General: Bowel sounds are normal. There is no distension.     Palpations: Abdomen is soft. There is no mass.  Tenderness: There is no abdominal tenderness. There is no guarding or rebound.  Musculoskeletal:        General: No tenderness or edema. Normal range of motion.     Cervical back: Normal range of motion.  Lymphadenopathy:     Cervical: No cervical adenopathy.  Skin:    General: Skin is warm and dry.     Findings: No rash.  Neurological:     Mental Status: He is alert and oriented to person, place, and time.     Cranial Nerves: No cranial nerve deficit.     Motor: No abnormal muscle tone.     Coordination: He displays a negative Romberg sign. Coordination normal.     Gait: Gait normal.     Deep Tendon Reflexes: Reflexes are normal and  symmetric.  Psychiatric:        Mood and Affect: Mood and affect normal.        Behavior: Behavior normal.        Thought Content: Thought content normal.        Judgment: Judgment normal.     Lab Results  Component Value Date   WBC 5.9 07/19/2019   HGB 15.1 07/19/2019   HCT 44.8 07/19/2019   PLT 201.0 07/19/2019   GLUCOSE 125 (H) 10/12/2019   CHOL 130 07/19/2019   TRIG 107.0 07/19/2019   HDL 39.70 07/19/2019   LDLCALC 69 07/19/2019   ALT 25 07/19/2019   AST 18 07/19/2019   NA 137 10/12/2019   K 3.9 10/12/2019   CL 104 10/12/2019   CREATININE 0.99 10/12/2019   BUN 20 10/12/2019   CO2 28 10/12/2019   TSH 2.30 07/19/2019   PSA 0.25 07/19/2019   INR 0.94 04/28/2018   HGBA1C 5.8 10/12/2019    No results found.  Assessment & Plan:   There are no diagnoses linked to this encounter.   No orders of the defined types were placed in this encounter.    Follow-up: No follow-ups on file.  Walker Kehr, MD

## 2020-08-14 ENCOUNTER — Other Ambulatory Visit: Payer: Self-pay | Admitting: Internal Medicine

## 2020-08-14 DIAGNOSIS — E039 Hypothyroidism, unspecified: Secondary | ICD-10-CM

## 2020-09-06 ENCOUNTER — Other Ambulatory Visit: Payer: Self-pay | Admitting: Internal Medicine

## 2020-09-06 DIAGNOSIS — E039 Hypothyroidism, unspecified: Secondary | ICD-10-CM

## 2020-09-19 ENCOUNTER — Encounter: Payer: Self-pay | Admitting: Internal Medicine

## 2020-09-19 ENCOUNTER — Other Ambulatory Visit: Payer: Self-pay

## 2020-09-19 ENCOUNTER — Ambulatory Visit (INDEPENDENT_AMBULATORY_CARE_PROVIDER_SITE_OTHER): Payer: Medicare Other | Admitting: Internal Medicine

## 2020-09-19 DIAGNOSIS — Z0000 Encounter for general adult medical examination without abnormal findings: Secondary | ICD-10-CM

## 2020-09-19 DIAGNOSIS — K219 Gastro-esophageal reflux disease without esophagitis: Secondary | ICD-10-CM

## 2020-09-19 DIAGNOSIS — R739 Hyperglycemia, unspecified: Secondary | ICD-10-CM

## 2020-09-19 DIAGNOSIS — E039 Hypothyroidism, unspecified: Secondary | ICD-10-CM | POA: Diagnosis not present

## 2020-09-19 MED ORDER — LEVOTHYROXINE SODIUM 100 MCG PO TABS: 100.0000 ug | ORAL_TABLET | Freq: Every day | ORAL | 3 refills | Status: DC

## 2020-09-19 MED ORDER — OMEPRAZOLE 40 MG PO CPDR: 1.0000 | DELAYED_RELEASE_CAPSULE | Freq: Every day | ORAL | 3 refills | Status: DC

## 2020-09-19 NOTE — Patient Instructions (Addendum)

## 2020-09-19 NOTE — Assessment & Plan Note (Signed)
On Levothyroxine Check TSH

## 2020-09-19 NOTE — Assessment & Plan Note (Signed)
Check A1c. 

## 2020-09-19 NOTE — Progress Notes (Signed)
Subjective:  Patient ID: Jay Holmes, male    DOB: [birth year]  Age: 65 y.o. MRN: [id number]  CC: Follow-up (F/U on his thyroid- Req refill on Levothyroxine)   HPI Jay Holmes presents for GERD, hypothyroidism, elevated glucose  Outpatient Medications Prior to Visit  Medication Sig Dispense Refill  . bimatoprost (LUMIGAN) 0.01 % SOLN Place 1 drop into both eyes at bedtime.    . Brimonidine Tartrate-Timolol (COMBIGAN OP) Apply 1 drop to eye 2 (two) times daily.     . calcium gluconate 500 MG tablet Take 1 tablet by mouth daily.    . Cholecalciferol (VITAMIN D3) 50 MCG (2000 UT) capsule Take 1 capsule (2,000 Units total) by mouth daily. 100 capsule 3  . GLUCOSAMINE-CHONDROITIN PO Take 1,200 mg by mouth daily.    [name] ketoconazole (NIZORAL) 2 % cream Apply 1 application topically daily as needed (rosacea).    [name] omeprazole (PRILOSEC) 40 MG capsule TAKE 1 CAPSULE BY MOUTH  DAILY 90 capsule 1  . levothyroxine (SYNTHROID) 100 MCG tablet Take 1 tablet (100 mcg total) by mouth daily. Annual appt due in March must see provider for future refills 90 tablet 0   No facility-administered medications prior to visit.    ROS: Review of Systems  Constitutional: Negative for appetite change, fatigue and unexpected weight change.  HENT: Negative for congestion, nosebleeds, sneezing, sore throat and trouble swallowing.   Eyes: Negative for itching and visual disturbance.  Respiratory: Negative for cough.   Cardiovascular: Negative for chest pain, palpitations and leg swelling.  Gastrointestinal: Negative for abdominal distention, blood in stool, diarrhea and nausea.  Genitourinary: Negative for frequency and hematuria.  Musculoskeletal: Negative for back pain, gait problem, joint swelling and neck pain.  Skin: Negative for rash.  Neurological: Negative for dizziness, tremors, speech difficulty and weakness.  Psychiatric/Behavioral: Negative for agitation, dysphoric mood and sleep disturbance. The  patient is not nervous/anxious.     Objective:  There were no vitals taken for this visit.  BP Readings from Last 3 Encounters:  06/26/20 110/72  10/12/19 112/60  07/19/19 120/80    Wt Readings from Last 3 Encounters:  10/12/19 208 lb (94.3 kg)  07/19/19 217 lb (98.4 kg)  05/23/19 217 lb (98.4 kg)    Physical Exam Constitutional:      General: He is not in acute distress.    Appearance: He is well-developed. He is obese.     Comments: NAD  Eyes:     Conjunctiva/sclera: Conjunctivae normal.     Pupils: Pupils are equal, round, and reactive to light.  Neck:     Thyroid: No thyromegaly.     Vascular: No JVD.  Cardiovascular:     Rate and Rhythm: Normal rate and regular rhythm.     Heart sounds: Normal heart sounds. No murmur heard. No friction rub. No gallop.   Pulmonary:     Effort: Pulmonary effort is normal. No respiratory distress.     Breath sounds: Normal breath sounds. No wheezing or rales.  Chest:     Chest wall: No tenderness.  Abdominal:     General: Bowel sounds are normal. There is no distension.     Palpations: Abdomen is soft. There is no mass.     Tenderness: There is no abdominal tenderness. There is no guarding or rebound.  Musculoskeletal:        General: No tenderness. Normal range of motion.     Cervical back: Normal range of motion.  Lymphadenopathy:  Cervical: No cervical adenopathy.  Skin:    General: Skin is warm and dry.     Findings: No rash.  Neurological:     Mental Status: He is alert and oriented to person, place, and time.     Cranial Nerves: No cranial nerve deficit.     Motor: No abnormal muscle tone.     Coordination: Coordination normal.     Gait: Gait normal.     Deep Tendon Reflexes: Reflexes are normal and symmetric.  Psychiatric:        Behavior: Behavior normal.        Thought Content: Thought content normal.        Judgment: Judgment normal.     Lab Results  Component Value Date   WBC 5.9 07/19/2019   HGB 15.1  07/19/2019   HCT 44.8 07/19/2019   PLT 201.0 07/19/2019   GLUCOSE 86 06/26/2020   CHOL 130 07/19/2019   TRIG 107.0 07/19/2019   HDL 39.70 07/19/2019   LDLCALC 69 07/19/2019   ALT 33 06/26/2020   AST 20 06/26/2020   NA 139 06/26/2020   K 4.9 06/26/2020   CL 105 06/26/2020   CREATININE 1.22 06/26/2020   BUN 19 06/26/2020   CO2 29 06/26/2020   TSH 2.30 07/19/2019   PSA 0.25 07/19/2019   INR 0.94 04/28/2018   HGBA1C 6.1 06/26/2020    No results found.  Assessment & Plan:   Jay Holmes was seen today for follow-up.  Diagnoses and all orders for this visit:  Acquired hypothyroidism -     levothyroxine (SYNTHROID) 100 MCG tablet; Take 1 tablet (100 mcg total) by mouth daily.     Meds ordered this encounter  Medications  . levothyroxine (SYNTHROID) 100 MCG tablet    Sig: Take 1 tablet (100 mcg total) by mouth daily.    Dispense:  90 tablet    Refill:  3    Requesting 1 year supply     Follow-up: No follow-ups on file.  Walker Kehr, MD

## 2020-09-19 NOTE — Assessment & Plan Note (Signed)
Cont w/ Omeprazole °

## 2020-10-23 ENCOUNTER — Other Ambulatory Visit: Payer: Medicare Other

## 2020-11-06 ENCOUNTER — Other Ambulatory Visit: Payer: Self-pay

## 2020-11-06 ENCOUNTER — Ambulatory Visit (INDEPENDENT_AMBULATORY_CARE_PROVIDER_SITE_OTHER)
Admission: RE | Admit: 2020-11-06 | Discharge: 2020-11-06 | Disposition: A | Discharge status: Home / Self Care | Payer: Self-pay | Source: Ambulatory Visit | Attending: Internal Medicine | Admitting: Internal Medicine

## 2020-11-06 DIAGNOSIS — R739 Hyperglycemia, unspecified: Secondary | ICD-10-CM

## 2020-11-07 ENCOUNTER — Encounter: Payer: Self-pay | Admitting: Internal Medicine

## 2020-11-07 DIAGNOSIS — I700 Atherosclerosis of aorta: Secondary | ICD-10-CM | POA: Insufficient documentation

## 2020-11-07 DIAGNOSIS — R918 Other nonspecific abnormal finding of lung field: Secondary | ICD-10-CM | POA: Insufficient documentation

## 2020-11-07 DIAGNOSIS — I2510 Atherosclerotic heart disease of native coronary artery without angina pectoris: Secondary | ICD-10-CM | POA: Insufficient documentation

## 2021-01-08 ENCOUNTER — Other Ambulatory Visit: Payer: Self-pay

## 2021-01-08 ENCOUNTER — Ambulatory Visit (INDEPENDENT_AMBULATORY_CARE_PROVIDER_SITE_OTHER): Payer: Medicare Other

## 2021-01-08 DIAGNOSIS — Z0000 Encounter for general adult medical examination without abnormal findings: Secondary | ICD-10-CM

## 2021-01-08 NOTE — Progress Notes (Addendum)
I connected with Jay Holmes today by telephone and verified that I am speaking with the correct person using two identifiers. Location patient: home Location provider: work Persons participating in the virtual visit: patient, provider.   I discussed the limitations, risks, security and privacy concerns of performing an evaluation and management service by telephone and the availability of in person appointments. I also discussed with the patient that there may be a patient responsible charge related to this service. The patient expressed understanding and verbally consented to this telephonic visit.    Interactive audio and video telecommunications were attempted between this provider and patient, however failed, due to patient having technical difficulties OR patient did not have access to video capability.  We continued and completed visit with audio only.  Some vital signs may be absent or patient reported.   Time Spent with patient on telephone encounter: 30 minutes  Subjective:   Jay Holmes is a 65 y.o. male who presents for Medicare Annual/Subsequent preventive examination.  Review of Systems     Cardiac Risk Factors include: advanced age (>20mn, >>64women);family history of premature cardiovascular disease;male gender     Objective:    There were no vitals filed for this visit. There is no height or weight on file to calculate BMI.  Advanced Directives 01/08/2021 05/03/2018 04/28/2018 10/29/2016 08/07/2015  Does Patient Have a Medical Advance Directive? Yes No No No No  Type of AP[place]amed[place]of [place] will - - - -  Does patient want to make changes to medical advance directive? No - Patient declined - - - -  Copy of [place] Chart? No - copy requested - - - -  Would patient like information on creating a medical advance directive? - No - Patient declined No - Patient declined - -    Current Medications  (verified) Outpatient Encounter Medications as of 01/08/2021  Medication Sig   bimatoprost (LUMIGAN) 0.01 % SOLN Place 1 drop into both eyes at bedtime.   Brimonidine Tartrate-Timolol (COMBIGAN OP) Apply 1 drop to eye 2 (two) times daily.    calcium gluconate 500 MG tablet Take 1 tablet by mouth daily.   Cholecalciferol (VITAMIN D3) 50 MCG (2000 UT) capsule Take 1 capsule (2,000 Units total) by mouth daily.   GLUCOSAMINE-CHONDROITIN PO Take 1,200 mg by mouth daily.   ketoconazole (NIZORAL) 2 % cream Apply 1 application topically daily as needed (rosacea).   levothyroxine (SYNTHROID) 100 MCG tablet Take 1 tablet (100 mcg total) by mouth daily.   omeprazole (PRILOSEC) 40 MG capsule Take 1 capsule (40 mg total) by mouth daily.   No facility-administered encounter medications on file as of 01/08/2021.    Allergies (verified) Patient has no known allergies.   History: Past Medical History:  Diagnosis Date   Arthritis    Diastasis recti 07/21/2012   questionable umbilical hernia   Glaucoma    Personal history of diseases of skin and subcutaneous tissue    Personal history of urinary calculi    Unspecified hypothyroidism    Unspecified sinusitis (chronic)    Past Surgical History:  Procedure Laterality Date   Cataract surgery Bilateral    COLONOSCOPY      TONSILLECTOMY AND ADENOIDECTOMY     TOTAL KNEE ARTHROPLASTY Right 05/03/2018   Procedure: TOTAL KNEE ARTHROPLASTY;  Surgeon: AGaynelle Arabian MD;  Location: [facility];  Service: Orthopedics;  Laterality: Right;   Family History  Problem Relation Age of Onset   Memory loss  Mother    Hypertension Mother    Hyperlipidemia Mother    Cancer Mother        breast cancer   Cancer Father        prostate   Diabetes Father        diet controlled   Cleft lip Son    Heart disease Maternal Grandmother    Colon cancer Neg Hx    Social History   Socioeconomic History   Marital status: Married    Spouse name: Not on file   Number of  children: 2   Years of education: 15   Highest education level: Not on file  Occupational History   Occupation: Civil engineer, contracting - retired    Fish farm manager: [place]: [employer] health de[employer]16  Tobacco Use   Smoking status: Never   Smokeless tobacco: Never  Substance and Sexual Activity   Alcohol use: No   Drug use: No   Sexual activity: Yes    Partners: Female  Other Topics Concern   Not on file  Social History Narrative   [place]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 [place], 1 son 2003 (cleft lip/palate). wife is healthy.  work: Psychologist, occupational at [employer] of [employer]: Low Risk    Difficulty of Paying Living Expenses: Not hard at all  Food Insecurity: No Food Insecurity   Worried About Charity fundraiser in the Last Year: Never true   Arboriculturist in the Last Year: Never true  Transportation Needs: No Transportation Needs   Lack of Transportation (Medical): No   Lack of Transportation (Non-Medical): No  Physical Activity: Sufficiently Active   Days of Exercise per Week: 7 days   Minutes of Exercise per Session: 50 min  Stress: No Stress Concern Present   Feeling of Stress : Not at all  Social Connections: Socially Integrated   Frequency of Communication with Friends and Family: More than three times a week   Frequency of Social Gatherings with Friends and Family: More than three times a week   Attends Religious Services: More than 4 times per year   Active Member of [employer] or Organizations: Yes   Attends Music therapist: More than 4 times per year   Marital Status: Married    Tobacco Counseling Counseling given: Not Answered   Clinical Intake:  Pre-visit preparation completed: Yes  Pain : No/denies pain     Nutritional Risks: None Diabetes: No  How often do you need to have someone help you when you read instructions, pamphlets, or other written materials from your doctor or pharmacy?: 1 -  Never What is the last grade level you completed in school?: Bachelor's Degree from [place]  Diabetic? no  Interpreter Needed?: No  Information entered by :: Lisette Abu, LPN   Activities of Daily Living In your present state of health, do you have any difficulty performing the following activities: 01/08/2021  Hearing? N  Vision? N  Difficulty concentrating or making decisions? N  Walking or climbing stairs? N  Dressing or bathing? N  Doing errands, shopping? N  Preparing Food and eating ? N  Using the Toilet? N  In the past six months, have you accidently leaked urine? N  Do you have problems with loss of bowel control? N  Managing your Medications? N  Managing your Finances? N  Housekeeping or managing your Housekeeping? N  Some recent data might be hidden  Patient Care Team: Plotnikov, Evie Lacks, MD as PCP - General (Internal Medicine) Clent Jacks, MD as Consulting Physician (Ophthalmology) Gaynelle Arabian, MD as Consulting Physician (Orthopedic Surgery) Danella Sensing, MD as Consulting Physician (Dermatology)  Indicate any recent Medical Services you may have received from other than [facility] providers in the past year (date may be approximate).     Assessment:   This is a routine wellness examination for [place].  Hearing/Vision screen Hearing Screening - Comments:: Patient denied any hearing difficulty.  No hearing aids. Vision Screening - Comments:: Patient does not wear any corrective lenses.  Eye exam done annually by Dr. Clent Jacks.  Dietary issues and exercise activities discussed: Current Exercise Habits: Home exercise routine, Type of exercise: walking, Time (Minutes): 50, Frequency (Times/Week): 7, Weekly Exercise (Minutes/Week): 350, Intensity: Moderate, Exercise limited by: orthopedic condition(s)   Goals Addressed               This Visit's Progress     Patient Stated (pt-stated)        My goal is to lose some weight.  My ideal weight is  170-175 pounds.      Depression Screen PHQ 2/9 Scores 01/08/2021 06/26/2020 05/23/2019 07/12/2018 04/06/2017 01/22/2016  PHQ - 2 Score 0 0 0 0 0 0  PHQ- 9 Score - 0 - - - -    Fall Risk Fall Risk  01/08/2021 06/26/2020 07/12/2018 01/22/2016  Falls in the past year? 0 0 0 No  Number falls in past yr: 0 0 0 -  Injury with Fall? 0 0 0 -  Risk for fall due to : No Fall Risks No Fall Risks - -  Follow up Falls evaluation completed - Falls evaluation completed -    FALL RISK PREVENTION PERTAINING TO THE HOME:  Any stairs in or around the home? Yes  If so, are there any without handrails? No  Home free of loose throw rugs in walkways, pet beds, electrical cords, etc? Yes  Adequate lighting in your home to reduce risk of falls? Yes   ASSISTIVE DEVICES UTILIZED TO PREVENT FALLS:  Life alert? No  Use of a cane, walker or w/c? No  Grab bars in the bathroom? No  Shower chair or bench in shower? No  Elevated toilet seat or a handicapped toilet? Yes   TIMED UP AND GO:  Was the test performed? No .  Length of time to ambulate 10 feet: n/a sec.   Gait steady and fast without use of assistive device  Cognitive Function: Normal cognitive status assessed by direct observation by this Nurse Health Advisor. No abnormalities found.          Immunizations Immunization History  Administered Date(s) Administered   Fluad Quad(high Dose 65+) 01/15/2019, 02/23/2020   Influenza Whole 02/14/2008, 02/13/2009, 02/03/2010   Influenza,inj,Quad PF,6+ Mos 03/23/2013, 03/10/2014, 02/27/2015, 01/22/2016, 02/18/2017, 01/26/2018   Influenza,inj,quad, With Preservative 02/10/2019   Influenza-Unspecified 03/13/2011   PFIZER(Purple Top)SARS-COV-2 Vaccination 07/26/2019, 08/17/2019, 02/23/2020   Tdap 07/21/2012   Zoster Recombinat (Shingrix) 06/17/2017, 09/21/2017    TDAP status: Up to date  Flu Vaccine status: Up to date  Pneumococcal vaccine status: Due, Education has been provided regarding the importance  of this vaccine. Advised may receive this vaccine at local pharmacy or Health Dept. Aware to provide a copy of the vaccination record if obtained from local pharmacy or Health Dept. Verbalized acceptance and understanding.  Covid-19 vaccine status: Completed vaccines  Qualifies for Shingles Vaccine? Yes   Zostavax completed No  Shingrix Completed?: Yes  Screening Tests Health Maintenance  Topic Date Due   PNA vac Low Risk Adult (1 of 2 - PCV13) Never done   COVID-19 Vaccine (4 - Booster for Pfizer series) 06/25/2020   INFLUENZA VACCINE  12/10/2020   TETANUS/TDAP  07/22/2022   COLONOSCOPY (Pts 45-68yr Insurance coverage will need to be confirmed)  01/15/2030   Hepatitis C Screening  Completed   Zoster Vaccines- Shingrix  Completed   HPV VACCINES  Aged Out    Health Maintenance  Health Maintenance Due  Topic Date Due   PNA vac Low Risk Adult (1 of 2 - PCV13) Never done   COVID-19 Vaccine (4 - Booster for Pfizer series) 06/25/2020   INFLUENZA VACCINE  12/10/2020    Colorectal cancer screening: Type of screening: Colonoscopy. Completed 01/16/2020. Repeat every 10 years  Lung Cancer Screening: (Low Dose CT Chest recommended if Age 66-80years, 30 pack-year currently smoking OR have quit w/in 15years.) does not qualify.   Lung Cancer Screening Referral: no  Additional Screening:  Hepatitis C Screening: does qualify; Completed yes  Vision Screening: Recommended annual ophthalmology exams for early detection of glaucoma and other disorders of the eye. Is the patient up to date with their annual eye exam?  Yes  Who is the provider or what is the name of the office in which the patient attends annual eye exams? RClent Jacks MD. If pt is not established with a provider, would they like to be referred to a provider to establish care? No .   Dental Screening: Recommended annual dental exams for proper oral hygiene  Community Resource Referral / Chronic Care Management: CRR  required this visit?  Yes   CCM required this visit?  No      Plan:     I have personally reviewed and noted the following in the patient's chart:   Medical and social history Use of alcohol, tobacco or illicit drugs  Current medications and supplements including opioid prescriptions. Patient is not currently taking opioid prescriptions. Functional ability and status Nutritional status Physical activity Advanced directives List of other physicians Hospitalizations, surgeries, and ER visits in previous 12 months Vitals Screenings to include cognitive, depression, and falls Referrals and appointments  In addition, I have reviewed and discussed with patient certain preventive protocols, quality metrics, and best practice recommendations. A written personalized care plan for preventive services as well as general preventive health recommendations were provided to patient.     SSheral Flow LPN   [id number]  Nurse Notes:  Hearing Screening - Comments:: Patient denied any hearing difficulty.  No hearing aids. Vision Screening - Comments:: Patient does not wear any corrective lenses.  Eye exam done annually by Dr. RClent Jacks Patient is cogitatively intact. There were no vitals filed for this visit. There is no height or weight on file to calculate BMI. Patient stated that he has no issues with gait or balance; does not use any assistive devices.  Medical screening examination/treatment/procedure(s) were performed by non-physician practitioner and as supervising physician I was immediately available for consultation/collaboration.  I agree with above. ALew Dawes MD

## 2021-01-08 NOTE — Patient Instructions (Addendum)
[name] , Thank you for taking time to come for your Medicare Wellness Visit. I appreciate your ongoing commitment to your health goals. Please review the following plan we discussed and let me know if I can assist you in the future.   Screening recommendations/referrals: Colonoscopy: 01/16/2020; due every 10 years Recommended yearly ophthalmology/optometry visit for glaucoma screening and checkup Recommended yearly dental visit for hygiene and checkup  Vaccinations: Influenza vaccine: 02/23/2020 Pneumococcal vaccine: need vaccination Tdap vaccine: 07/21/2012; due every 10 years Shingles vaccine: 06/17/2017, 09/21/2017   Covid-19: 07/26/2019, 08/17/2019, 02/23/2020  Advanced directives: Please bring a copy of your health care power of attorney and living will to the office at your convenience.  Conditions/risks identified: Yes; Client understands the importance of follow-up with providers by attending scheduled visits and discussed goals to eat healthier, increase physical activity, exercise the brain, socialize more, get enough sleep and make time for laughter.  Next appointment: Please schedule your next Medicare Wellness Visit with your Nurse Health Advisor in 1 year by calling [phone].  Preventive Care 9 Years and Older, Male Preventive care refers to lifestyle choices and visits with your health care provider that can promote health and wellness. What does preventive care include? A yearly physical exam. This is also called an annual well check. Dental exams once or twice a year. Routine eye exams. Ask your health care provider how often you should have your eyes checked. Personal lifestyle choices, including: Daily care of your teeth and gums. Regular physical activity. Eating a healthy diet. Avoiding tobacco and drug use. Limiting alcohol use. Practicing safe sex. Taking low doses of aspirin every day. Taking vitamin and mineral supplements as recommended by your health care  provider. What happens during an annual well check? The services and screenings done by your health care provider during your annual well check will depend on your age, overall health, lifestyle risk factors, and family history of disease. Counseling  Your health care provider may ask you questions about your: Alcohol use. Tobacco use. Drug use. Emotional well-being. Home and relationship well-being. Sexual activity. Eating habits. History of falls. Memory and ability to understand (cognition). Work and work Statistician. Screening  You may have the following tests or measurements: Height, weight, and BMI. Blood pressure. Lipid and cholesterol levels. These may be checked every 5 years, or more frequently if you are over 57 years old. Skin check. Lung cancer screening. You may have this screening every year starting at age 103 if you have a 30-pack-year history of smoking and currently smoke or have quit within the past 15 years. Fecal occult blood test (FOBT) of the stool. You may have this test every year starting at age 19. Flexible sigmoidoscopy or colonoscopy. You may have a sigmoidoscopy every 5 years or a colonoscopy every 10 years starting at age 48. Prostate cancer screening. Recommendations will vary depending on your family history and other risks. Hepatitis C blood test. Hepatitis B blood test. Sexually transmitted disease (STD) testing. Diabetes screening. This is done by checking your blood sugar (glucose) after you have not eaten for a while (fasting). You may have this done every 1-3 years. Abdominal aortic aneurysm (AAA) screening. You may need this if you are a current or former smoker. Osteoporosis. You may be screened starting at age 41 if you are at high risk. Talk with your health care provider about your test results, treatment options, and if necessary, the need for more tests. Vaccines  Your health care provider may recommend  certain vaccines, such  as: Influenza vaccine. This is recommended every year. Tetanus, diphtheria, and acellular pertussis (Tdap, Td) vaccine. You may need a Td booster every 10 years. Zoster vaccine. You may need this after age 42. Pneumococcal 13-valent conjugate (PCV13) vaccine. One dose is recommended after age 65. Pneumococcal polysaccharide (PPSV23) vaccine. One dose is recommended after age 41. Talk to your health care provider about which screenings and vaccines you need and how often you need them. This information is not intended to replace advice given to you by your health care provider. Make sure you discuss any questions you have with your health care provider. Document Released: 05/25/2015 Document Revised: 01/16/2016 Document Reviewed: 02/27/2015 Elsevier Interactive Patient Education  2017 [place] Prevention in the Home Falls can cause injuries. They can happen to people of all ages. There are many things you can do to make your home safe and to help prevent falls. What can I do on the outside of my home? Regularly fix the edges of walkways and driveways and fix any cracks. Remove anything that might make you trip as you walk through a door, such as a raised step or threshold. Trim any bushes or trees on the path to your home. Use bright outdoor lighting. Clear any walking paths of anything that might make someone trip, such as rocks or tools. Regularly check to see if handrails are loose or broken. Make sure that both sides of any steps have handrails. Any raised decks and porches should have guardrails on the edges. Have any leaves, snow, or ice cleared regularly. Use sand or salt on walking paths during winter. Clean up any spills in your garage right away. This includes oil or grease spills. What can I do in the bathroom? Use night lights. Install grab bars by the toilet and in the tub and shower. Do not use towel bars as grab bars. Use non-skid mats or decals in the tub or  shower. If you need to sit down in the shower, use a plastic, non-slip stool. Keep the floor dry. Clean up any water that spills on the floor as soon as it happens. Remove soap buildup in the tub or shower regularly. Attach bath mats securely with double-sided non-slip rug tape. Do not have throw rugs and other things on the floor that can make you trip. What can I do in the bedroom? Use night lights. Make sure that you have a light by your bed that is easy to reach. Do not use any sheets or blankets that are too big for your bed. They should not hang down onto the floor. Have a firm chair that has side arms. You can use this for support while you get dressed. Do not have throw rugs and other things on the floor that can make you trip. What can I do in the kitchen? Clean up any spills right away. Avoid walking on wet floors. Keep items that you use a lot in easy-to-reach places. If you need to reach something above you, use a strong step stool that has a grab bar. Keep electrical cords out of the way. Do not use floor polish or wax that makes floors slippery. If you must use wax, use non-skid floor wax. Do not have throw rugs and other things on the floor that can make you trip. What can I do with my stairs? Do not leave any items on the stairs. Make sure that there are handrails on both sides of the  stairs and use them. Fix handrails that are broken or loose. Make sure that handrails are as [place] as the stairways. Check any carpeting to make sure that it is firmly attached to the stairs. Fix any carpet that is loose or worn. Avoid having throw rugs at the top or bottom of the stairs. If you do have throw rugs, attach them to the floor with carpet tape. Make sure that you have a light switch at the top of the stairs and the bottom of the stairs. If you do not have them, ask someone to add them for you. What else can I do to help prevent falls? Wear shoes that: Do not have high heels. Have  rubber bottoms. Are comfortable and fit you well. Are closed at the toe. Do not wear sandals. If you use a stepladder: Make sure that it is fully opened. Do not climb a closed stepladder. Make sure that both sides of the stepladder are locked into place. Ask someone to hold it for you, if possible. Clearly mark and make sure that you can see: Any grab bars or handrails. First and last steps. Where the edge of each step is. Use tools that help you move around (mobility aids) if they are needed. These include: Canes. Walkers. Scooters. Crutches. Turn on the lights when you go into a dark area. Replace any light bulbs as soon as they burn out. Set up your furniture so you have a clear path. Avoid moving your furniture around. If any of your floors are uneven, fix them. If there are any pets around you, be aware of where they are. Review your medicines with your doctor. Some medicines can make you feel dizzy. This can increase your chance of falling. Ask your doctor what other things that you can do to help prevent falls. This information is not intended to replace advice given to you by your health care provider. Make sure you discuss any questions you have with your health care provider. Document Released: 02/22/2009 Document Revised: 10/04/2015 Document Reviewed: 06/02/2014 Elsevier Interactive Patient Education  2017 [employer].

## 2021-02-06 ENCOUNTER — Other Ambulatory Visit: Payer: Self-pay

## 2021-02-07 ENCOUNTER — Encounter: Payer: Self-pay | Admitting: Internal Medicine

## 2021-02-07 ENCOUNTER — Ambulatory Visit (INDEPENDENT_AMBULATORY_CARE_PROVIDER_SITE_OTHER): Payer: Medicare Other | Admitting: Internal Medicine

## 2021-02-07 VITALS — BP 120/80 | HR 79 | Temp 97.8°F | Ht 69.5 in | Wt 213.0 lb

## 2021-02-07 DIAGNOSIS — I2583 Coronary atherosclerosis due to lipid rich plaque: Secondary | ICD-10-CM

## 2021-02-07 DIAGNOSIS — Z0000 Encounter for general adult medical examination without abnormal findings: Secondary | ICD-10-CM | POA: Diagnosis not present

## 2021-02-07 DIAGNOSIS — E039 Hypothyroidism, unspecified: Secondary | ICD-10-CM | POA: Diagnosis not present

## 2021-02-07 DIAGNOSIS — I2510 Atherosclerotic heart disease of native coronary artery without angina pectoris: Secondary | ICD-10-CM

## 2021-02-07 DIAGNOSIS — I700 Atherosclerosis of aorta: Secondary | ICD-10-CM

## 2021-02-07 DIAGNOSIS — Z125 Encounter for screening for malignant neoplasm of prostate: Secondary | ICD-10-CM | POA: Diagnosis not present

## 2021-02-07 DIAGNOSIS — R739 Hyperglycemia, unspecified: Secondary | ICD-10-CM

## 2021-02-07 LAB — COMPREHENSIVE METABOLIC PANEL
ALT: 28 U/L (ref 0–53)
AST: 20 U/L (ref 0–37)
Albumin: 4.1 g/dL (ref 3.5–5.2)
Alkaline Phosphatase: 103 U/L (ref 39–117)
BUN: 17 mg/dL (ref 6–23)
CO2: 26 mEq/L (ref 19–32)
Calcium: 9.1 mg/dL (ref 8.4–10.5)
Chloride: 104 mEq/L (ref 96–112)
Creatinine, Ser: 0.96 mg/dL (ref 0.40–1.50)
GFR: 82.4 mL/min (ref >60.00)
Glucose, Bld: 117 mg/dL — ABNORMAL HIGH (ref 70–99)
Potassium: 4.3 mEq/L (ref 3.5–5.1)
Sodium: 139 mEq/L (ref 135–145)
Total Bilirubin: 0.7 mg/dL (ref 0.2–1.2)
Total Protein: 6.7 g/dL (ref 6.0–8.3)

## 2021-02-07 LAB — LIPID PANEL
Cholesterol: 125 mg/dL (ref 0–200)
HDL: 31.3 mg/dL — ABNORMAL LOW (ref >39.00)
NonHDL: 94
Total CHOL/HDL Ratio: 4
Triglycerides: 261 mg/dL — ABNORMAL HIGH (ref 0.0–149.0)
VLDL: 52.2 mg/dL — ABNORMAL HIGH (ref 0.0–40.0)

## 2021-02-07 LAB — TSH: TSH: 3.11 u[IU]/mL (ref 0.35–5.50)

## 2021-02-07 LAB — PSA: PSA: 0.26 ng/mL (ref 0.10–4.00)

## 2021-02-07 LAB — CBC WITH DIFFERENTIAL/PLATELET
Basophils Absolute: 0 10*3/uL (ref 0.0–0.1)
Basophils Relative: 0.9 % (ref 0.0–3.0)
Eosinophils Absolute: 0.2 10*3/uL (ref 0.0–0.7)
Eosinophils Relative: 3.8 % (ref 0.0–5.0)
HCT: 45.3 % (ref 39.0–52.0)
Hemoglobin: 15.2 g/dL (ref 13.0–17.0)
Lymphocytes Relative: 36.9 % (ref 12.0–46.0)
Lymphs Abs: 2.1 10*3/uL (ref 0.7–4.0)
MCHC: 33.5 g/dL (ref 30.0–36.0)
MCV: 93.1 fl (ref 78.0–100.0)
Monocytes Absolute: 0.5 10*3/uL (ref 0.1–1.0)
Monocytes Relative: 8.7 % (ref 3.0–12.0)
Neutro Abs: 2.8 10*3/uL (ref 1.4–7.7)
Neutrophils Relative %: 49.7 % (ref 43.0–77.0)
Platelets: 248 10*3/uL (ref 150.0–400.0)
RBC: 4.87 Mil/uL (ref 4.22–5.81)
RDW: 14.1 % (ref 11.5–15.5)
WBC: 5.6 10*3/uL (ref 4.0–10.5)

## 2021-02-07 LAB — LDL CHOLESTEROL, DIRECT: Direct LDL: 74 mg/dL

## 2021-02-07 LAB — HEMOGLOBIN A1C: Hgb A1c MFr Bld: 6.2 % (ref 4.6–6.5)

## 2021-02-07 MED ORDER — ASPIRIN 81 MG PO TBEC
81.0000 mg | DELAYED_RELEASE_TABLET | Freq: Every day | ORAL | 12 refills | Status: DC
Start: 2021-02-07 — End: 2021-08-21

## 2021-02-07 MED ORDER — MEGARED OMEGA-3 KRILL OIL 500 MG PO CAPS: 1.0000 | ORAL_CAPSULE | Freq: Every morning | ORAL | 3 refills | Status: DC

## 2021-02-07 NOTE — Assessment & Plan Note (Signed)
Check A1c. 

## 2021-02-07 NOTE — Assessment & Plan Note (Addendum)
  We discussed age appropriate health related issues, including available/recomended screening tests and vaccinations. Labs were ordered to be later reviewed . All questions were answered. We discussed one or more of the following - seat belt use, use of sunscreen/sun exposure exercise, fall risk reduction, second hand smoke exposure, firearm use and storage, seat belt use, a need for adhering to healthy diet and exercise. Labs were ordered.  All questions were answered. Last colon 2021 A  cardiac CT scan for calcium scoring offered 6/21 6/22 CT score 82.7 Statin or fish oil advised

## 2021-02-07 NOTE — Addendum Note (Signed)
Addended by: Boris Lown B on: 02/07/2021 02:14 PM   Modules accepted: Orders

## 2021-02-07 NOTE — Assessment & Plan Note (Signed)
Statin or fish oil advised

## 2021-02-07 NOTE — Progress Notes (Signed)
Subjective:  Patient ID: Jay Holmes, male    DOB: 1956/02/18  Age: 65 y.o. MRN: [id number]  CC: Annual Exam   HPI Jay Holmes presents for a well exam  Outpatient Medications Prior to Visit  Medication Sig Dispense Refill   bimatoprost (LUMIGAN) 0.01 % SOLN Place 1 drop into both eyes at bedtime.     Brimonidine Tartrate-Timolol (COMBIGAN OP) Apply 1 drop to eye 2 (two) times daily.      calcium gluconate 500 MG tablet Take 1 tablet by mouth daily.     Cholecalciferol (VITAMIN D3) 50 MCG (2000 UT) capsule Take 1 capsule (2,000 Units total) by mouth daily. 100 capsule 3   GLUCOSAMINE-CHONDROITIN PO Take 1,200 mg by mouth daily.     ketoconazole (NIZORAL) 2 % cream Apply 1 application topically daily as needed (rosacea).     levothyroxine (SYNTHROID) 100 MCG tablet Take 1 tablet (100 mcg total) by mouth daily. 90 tablet 3   omeprazole (PRILOSEC) 40 MG capsule Take 1 capsule (40 mg total) by mouth daily. 90 capsule 3   No facility-administered medications prior to visit.    ROS: Review of Systems  Constitutional:  Negative for appetite change, fatigue and unexpected weight change.  HENT:  Negative for congestion, nosebleeds, sneezing, sore throat and trouble swallowing.   Eyes:  Negative for itching and visual disturbance.  Respiratory:  Negative for cough.   Cardiovascular:  Negative for chest pain, palpitations and leg swelling.  Gastrointestinal:  Negative for abdominal distention, blood in stool, diarrhea and nausea.  Genitourinary:  Negative for frequency and hematuria.  Musculoskeletal:  Negative for back pain, gait problem, joint swelling and neck pain.  Skin:  Negative for rash.  Neurological:  Negative for dizziness, tremors, speech difficulty and weakness.  Psychiatric/Behavioral:  Negative for agitation, dysphoric mood and sleep disturbance. The patient is not nervous/anxious.    Objective:  BP 120/80 (BP Location: Left Arm)   Pulse 79   Temp 97.8 F (36.6 C)  (Oral)   Ht 5' 9.5" (1.765 m)   Wt 213 lb (96.6 kg)   SpO2 97%   BMI 31.00 kg/m   BP Readings from Last 3 Encounters:  02/07/21 120/80  06/26/20 110/72  10/12/19 112/60    Wt Readings from Last 3 Encounters:  02/07/21 213 lb (96.6 kg)  10/12/19 208 lb (94.3 kg)  07/19/19 217 lb (98.4 kg)    Physical Exam Constitutional:      General: He is not in acute distress.    Appearance: He is well-developed. He is obese.     Comments: NAD  Eyes:     Conjunctiva/sclera: Conjunctivae normal.     Pupils: Pupils are equal, round, and reactive to light.  Neck:     Thyroid: No thyromegaly.     Vascular: No JVD.  Cardiovascular:     Rate and Rhythm: Normal rate and regular rhythm.     Heart sounds: Normal heart sounds. No murmur heard.   No friction rub. No gallop.  Pulmonary:     Effort: Pulmonary effort is normal. No respiratory distress.     Breath sounds: Normal breath sounds. No wheezing or rales.  Chest:     Chest wall: No tenderness.  Abdominal:     General: Bowel sounds are normal. There is no distension.     Palpations: Abdomen is soft. There is no mass.     Tenderness: There is no abdominal tenderness. There is no guarding or rebound.  Musculoskeletal:  General: No tenderness. Normal range of motion.     Cervical back: Normal range of motion.  Lymphadenopathy:     Cervical: No cervical adenopathy.  Skin:    General: Skin is warm and dry.     Findings: No rash.  Neurological:     Mental Status: He is alert and oriented to person, place, and time.     Cranial Nerves: No cranial nerve deficit.     Motor: No abnormal muscle tone.     Coordination: Coordination normal.     Gait: Gait normal.     Deep Tendon Reflexes: Reflexes are normal and symmetric.  Psychiatric:        Behavior: Behavior normal.        Thought Content: Thought content normal.        Judgment: Judgment normal.   B knees w/scars   Lab Results  Component Value Date   WBC 5.9 07/19/2019    HGB 15.1 07/19/2019   HCT 44.8 07/19/2019   PLT 201.0 07/19/2019   GLUCOSE 86 06/26/2020   CHOL 130 07/19/2019   TRIG 107.0 07/19/2019   HDL 39.70 07/19/2019   LDLCALC 69 07/19/2019   ALT 33 06/26/2020   AST 20 06/26/2020   NA 139 06/26/2020   K 4.9 06/26/2020   CL 105 06/26/2020   CREATININE 1.22 06/26/2020   BUN 19 06/26/2020   CO2 29 06/26/2020   TSH 2.30 07/19/2019   PSA 0.25 07/19/2019   INR 0.94 04/28/2018   HGBA1C 6.1 06/26/2020    CT CARDIAC SCORING (SELF PAY ONLY)  Addendum Date: 11/06/2020   ADDENDUM REPORT: 11/06/2020 13:09 CLINICAL DATA:  Cardiovascular Disease Risk stratification EXAM: Coronary Calcium Score TECHNIQUE: A gated, non-contrast computed tomography scan of the heart was performed using 32mm slice thickness. Axial images were analyzed on a dedicated workstation. Calcium scoring of the coronary arteries was performed using the Agatston method. FINDINGS: Coronary Calcium Score: Left main: 0 Left anterior descending artery: 24.2 Left circumflex artery: 0 Right coronary artery: 58.5 Total: 82.7 Percentile: 49 Pericardium: Normal. Ascending Aorta: Normal caliber; aortic atherosclerosis noted. Non-cardiac: See separate report from [facility]. IMPRESSION: Coronary calcium score of 82.7. This was 89 percentile for age-, race-, and sex-matched controls. RECOMMENDATIONS: Coronary artery calcium (CAC) score is a strong predictor of incident coronary heart disease (CHD) and provides predictive information beyond traditional risk factors. CAC scoring is reasonable to use in the decision to withhold, postpone, or initiate statin therapy in intermediate-risk or selected borderline-risk asymptomatic adults (age 60-75 years and LDL-C >=70 to <190 mg/dL) who do not have diabetes or established atherosclerotic cardiovascular disease (ASCVD).* In intermediate-risk (10-year ASCVD risk >=7.5% to <20%) adults or selected borderline-risk (10-year ASCVD risk >=5% to <7.5%) adults in  whom a CAC score is measured for the purpose of making a treatment decision the following recommendations have been made: If CAC=0, it is reasonable to withhold statin therapy and reassess in 5 to 10 years, as long as higher risk conditions are absent (diabetes mellitus, family history of premature CHD in first degree relatives (males <55 years; females <65 years), cigarette smoking, or LDL >=190 mg/dL). If CAC is 1 to 99, it is reasonable to initiate statin therapy for patients >=63 years of age. If CAC is >=100 or >=75th percentile, it is reasonable to initiate statin therapy at any age. Cardiology referral should be considered for patients with CAC scores >=400 or >=75th percentile. *2018 AHA/ACC/AACVPR/AAPA/ABC/ACPM/ADA/AGS/APhA/ASPC/NLA/PCNA Guideline on the Management of Blood Cholesterol: A Report of  the [employer]/American Heart Association Task Force on Clinical Practice Guidelines. J Am Coll Cardiol. 2019;73(24):3168-3209. Kirk Ruths, MD Electronically Signed   By: Kirk Ruths M.D.   On: 11/06/2020 13:09   Result Date: 11/06/2020 EXAM: OVER-READ INTERPRETATION  CT CHEST The following report is an over-read performed by radiologist Dr. Aletta Edouard of [facility], [place] on 11/06/2020. This over-read does not include interpretation of cardiac or coronary anatomy or pathology. The coronary calcium score interpretation by the cardiologist is attached. COMPARISON:  None. FINDINGS: Vascular: No significant noncardiac vascular findings. Mediastinum/Nodes: Visualized mediastinum and hilar regions demonstrate no lymphadenopathy or masses. Lungs/Pleura: 4 mm subpleural nodule in the lateral left lower lobe on image 32/3 and 2.6 mm similar lateral subpleural nodule in the right lower lobe on image 28/3. Visualized lungs show no evidence of pulmonary edema, consolidation, pneumothorax or pleural fluid. Upper Abdomen: No acute abnormality. Musculoskeletal: No chest wall mass or  suspicious bone lesions identified. IMPRESSION: 4 mm left lower lobe and 2.6 mm right lower lobe subpleural nodules. These are statistically most likely of postinflammatory origin. No follow-up needed if patient is low-risk (and has no known or suspected primary neoplasm). Non-contrast chest CT can be considered in 12 months if patient is high-risk. This recommendation follows the consensus statement: Guidelines for Management of Incidental Pulmonary Nodules Detected on CT Images: From the Fleischner Society 2017; Radiology 2017; 284:228-243. Electronically Signed: By: Aletta Edouard M.D. On: 11/06/2020 11:45    Assessment & Plan:   Problem List Items Addressed This Visit     Atherosclerosis of aorta (HCC)     Statin or fish oil advised      Relevant Medications   aspirin 81 MG EC tablet   CAD (coronary artery disease)    Statin or fish oil advised      Relevant Medications   aspirin 81 MG EC tablet   Hyperglycemia    Check A1c      Hypothyroidism    Check TSH On Levothyroxine      Well adult exam - Primary     We discussed age appropriate health related issues, including available/recomended screening tests and vaccinations. Labs were ordered to be later reviewed . All questions were answered. We discussed one or more of the following - seat belt use, use of sunscreen/sun exposure exercise, fall risk reduction, second hand smoke exposure, firearm use and storage, seat belt use, a need for adhering to healthy diet and exercise. Labs were ordered.  All questions were answered. Last colon 2021 A  cardiac CT scan for calcium scoring offered 6/21 6/22 CT score 82.7 Statin or fish oil advised         Follow-up: Return in about 6 months (around 08/07/2021) for a follow-up visit.  Walker Kehr, MD

## 2021-02-07 NOTE — Assessment & Plan Note (Signed)
  Statin or fish oil advised

## 2021-02-07 NOTE — Assessment & Plan Note (Signed)
Check TSH On Levothyroxine

## 2021-02-08 LAB — URINALYSIS
Bilirubin Urine: NEGATIVE
Hgb urine dipstick: NEGATIVE
Ketones, ur: NEGATIVE
Leukocytes,Ua: NEGATIVE
Nitrite: NEGATIVE
Specific Gravity, Urine: >=1.03 — AB (ref 1.000–1.030)
Total Protein, Urine: NEGATIVE
Urine Glucose: NEGATIVE
Urobilinogen, UA: 0.2 (ref 0.0–1.0)
pH: 6 (ref 5.0–8.0)

## 2021-04-30 ENCOUNTER — Other Ambulatory Visit: Payer: Self-pay

## 2021-04-30 ENCOUNTER — Encounter: Payer: Self-pay | Admitting: Internal Medicine

## 2021-04-30 ENCOUNTER — Ambulatory Visit (INDEPENDENT_AMBULATORY_CARE_PROVIDER_SITE_OTHER): Payer: Medicare Other | Admitting: Internal Medicine

## 2021-04-30 VITALS — BP 109/70 | HR 71 | Temp 97.8°F | Ht 69.5 in | Wt 214.6 lb

## 2021-04-30 DIAGNOSIS — I2510 Atherosclerotic heart disease of native coronary artery without angina pectoris: Secondary | ICD-10-CM | POA: Diagnosis not present

## 2021-04-30 DIAGNOSIS — E039 Hypothyroidism, unspecified: Secondary | ICD-10-CM | POA: Diagnosis not present

## 2021-04-30 DIAGNOSIS — F419 Anxiety disorder, unspecified: Secondary | ICD-10-CM | POA: Insufficient documentation

## 2021-04-30 DIAGNOSIS — I700 Atherosclerosis of aorta: Secondary | ICD-10-CM

## 2021-04-30 DIAGNOSIS — E785 Hyperlipidemia, unspecified: Secondary | ICD-10-CM | POA: Diagnosis not present

## 2021-04-30 DIAGNOSIS — K219 Gastro-esophageal reflux disease without esophagitis: Secondary | ICD-10-CM

## 2021-04-30 DIAGNOSIS — I2583 Coronary atherosclerosis due to lipid rich plaque: Secondary | ICD-10-CM

## 2021-04-30 MED ORDER — VILAZODONE HCL 10 & 20 MG PO KIT: PACK | ORAL | 0 refills | Status: DC

## 2021-04-30 MED ORDER — VILAZODONE HCL 20 MG PO TABS: 20.0000 mg | ORAL_TABLET | Freq: Every day | ORAL | 5 refills | Status: DC

## 2021-04-30 NOTE — Assessment & Plan Note (Signed)
Cont w/ Omeprazole

## 2021-04-30 NOTE — Assessment & Plan Note (Addendum)
On Krill oil Check lipids, CMET Statin advised. Pt declined

## 2021-04-30 NOTE — Assessment & Plan Note (Signed)
Viibryd starter pack

## 2021-04-30 NOTE — Assessment & Plan Note (Signed)
Cont on Levothyroxine 

## 2021-04-30 NOTE — Progress Notes (Signed)
Subjective:  Patient ID: Jay Holmes, male    DOB: Sep 18, 1955  Age: 65 y.o. MRN: [id number]  CC: Anxiety   HPI Jay Holmes presents for CAD, hypothyroidism, GERD f/u C/o mood/anger issues Wife is using Vilazodone  Outpatient Medications Prior to Visit  Medication Sig Dispense Refill   aspirin 81 MG EC tablet Take 1 tablet (81 mg total) by mouth daily. Swallow whole. 30 tablet 12   bimatoprost (LUMIGAN) 0.01 % SOLN Place 1 drop into both eyes at bedtime.     Brimonidine Tartrate-Timolol (COMBIGAN OP) Apply 1 drop to eye 2 (two) times daily.      calcium gluconate 500 MG tablet Take 1 tablet by mouth daily.     Cholecalciferol (VITAMIN D3) 50 MCG (2000 UT) capsule Take 1 capsule (2,000 Units total) by mouth daily. 100 capsule 3   GLUCOSAMINE-CHONDROITIN PO Take 1,200 mg by mouth daily.     ketoconazole (NIZORAL) 2 % cream Apply 1 application topically daily as needed (rosacea).     levothyroxine (SYNTHROID) 100 MCG tablet Take 1 tablet (100 mcg total) by mouth daily. 90 tablet 3   MegaRed Omega-3 Krill Oil 500 MG CAPS Take 1 capsule by mouth every morning. 100 capsule 3   omeprazole (PRILOSEC) 40 MG capsule Take 1 capsule (40 mg total) by mouth daily. 90 capsule 3   No facility-administered medications prior to visit.    ROS: Review of Systems  Constitutional:  Negative for appetite change, fatigue and unexpected weight change.  HENT:  Negative for congestion, nosebleeds, sneezing, sore throat and trouble swallowing.   Eyes:  Negative for itching and visual disturbance.  Respiratory:  Negative for cough.   Cardiovascular:  Negative for chest pain, palpitations and leg swelling.  Gastrointestinal:  Negative for abdominal distention, blood in stool, diarrhea and nausea.  Genitourinary:  Negative for frequency and hematuria.  Musculoskeletal:  Negative for back pain, gait problem, joint swelling and neck pain.  Skin:  Negative for rash.  Neurological:  Negative for dizziness,  tremors, speech difficulty and weakness.  Psychiatric/Behavioral:  Positive for dysphoric mood. Negative for agitation, sleep disturbance and suicidal ideas. The patient is nervous/anxious.    Objective:  BP 109/70 (BP Location: Left Arm)    Pulse 71    Temp 97.8 F (36.6 C) (Oral)    Ht 5' 9.5" (1.765 m)    Wt 214 lb 9.6 oz (97.3 kg)    SpO2 99%    BMI 31.24 kg/m   BP Readings from Last 3 Encounters:  04/30/21 109/70  02/07/21 120/80  06/26/20 110/72    Wt Readings from Last 3 Encounters:  04/30/21 214 lb 9.6 oz (97.3 kg)  02/07/21 213 lb (96.6 kg)  10/12/19 208 lb (94.3 kg)    Physical Exam Constitutional:      General: He is not in acute distress.    Appearance: He is well-developed.     Comments: NAD  Eyes:     Conjunctiva/sclera: Conjunctivae normal.     Pupils: Pupils are equal, round, and reactive to light.  Neck:     Thyroid: No thyromegaly.     Vascular: No JVD.  Cardiovascular:     Rate and Rhythm: Normal rate and regular rhythm.     Heart sounds: Normal heart sounds. No murmur heard.   No friction rub. No gallop.  Pulmonary:     Effort: Pulmonary effort is normal. No respiratory distress.     Breath sounds: Normal breath sounds. No wheezing or  rales.  Chest:     Chest wall: No tenderness.  Abdominal:     General: Bowel sounds are normal. There is no distension.     Palpations: Abdomen is soft. There is no mass.     Tenderness: There is no abdominal tenderness. There is no guarding or rebound.  Musculoskeletal:        General: No tenderness. Normal range of motion.     Cervical back: Normal range of motion.  Lymphadenopathy:     Cervical: No cervical adenopathy.  Skin:    General: Skin is warm and dry.     Findings: No rash.  Neurological:     Mental Status: He is alert and oriented to person, place, and time.     Cranial Nerves: No cranial nerve deficit.     Motor: No abnormal muscle tone.     Coordination: Coordination normal.     Gait: Gait  normal.     Deep Tendon Reflexes: Reflexes are normal and symmetric.  Psychiatric:        Behavior: Behavior normal.        Thought Content: Thought content normal.        Judgment: Judgment normal.    Lab Results  Component Value Date   WBC 5.6 02/07/2021   HGB 15.2 02/07/2021   HCT 45.3 02/07/2021   PLT 248.0 02/07/2021   GLUCOSE 117 (H) 02/07/2021   CHOL 125 02/07/2021   TRIG 261.0 (H) 02/07/2021   HDL 31.30 (L) 02/07/2021   LDLDIRECT 74.0 02/07/2021   LDLCALC 69 07/19/2019   ALT 28 02/07/2021   AST 20 02/07/2021   NA 139 02/07/2021   K 4.3 02/07/2021   CL 104 02/07/2021   CREATININE 0.96 02/07/2021   BUN 17 02/07/2021   CO2 26 02/07/2021   TSH 3.11 02/07/2021   PSA 0.26 02/07/2021   INR 0.94 04/28/2018   HGBA1C 6.2 02/07/2021    CT CARDIAC SCORING (SELF PAY ONLY)  Addendum Date: 11/06/2020   ADDENDUM REPORT: 11/06/2020 13:09 CLINICAL DATA:  Cardiovascular Disease Risk stratification EXAM: Coronary Calcium Score TECHNIQUE: A gated, non-contrast computed tomography scan of the heart was performed using 66m slice thickness. Axial images were analyzed on a dedicated workstation. Calcium scoring of the coronary arteries was performed using the Agatston method. FINDINGS: Coronary Calcium Score: Left main: 0 Left anterior descending artery: 24.2 Left circumflex artery: 0 Right coronary artery: 58.5 Total: 82.7 Percentile: 49 Pericardium: Normal. Ascending Aorta: Normal caliber; aortic atherosclerosis noted. Non-cardiac: [facility]. IMPRESSION: Coronary calcium score of 82.7. This was 41percentile for age-, race-, and sex-matched controls. RECOMMENDATIONS: Coronary artery calcium (CAC) score is a strong predictor of incident coronary heart disease (CHD) and provides predictive information beyond traditional risk factors. CAC scoring is reasonable to use in the decision to withhold, postpone, or initiate statin therapy in intermediate-risk or  selected borderline-risk asymptomatic adults (age 66-75years and LDL-C >=70 to <190 mg/dL) who do not have diabetes or established atherosclerotic cardiovascular disease (ASCVD).* In intermediate-risk (10-year ASCVD risk >=7.5% to <20%) adults or selected borderline-risk (10-year ASCVD risk >=5% to <7.5%) adults in whom a CAC score is measured for the purpose of making a treatment decision the following recommendations have been made: If CAC=0, it is reasonable to withhold statin therapy and reassess in 5 to 10 years, as long as higher risk conditions are absent (diabetes mellitus, family history of premature CHD in first degree relatives (males <55 years; females <65 years), cigarette  smoking, or LDL >=190 mg/dL). If CAC is 1 to 99, it is reasonable to initiate statin therapy for patients >=22 years of age. If CAC is >=100 or >=75th percentile, it is reasonable to initiate statin therapy at any age. Cardiology referral should be considered for patients with CAC scores >=400 or >=75th percentile. *2018 AHA/ACC/AACVPR/AAPA/ABC/ACPM/ADA/AGS/APhA/ASPC/NLA/PCNA Guideline on the Management of Blood Cholesterol: A Report of the American College of Cardiology/American Heart Association Task Force on Clinical Practice Guidelines. J Am Coll Cardiol. 2019;73(24):3168-3209. Kirk Ruths, MD Electronically Signed   By: Kirk Ruths M.D.   On: 11/06/2020 13:09   Result Date: 11/06/2020 EXAM: OVER-READ INTERPRETATION  CT CHEST The following report is an over-read performed by radiologist Dr. Aletta Edouard of [facility], [place] on 11/06/2020. This over-read does not include interpretation of cardiac or coronary anatomy or pathology. The coronary calcium score interpretation by the cardiologist is attached. COMPARISON:  None. FINDINGS: Vascular: No significant noncardiac vascular findings. Mediastinum/Nodes: Visualized mediastinum and hilar regions demonstrate no lymphadenopathy or masses. Lungs/Pleura: 4 mm  subpleural nodule in the lateral left lower lobe on image 32/3 and 2.6 mm similar lateral subpleural nodule in the right lower lobe on image 28/3. Visualized lungs show no evidence of pulmonary edema, consolidation, pneumothorax or pleural fluid. Upper Abdomen: No acute abnormality. Musculoskeletal: No chest wall mass or suspicious bone lesions identified. IMPRESSION: 4 mm left lower lobe and 2.6 mm right lower lobe subpleural nodules. These are statistically most likely of postinflammatory origin. No follow-up needed if patient is low-risk (and has no known or suspected primary neoplasm). Non-contrast chest CT can be considered in 12 months if patient is high-risk. This recommendation follows the consensus statement: Guidelines for Management of Incidental Pulmonary Nodules Detected on CT Images: From the Fleischner Society 2017; Radiology 2017; 284:228-243. Electronically Signed: By: Aletta Edouard M.D. On: 11/06/2020 11:45    Assessment & Plan:   Problem List Items Addressed This Visit     Anxiety    Viibryd starter pack      Relevant Medications   Vilazodone HCl 10 & 20 MG KIT   Vilazodone HCl 20 MG TABS   Atherosclerosis of aorta (HCC)    On Krill oil Statin advised. Pt declined      CAD (coronary artery disease)    On Krill oil Check lipids, CMET Statin advised. Pt declined      GERD    Cont w/ Omeprazole      Hypothyroidism    Cont on Levothyroxine       Other Visit Diagnoses     Dyslipidemia    -  Primary   Relevant Orders   Comprehensive metabolic panel   Lipid panel         Meds ordered this encounter  Medications   Vilazodone HCl 10 & 20 MG KIT    Sig: As directed    Dispense:  1 kit    Refill:  0   Vilazodone HCl 20 MG TABS    Sig: Take 1 tablet (20 mg total) by mouth daily.    Dispense:  30 tablet    Refill:  5      Follow-up: Return in about 6 months (around 10/29/2021) for a follow-up visit.  Walker Kehr, MD

## 2021-04-30 NOTE — Assessment & Plan Note (Addendum)
On Krill oil Statin advised. Pt declined

## 2021-05-15 ENCOUNTER — Telehealth: Payer: Self-pay | Admitting: Internal Medicine

## 2021-05-15 DIAGNOSIS — N200 Calculus of kidney: Secondary | ICD-10-CM

## 2021-05-15 NOTE — Telephone Encounter (Signed)
Patient requesting provider's advice on how to pass a kidney stone   Patient states he has drunk gallons of water in the past 3 hours w/ no relief  Patient states he is in a lot of pain  Please advise

## 2021-05-16 NOTE — Telephone Encounter (Signed)
Pls go to ER  It could be something else Thx

## 2021-05-16 NOTE — Telephone Encounter (Signed)
Called and informed patient of provider's recommendation  Patient states he has "passed the stone" and would like to request a biopsy on the "stone"  Please advise

## 2021-05-19 NOTE — Telephone Encounter (Signed)
Okay.  Order is in.  Please schedule AP follow-up visit.  Thanks

## 2021-05-20 ENCOUNTER — Other Ambulatory Visit (INDEPENDENT_AMBULATORY_CARE_PROVIDER_SITE_OTHER): Payer: Medicare Other

## 2021-05-20 DIAGNOSIS — E785 Hyperlipidemia, unspecified: Secondary | ICD-10-CM

## 2021-05-20 DIAGNOSIS — N200 Calculus of kidney: Secondary | ICD-10-CM

## 2021-05-20 LAB — COMPREHENSIVE METABOLIC PANEL
ALT: 30 U/L (ref 0–53)
AST: 22 U/L (ref 0–37)
Albumin: 4.2 g/dL (ref 3.5–5.2)
Alkaline Phosphatase: 107 U/L (ref 39–117)
BUN: 18 mg/dL (ref 6–23)
CO2: 30 mEq/L (ref 19–32)
Calcium: 9.1 mg/dL (ref 8.4–10.5)
Chloride: 104 mEq/L (ref 96–112)
Creatinine, Ser: 1.07 mg/dL (ref 0.40–1.50)
GFR: 72.2 mL/min (ref >60.00)
Glucose, Bld: 119 mg/dL — ABNORMAL HIGH (ref 70–99)
Potassium: 5.1 mEq/L (ref 3.5–5.1)
Sodium: 139 mEq/L (ref 135–145)
Total Bilirubin: 0.6 mg/dL (ref 0.2–1.2)
Total Protein: 7.2 g/dL (ref 6.0–8.3)

## 2021-05-20 LAB — LIPID PANEL
Cholesterol: 134 mg/dL (ref 0–200)
HDL: 39.7 mg/dL (ref >39.00)
LDL Cholesterol: 81 mg/dL (ref 0–99)
NonHDL: 93.81
Total CHOL/HDL Ratio: 3
Triglycerides: 62 mg/dL (ref 0.0–149.0)
VLDL: 12.4 mg/dL (ref 0.0–40.0)

## 2021-05-20 NOTE — Telephone Encounter (Signed)
Notified pt w/ MD response. Pt will take stone to [facility].Marland KitchenJohny Holmes

## 2021-05-25 LAB — STONE ANALYSIS: Stone Weight: 0.009 g

## 2021-05-26 ENCOUNTER — Encounter: Payer: Self-pay | Admitting: Internal Medicine

## 2021-05-26 DIAGNOSIS — N200 Calculus of kidney: Secondary | ICD-10-CM | POA: Insufficient documentation

## 2021-06-22 ENCOUNTER — Other Ambulatory Visit: Payer: Self-pay | Admitting: Internal Medicine

## 2021-06-22 DIAGNOSIS — E039 Hypothyroidism, unspecified: Secondary | ICD-10-CM

## 2021-07-03 ENCOUNTER — Telehealth: Payer: Self-pay | Admitting: Internal Medicine

## 2021-07-03 NOTE — Telephone Encounter (Signed)
1.Medication Requested: Vilazodone HCl 20 MG TABS  2. Pharmacy (Name, [place], [place]): [facility] ([place], [place]  Phone:  [phone] Fax:  [phone]   3. On Med List: yes  4. Last Visit with PCP: 12.20.22  5. Next visit date with PCP: 03.30.23   Agent: Please be advised that RX refills may take up to 3 business days. We ask that you follow-up with your pharmacy.

## 2021-07-05 MED ORDER — VILAZODONE HCL 20 MG PO TABS: 20.0000 mg | ORAL_TABLET | Freq: Every day | ORAL | 1 refills | Status: DC

## 2021-07-05 NOTE — Telephone Encounter (Signed)
Okay.  Thanks.

## 2021-07-10 ENCOUNTER — Other Ambulatory Visit: Payer: Self-pay

## 2021-07-15 NOTE — Telephone Encounter (Addendum)
Pt is requesting that this Vilazondone HCI '20mg'$  be sent to mail order.  ? ?Pharmacy: ?[facility] [place] ? ?Pt didn't pick up from [place]. Only wants mail order. ? ?Pt only has one pill remaining. ? ?Pt wife is wanting CB to let them know when it has been sent to the mail order pharmacy. ? ?Pt [phone] ?

## 2021-07-15 NOTE — Telephone Encounter (Signed)
Patient spouse calling back in ? ?Calling to see if refill request had been completed & sent to mail order ? ?Please fu [phone] ?

## 2021-07-16 MED ORDER — VILAZODONE HCL 20 MG PO TABS: 20.0000 mg | ORAL_TABLET | Freq: Every day | ORAL | 1 refills | Status: DC

## 2021-07-16 NOTE — Addendum Note (Signed)
Addended by: Cassandria Anger on: 07/16/2021 07:40 AM ? ? Modules accepted: Orders ? ?

## 2021-07-16 NOTE — Telephone Encounter (Signed)
Okay.  We will resend.  This prescription was filled on 07/05/2021.  Thanks ?

## 2021-08-08 ENCOUNTER — Ambulatory Visit: Payer: Medicare Other | Admitting: Internal Medicine

## 2021-08-21 ENCOUNTER — Encounter: Payer: Self-pay | Admitting: Internal Medicine

## 2021-08-21 ENCOUNTER — Ambulatory Visit (INDEPENDENT_AMBULATORY_CARE_PROVIDER_SITE_OTHER): Payer: Medicare Other | Admitting: Internal Medicine

## 2021-08-21 DIAGNOSIS — F419 Anxiety disorder, unspecified: Secondary | ICD-10-CM

## 2021-08-21 DIAGNOSIS — R739 Hyperglycemia, unspecified: Secondary | ICD-10-CM | POA: Diagnosis not present

## 2021-08-21 DIAGNOSIS — I2583 Coronary atherosclerosis due to lipid rich plaque: Secondary | ICD-10-CM

## 2021-08-21 DIAGNOSIS — I2510 Atherosclerotic heart disease of native coronary artery without angina pectoris: Secondary | ICD-10-CM | POA: Diagnosis not present

## 2021-08-21 DIAGNOSIS — R918 Other nonspecific abnormal finding of lung field: Secondary | ICD-10-CM | POA: Diagnosis not present

## 2021-08-21 DIAGNOSIS — E039 Hypothyroidism, unspecified: Secondary | ICD-10-CM

## 2021-08-21 MED ORDER — VILAZODONE HCL 20 MG PO TABS: 20.0000 mg | ORAL_TABLET | Freq: Every day | ORAL | 1 refills | Status: DC

## 2021-08-21 MED ORDER — VITAMIN D3 50 MCG (2000 UT) PO CAPS: 2000.0000 [IU] | ORAL_CAPSULE | Freq: Every day | ORAL | 3 refills | Status: DC

## 2021-08-21 NOTE — Progress Notes (Signed)
? ?Subjective:  ?Patient ID: Jay Holmes, male    DOB: [birth year]  Age: 66 y.o. MRN: [id number] ? ?CC: 6 month f/u (He would like to discuss should he be taking Vitamin D, Calcium and ASA. He also would like to discuss his Vilazodone. ) ? ? ?HPI ?Jay Holmes presents for depression - needs Vilazodone refill, hypothyroidism, CAD ? ?Outpatient Medications Prior to Visit  ?Medication Sig Dispense Refill  ? bimatoprost (LUMIGAN) 0.01 % SOLN Place 1 drop into both eyes at bedtime.    ? Brimonidine Tartrate-Timolol (COMBIGAN OP) Apply 1 drop to eye 2 (two) times daily.     ? ketoconazole (NIZORAL) 2 % cream Apply 1 application topically daily as needed (rosacea).    ? levothyroxine (SYNTHROID) 100 MCG tablet TAKE 1 TABLET BY MOUTH  DAILY 90 tablet 3  ? MegaRed Omega-3 Krill Oil 500 MG CAPS Take 1 capsule by mouth every morning. 100 capsule 3  ? omeprazole (PRILOSEC) 40 MG capsule Take 1 capsule (40 mg total) by mouth daily. 90 capsule 3  ? Vilazodone HCl 20 MG TABS Take 1 tablet (20 mg total) by mouth daily. 90 tablet 1  ? aspirin 81 MG EC tablet Take 1 tablet (81 mg total) by mouth daily. Swallow whole. 30 tablet 12  ? calcium gluconate 500 MG tablet Take 1 tablet by mouth daily.    ? Cholecalciferol (VITAMIN D3) 50 MCG (2000 UT) capsule Take 1 capsule (2,000 Units total) by mouth daily. 100 capsule 3  ? GLUCOSAMINE-CHONDROITIN PO Take 1,200 mg by mouth daily.    ? ?No facility-administered medications prior to visit.  ? ? ?ROS: ?Review of Systems  ?Constitutional:  Negative for appetite change, fatigue and unexpected weight change.  ?HENT:  Negative for congestion, nosebleeds, sneezing, sore throat and trouble swallowing.   ?Eyes:  Negative for itching and visual disturbance.  ?Respiratory:  Negative for cough.   ?Cardiovascular:  Negative for chest pain, palpitations and leg swelling.  ?Gastrointestinal:  Negative for abdominal distention, blood in stool, diarrhea and nausea.  ?Genitourinary:  Negative for  frequency and hematuria.  ?Musculoskeletal:  Negative for back pain, gait problem, joint swelling and neck pain.  ?Skin:  Negative for rash.  ?Neurological:  Negative for dizziness, tremors, speech difficulty and weakness.  ?Psychiatric/Behavioral:  Negative for agitation, dysphoric mood and sleep disturbance. The patient is nervous/anxious.   ? ?Objective:  ?BP 124/70   Pulse 80   Temp 97.8 ?F (36.6 ?C) (Oral)   Ht 5' 9.5" (1.765 m)   Wt 213 lb 9.6 oz (96.9 kg)   SpO2 95%   BMI 31.09 kg/m?  ? ?BP Readings from Last 3 Encounters:  ?08/21/21 124/70  ?04/30/21 109/70  ?02/07/21 120/80  ? ? ?Wt Readings from Last 3 Encounters:  ?08/21/21 213 lb 9.6 oz (96.9 kg)  ?04/30/21 214 lb 9.6 oz (97.3 kg)  ?02/07/21 213 lb (96.6 kg)  ? ? ?Physical Exam ?Constitutional:   ?   General: He is not in acute distress. ?   Appearance: He is well-developed. He is obese.  ?   Comments: NAD  ?Eyes:  ?   Conjunctiva/sclera: Conjunctivae normal.  ?   Pupils: Pupils are equal, round, and reactive to light.  ?Neck:  ?   Thyroid: No thyromegaly.  ?   Vascular: No JVD.  ?Cardiovascular:  ?   Rate and Rhythm: Normal rate and regular rhythm.  ?   Heart sounds: Normal heart sounds. No murmur heard. ?  No friction  rub. No gallop.  ?Pulmonary:  ?   Effort: Pulmonary effort is normal. No respiratory distress.  ?   Breath sounds: Normal breath sounds. No wheezing or rales.  ?Chest:  ?   Chest wall: No tenderness.  ?Abdominal:  ?   General: Bowel sounds are normal. There is no distension.  ?   Palpations: Abdomen is soft. There is no mass.  ?   Tenderness: There is no abdominal tenderness. There is no guarding or rebound.  ?Musculoskeletal:     ?   General: No tenderness. Normal range of motion.  ?   Cervical back: Normal range of motion.  ?Lymphadenopathy:  ?   Cervical: No cervical adenopathy.  ?Skin: ?   General: Skin is warm and dry.  ?   Findings: No rash.  ?Neurological:  ?   Mental Status: He is alert and oriented to person, place, and  time.  ?   Cranial Nerves: No cranial nerve deficit.  ?   Motor: No abnormal muscle tone.  ?   Coordination: Coordination normal.  ?   Gait: Gait normal.  ?   Deep Tendon Reflexes: Reflexes are normal and symmetric.  ?Psychiatric:     ?   Behavior: Behavior normal.     ?   Thought Content: Thought content normal.     ?   Judgment: Judgment normal.  ? ? ?Lab Results  ?Component Value Date  ? WBC 5.6 02/07/2021  ? HGB 15.2 02/07/2021  ? HCT 45.3 02/07/2021  ? PLT 248.0 02/07/2021  ? GLUCOSE 119 (H) 05/20/2021  ? CHOL 134 05/20/2021  ? TRIG 62.0 05/20/2021  ? HDL 39.70 05/20/2021  ? LDLDIRECT 74.0 02/07/2021  ? [place] 81 05/20/2021  ? ALT 30 05/20/2021  ? AST 22 05/20/2021  ? NA 139 05/20/2021  ? K 5.1 05/20/2021  ? CL 104 05/20/2021  ? CREATININE 1.07 05/20/2021  ? BUN 18 05/20/2021  ? CO2 30 05/20/2021  ? TSH 3.11 02/07/2021  ? PSA 0.26 02/07/2021  ? INR 0.94 04/28/2018  ? HGBA1C 6.2 02/07/2021  ? ? ?CT CARDIAC SCORING (SELF PAY ONLY) ? ?Addendum Date: 11/06/2020   ?ADDENDUM REPORT: 11/06/2020 13:09 CLINICAL DATA:  Cardiovascular Disease Risk stratification EXAM: Coronary Calcium Score TECHNIQUE: A gated, non-contrast computed tomography scan of the heart was performed using 51m slice thickness. Axial images were analyzed on a dedicated workstation. Calcium scoring of the coronary arteries was performed using the Agatston method. FINDINGS: Coronary Calcium Score: Left main: 0 Left anterior descending artery: 24.2 Left circumflex artery: 0 Right coronary artery: 58.5 Total: 82.7 Percentile: 49 Pericardium: Normal. Ascending Aorta: Normal caliber; aortic atherosclerosis noted. [facility]-cardiac: [facility]. IMPRESSION: Coronary calcium score of 82.7. This was 450percentile for age-, race-, and sex-matched controls. RECOMMENDATIONS: Coronary artery calcium (CAC) score is a strong predictor of incident coronary heart disease (CHD) and provides predictive information beyond traditional risk  factors. CAC scoring is reasonable to use in the decision to withhold, postpone, or initiate statin therapy in intermediate-risk or selected borderline-risk asymptomatic adults (age 67-75years and LDL-C >=70 to <190 mg/dL) who do not have diabetes or established atherosclerotic cardiovascular disease (ASCVD).* In intermediate-risk (10-year ASCVD risk >=7.5% to <20%) adults or selected borderline-risk (10-year ASCVD risk >=5% to <7.5%) adults in whom a CAC score is measured for the purpose of making a treatment decision the following recommendations have been made: If CAC=0, it is reasonable to withhold statin therapy and reassess in 5 to 10  years, as long as higher risk conditions are absent (diabetes mellitus, family history of premature CHD in first degree relatives (males <55 years; females <65 years), cigarette smoking, or LDL >=190 mg/dL). If CAC is 1 to 99, it is reasonable to initiate statin therapy for patients >=64 years of age. If CAC is >=100 or >=75th percentile, it is reasonable to initiate statin therapy at any age. Cardiology referral should be considered for patients with CAC scores >=400 or >=75th percentile. *2018 AHA/ACC/AACVPR/AAPA/ABC/ACPM/ADA/AGS/APhA/ASPC/NLA/PCNA Guideline on the Management of Blood Cholesterol: A Report of the American College of Cardiology/American Heart Association Task Force on Clinical Practice Guidelines. J Am Coll Cardiol. 2019;73(24):3168-3209. Kirk Ruths, MD Electronically Signed   By: Kirk Ruths M.D.   On: 11/06/2020 13:09  ? ?Result Date: 11/06/2020 ?EXAM: OVER-READ INTERPRETATION  CT CHEST The following report is an over-read performed by radiologist Dr. Aletta Edouard of [facility], [place] on 11/06/2020. This over-read does not include interpretation of cardiac or coronary anatomy or pathology. The coronary calcium score interpretation by the cardiologist is attached. COMPARISON:  None. FINDINGS: Vascular: No significant noncardiac vascular  findings. Mediastinum/Nodes: Visualized mediastinum and hilar regions demonstrate no lymphadenopathy or masses. Lungs/Pleura: 4 mm subpleural nodule in the lateral left lower lobe on image 32/3 and 2.6 mm similar lateral subpl

## 2021-08-21 NOTE — Assessment & Plan Note (Signed)
We may need to repeat CT in 3-6 months ?

## 2021-08-21 NOTE — Assessment & Plan Note (Signed)
Cont on Levothyroxine 

## 2021-08-21 NOTE — Patient Instructions (Signed)
Sign up for [employer] ( via [employer] on your phone or your ipad). If you don't have a Art therapist card  - go to [employer] branch. They will set you up in 15 minutes. It is free. You can check out books to read and to listen, check out magazines and newspapers, movies etc. ? ? ?The Obesity Code book by Sharman Cheek ? ? ?These suggestions will probably help you to improve your metabolism if you are not overweight and to lose weight if you are overweight: ?1.  Reduce your consumption of sugars and starches.  Eliminate high fructose corn syrup from your diet.  Reduce your consumption of processed foods.  For desserts try to have seasonal fruits, berries, nuts, cheeses or dark chocolate with more than 70% cacao. ?2.  Do not snack ?3.  You do not have to eat breakfast.  If you choose to have breakfast - eat plain greek yogurt, eggs, oatmeal (without sugar) - use honey if you need to. ?4.  Drink water, freshly brewed unsweetened tea (green, black or herbal) or coffee.  Do not drink sodas including diet sodas , juices, beverages sweetened with artificial sweeteners. ?5.  Reduce your consumption of refined grains. ?6.  Avoid protein drinks such as Optifast, Slim fast etc. Eat chicken, fish, meat, dairy and beans for your sources of protein. ?7.  Natural unprocessed fats like cold pressed virgin olive oil, butter, coconut oil are good for you.  Eat avocados. ?8.  Increase your consumption of fiber.  Fruits, berries, vegetables, whole grains, flaxseed, chia seeds, beans, popcorn, nuts, oatmeal are good sources of fiber ?9.  Use vinegar in your diet, i.e. apple cider vinegar, red wine or balsamic vinegar ?10.  You can try fasting.  For example you can skip breakfast and lunch every other day (24-hour fast) ?11.  Stress reduction, good night sleep, relaxation, meditation, yoga and other physical activity is likely to help you to maintain low weight too. ?12.  If you drink alcohol, limit your alcohol intake to no more than  2 drinks a day. ? ?Jeneen Rinks [employer]'s books ? ?

## 2021-08-21 NOTE — Assessment & Plan Note (Signed)
On Krill oil 

## 2021-08-21 NOTE — Assessment & Plan Note (Signed)
Check A1c ?Wt loss adviced ?

## 2021-08-21 NOTE — Assessment & Plan Note (Signed)
Doing well: Viibryd to continue ?

## 2021-09-24 ENCOUNTER — Other Ambulatory Visit: Payer: Self-pay | Admitting: Internal Medicine

## 2021-09-24 DIAGNOSIS — K219 Gastro-esophageal reflux disease without esophagitis: Secondary | ICD-10-CM

## 2021-09-25 ENCOUNTER — Telehealth: Payer: Self-pay

## 2021-09-25 DIAGNOSIS — K219 Gastro-esophageal reflux disease without esophagitis: Secondary | ICD-10-CM

## 2021-09-25 MED ORDER — OMEPRAZOLE 40 MG PO CPDR: 40.0000 mg | DELAYED_RELEASE_CAPSULE | Freq: Every day | ORAL | 3 refills | Status: DC

## 2021-09-25 NOTE — Telephone Encounter (Signed)
Pt is requesting refill on: ?Omeprazole (PRILOSEC) 40 MG capsule ? ?Pharmacy: ?[facility] ([place], [place] ? ?LOV 08/21/21 ?ROV 02/20/22 ?

## 2021-09-25 NOTE — Telephone Encounter (Signed)
Sent refill to requested pharmacy,,/lmb ?

## 2021-12-30 ENCOUNTER — Telehealth: Payer: Self-pay | Admitting: Internal Medicine

## 2021-12-30 NOTE — Telephone Encounter (Signed)
LVM for pt to rtn my call to schedule AWV with [facility] call back # [phone] 

## 2021-12-30 NOTE — Telephone Encounter (Signed)
Patient rtn my call to schedule AWV with [facility]. He didn't want to schedule appt with me because he said I did not work for the office.He did not understand why he had an appt in Oct. I informed him that the appt in Oct was scheduled on 08/21/21 for a 6 mo fu was not happy because he said no one informed him of this.He states he will call the office.

## 2022-01-06 ENCOUNTER — Telehealth: Payer: Self-pay | Admitting: Internal Medicine

## 2022-01-06 MED ORDER — VILAZODONE HCL 20 MG PO TABS: 20.0000 mg | ORAL_TABLET | Freq: Every day | ORAL | 1 refills | Status: DC

## 2022-01-06 NOTE — Telephone Encounter (Signed)
Adonis Huguenin, pharmacist with [facility] called and stated that patient's RX for Vilazodone HCl 20 MG TABS never arrived. They are requesting a new RX. Call back number is [phone]  [facility] ([place], [place]

## 2022-01-06 NOTE — Telephone Encounter (Signed)
[facility] to [facility].Marland KitchenJohny Holmes

## 2022-02-05 IMAGING — CT CT CARDIAC CORONARY ARTERY CALCIUM SCORE
3 series · 14 of 20 positions shown, 15 images · non-contrast
Comparison: None.
COMPARISON: None.

Addendum:
EXAM:
OVER-READ INTERPRETATION  CT CHEST

The following report is an over-read performed by radiologist Dr.
Casper Bueso [REDACTED] on 11/06/2020. This
over-read does not include interpretation of cardiac or coronary
anatomy or pathology. The coronary calcium score interpretation by
the cardiologist is attached.
TECHNIQUE: A gated, non-contrast computed tomography scan of the heart was
performed using 3mm slice thickness. Axial images were analyzed on a
dedicated workstation. Calcium scoring of the coronary arteries was
performed using the Agatston method.

[Series 2: casc 3.0 bv41 2 bestdiast 78 % · axial · 0.50mm/px · z∈[-289,-208]mm · 4 of 46 slices shown, 5 images]
[im 10/46  vessel]
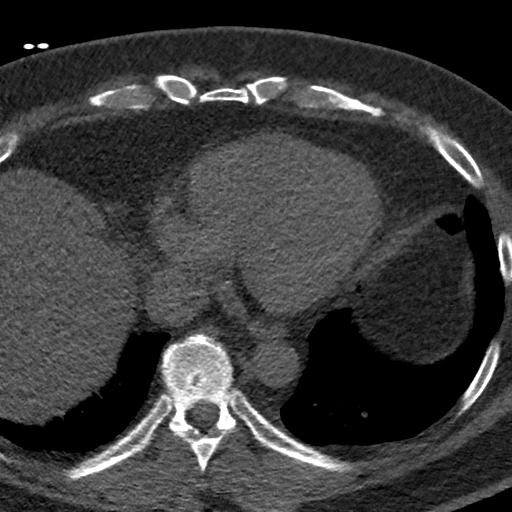
[im 10/46  lung]
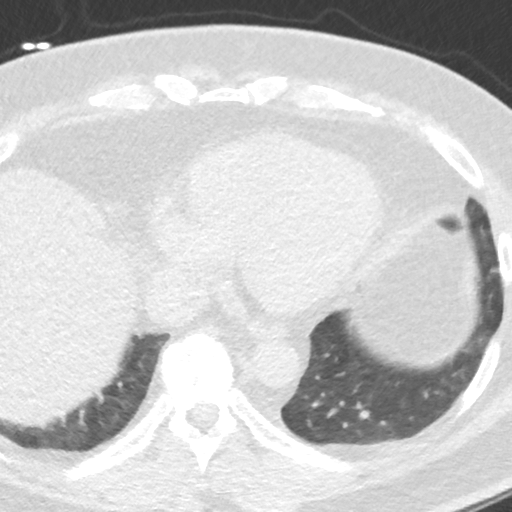
[im 19/46  vessel]
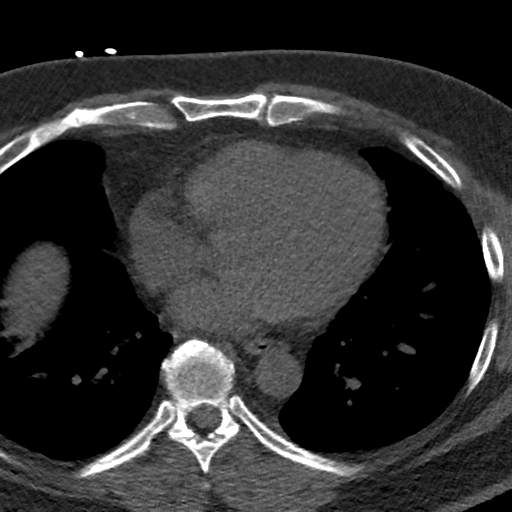
[im 28/46  vessel]
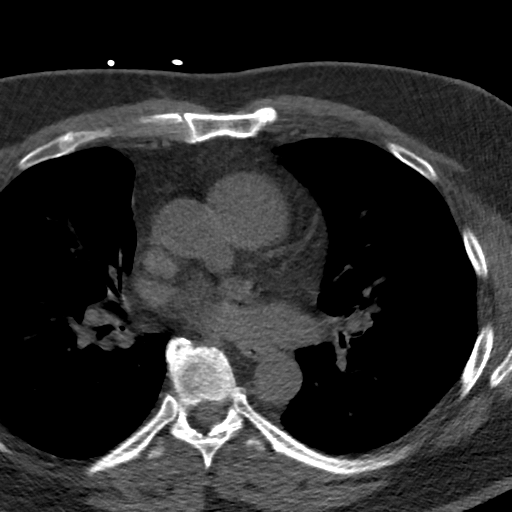
[im 37/46  vessel]
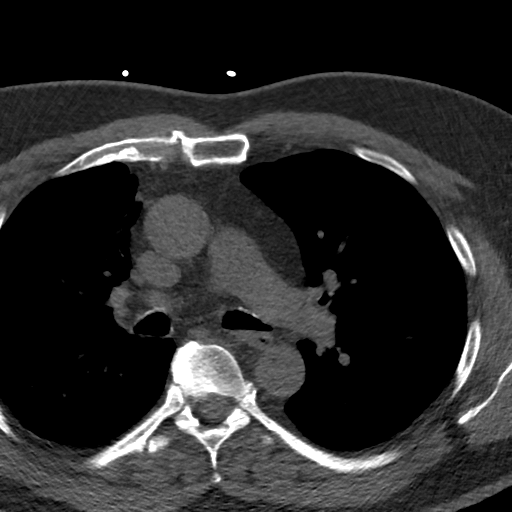

[Series 3: lung 78 % · axial · 0.68mm/px · z∈[-295,-205]mm · 5 of 46 slices shown]
[im 8/46  lung]
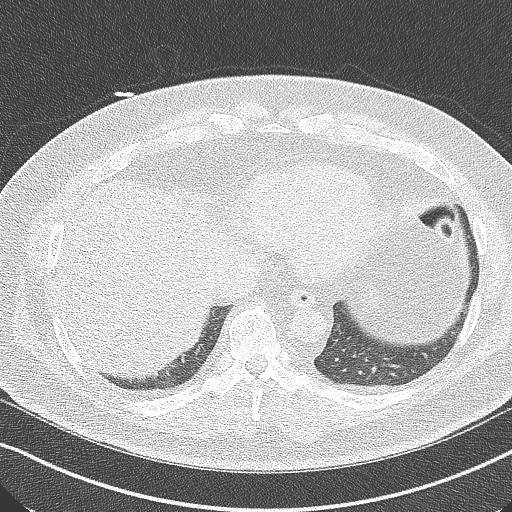
[im 16/46  lung]
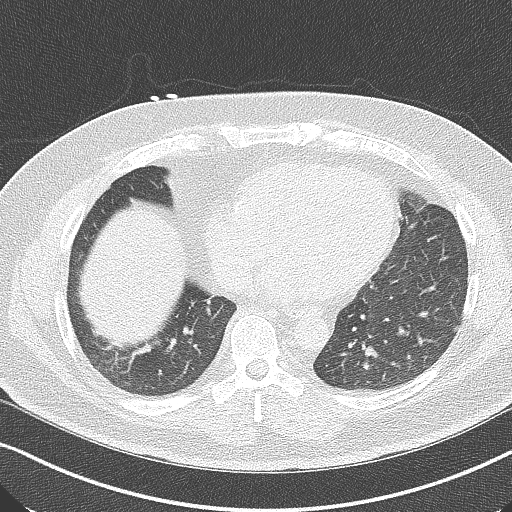
[im 23/46  lung]
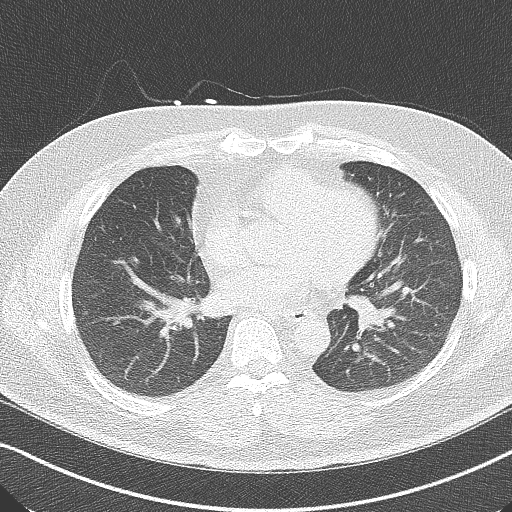
[im 31/46  lung]
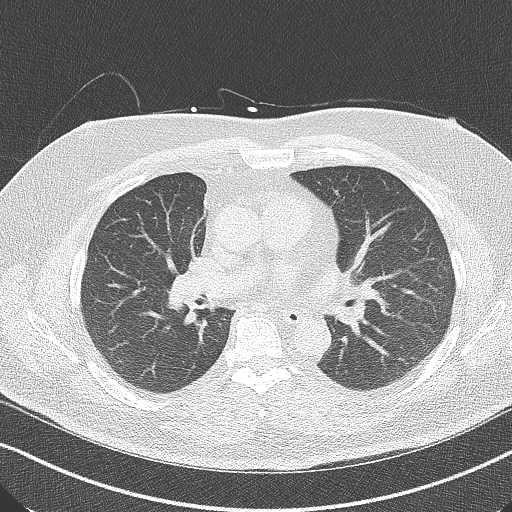
[im 38/46  lung]
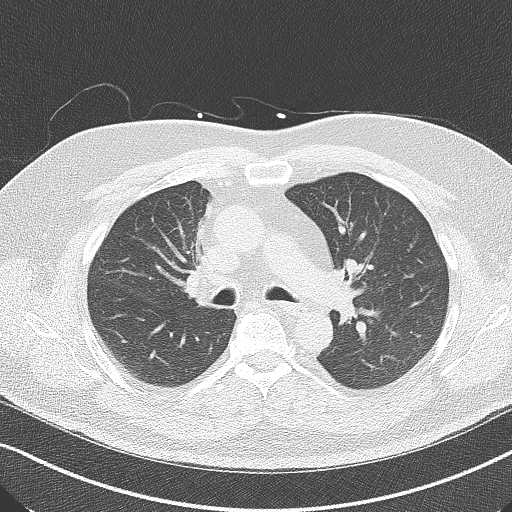

[Series 4: lung st 78 % · axial · 0.68mm/px · z∈[-295,-205]mm · 5 of 46 slices shown]
[im 8/46  lung]
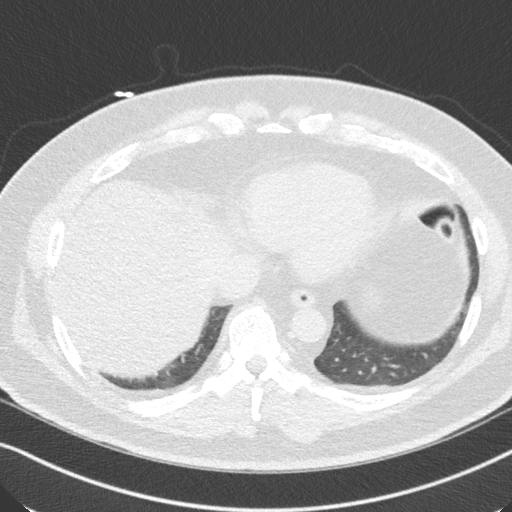
[im 16/46  lung]
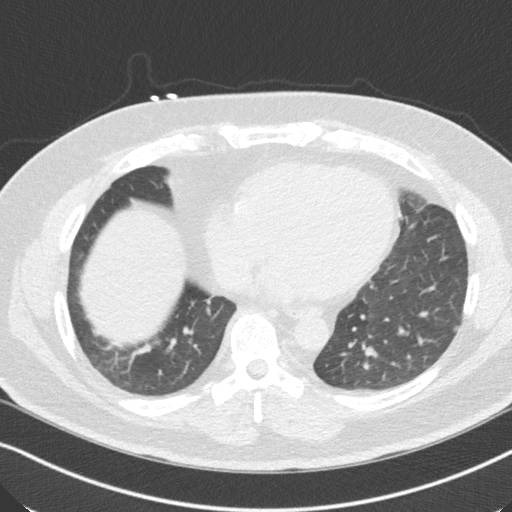
[im 23/46  lung]
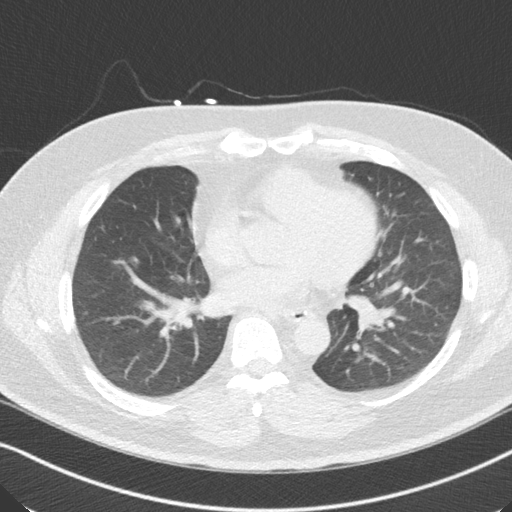
[im 31/46  lung]
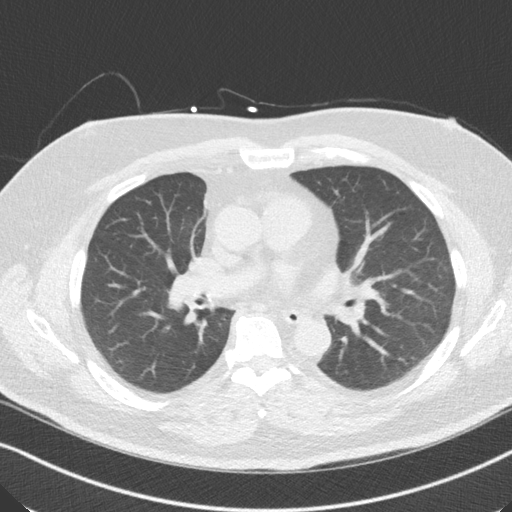
[im 38/46  lung]
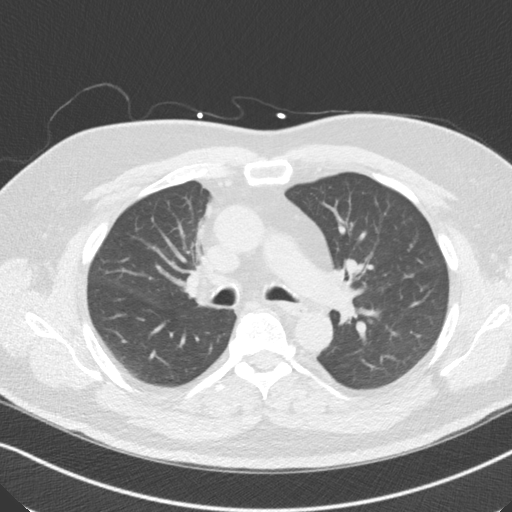

[14 of 20 positions shown; findings below may reference images not displayed]

FINDINGS: Vascular: No significant noncardiac vascular findings.

Mediastinum/Nodes: Visualized mediastinum and hilar regions
demonstrate no lymphadenopathy or masses.

Lungs/Pleura: 4 mm subpleural nodule in the lateral left lower lobe
on image 32/3 and 2.6 mm similar lateral subpleural nodule in the
right lower lobe on image [DATE]. Visualized lungs show no evidence of
pulmonary edema, consolidation, pneumothorax or pleural fluid.

Upper Abdomen: No acute abnormality.

Musculoskeletal: No chest wall mass or suspicious bone lesions
identified.
IMPRESSION: 4 mm left lower lobe and 2.6 mm right lower lobe subpleural nodules.
These are statistically most likely of postinflammatory origin. No
follow-up needed if patient is low-risk (and has no known or
suspected primary neoplasm). Non-contrast chest CT can be considered
in 12 months if patient is high-risk. This recommendation follows
the consensus statement: Guidelines for Management of Incidental
Pulmonary Nodules Detected on CT Images: From the [REDACTED]AL DATA:  Cardiovascular Disease Risk stratification

EXAM:
Coronary Calcium Score
FINDINGS: Coronary Calcium Score:

Left main: 0

Left anterior descending artery:

Left circumflex artery: 0

Right coronary artery:

Total:

Percentile: 49

Pericardium: Normal.

Ascending Aorta: Normal caliber; aortic atherosclerosis noted.

Non-cardiac: See separate report from [REDACTED].
IMPRESSION: Coronary calcium score of 82.7. This was 49 percentile for age-,
race-, and sex-matched controls.



If CAC=0, it is reasonable to withhold statin therapy and reassess
in 5 to 10 years, as long as higher risk conditions are absent
(diabetes mellitus, family history of premature CHD in first degree
relatives (males <55 years; females <65 years), cigarette smoking,
or LDL >=190 mg/dL).

If CAC is 1 to 99, it is reasonable to initiate statin therapy for
patients >=55 years of age.

If CAC is >=100 or >=75th percentile, it is reasonable to initiate
statin therapy at any age.

Cardiology referral should be considered for patients with CAC
scores >=400 or >=75th percentile.

*8250 AHA/ACC/AACVPR/AAPA/ABC/MRASORI/ETOIL/ESPINOZA/Anslem/MYNOR ESTUARDO/VARDHAN/SHERMI HER
Guideline on the Management of Blood Cholesterol: A Report of the
American College of Cardiology/American Heart Association Task Force
on Clinical Practice Guidelines. J Am Coll Cardiol.
5590;73(24):3816-3679.

*** End of Addendum ***
EXAM:
OVER-READ INTERPRETATION  CT CHEST

The following report is an over-read performed by radiologist Dr.
Casper Bueso [REDACTED] on 11/06/2020. This
over-read does not include interpretation of cardiac or coronary
anatomy or pathology. The coronary calcium score interpretation by
the cardiologist is attached.
FINDINGS: Vascular: No significant noncardiac vascular findings.

Mediastinum/Nodes: Visualized mediastinum and hilar regions
demonstrate no lymphadenopathy or masses.

Lungs/Pleura: 4 mm subpleural nodule in the lateral left lower lobe
on image 32/3 and 2.6 mm similar lateral subpleural nodule in the
right lower lobe on image [DATE]. Visualized lungs show no evidence of
pulmonary edema, consolidation, pneumothorax or pleural fluid.

Upper Abdomen: No acute abnormality.

Musculoskeletal: No chest wall mass or suspicious bone lesions
identified.
IMPRESSION: 4 mm left lower lobe and 2.6 mm right lower lobe subpleural nodules.
These are statistically most likely of postinflammatory origin. No
follow-up needed if patient is low-risk (and has no known or
suspected primary neoplasm). Non-contrast chest CT can be considered
in 12 months if patient is high-risk. This recommendation follows
the consensus statement: Guidelines for Management of Incidental
Pulmonary Nodules Detected on CT Images: From the [HOSPITAL]

## 2022-02-17 ENCOUNTER — Ambulatory Visit (INDEPENDENT_AMBULATORY_CARE_PROVIDER_SITE_OTHER): Payer: Medicare Other

## 2022-02-17 VITALS — Ht 67.0 in | Wt 213.0 lb

## 2022-02-17 DIAGNOSIS — Z0000 Encounter for general adult medical examination without abnormal findings: Secondary | ICD-10-CM

## 2022-02-17 NOTE — Progress Notes (Addendum)
Subjective:   Jay Holmes is a 66 y.o. male who presents for Medicare Annual/Subsequent preventive examination.   Virtual Visit via Telephone Note  I connected with  KAULIN CHAVES on 02/17/22 at  1:00 PM EDT by telephone and verified that I am speaking with the correct person using two identifiers.  Location: Patient: home  Provider: Esmond Plants  Persons participating in the virtual visit: [place]   I discussed the limitations, risks, security and privacy concerns of performing an evaluation and management service by telephone and the availability of in person appointments. The patient expressed understanding and agreed to proceed.  Interactive audio and video telecommunications were attempted between this nurse and patient, however failed, due to patient having technical difficulties OR patient did not have access to video capability.  We continued and completed visit with audio only.  Some vital signs may be absent or patient reported.   Daphane Shepherd, LPN  Review of Systems     Cardiac Risk Factors include: advanced age (>44mn, >>20women);male gender     Objective:    Today's Vitals   02/17/22 1308  Weight: 213 lb (96.6 kg)  Height: '5\' 7"'$  (1.702 m)   Body mass index is 33.36 kg/m.     02/17/2022    1:14 PM 01/08/2021    9:59 AM 05/03/2018    2:25 PM 04/28/2018   11:06 AM 10/29/2016    8:32 AM 08/07/2015    9:39 PM  Advanced Directives  Does Patient Have a Medical Advance Directive? Yes Yes No No No No  Type of AParamedicof ALa HuertaLiving will HChandlerLiving will      Does patient want to make changes to medical advance directive?  No - Patient declined      Copy of HPickeringtonin Chart? No - copy requested No - copy requested      Would patient like information on creating a medical advance directive?   No - Patient declined No - Patient declined      Current Medications  (verified) Outpatient Encounter Medications as of 02/17/2022  Medication Sig   bimatoprost (LUMIGAN) 0.01 % SOLN Place 1 drop into both eyes at bedtime.   Brimonidine Tartrate-Timolol (COMBIGAN OP) Apply 1 drop to eye 2 (two) times daily.    Cholecalciferol (VITAMIN D3) 50 MCG (2000 UT) capsule Take 1 capsule (2,000 Units total) by mouth daily.   ketoconazole (NIZORAL) 2 % cream Apply 1 application topically daily as needed (rosacea).   levothyroxine (SYNTHROID) 100 MCG tablet TAKE 1 TABLET BY MOUTH  DAILY   MegaRed Omega-3 Krill Oil 500 MG CAPS Take 1 capsule by mouth every morning.   omeprazole (PRILOSEC) 40 MG capsule Take 1 capsule (40 mg total) by mouth daily.   Vilazodone HCl 20 MG TABS Take 1 tablet (20 mg total) by mouth daily.   No facility-administered encounter medications on file as of 02/17/2022.    Allergies (verified) Patient has no known allergies.   History: Past Medical History:  Diagnosis Date   Arthritis    Diastasis recti 07/21/2012   questionable umbilical hernia   Glaucoma    Personal history of diseases of skin and subcutaneous tissue    Personal history of urinary calculi    Unspecified hypothyroidism    Unspecified sinusitis (chronic)    Past Surgical History:  Procedure Laterality Date   Cataract surgery Bilateral    COLONOSCOPY  TONSILLECTOMY AND ADENOIDECTOMY     TOTAL KNEE ARTHROPLASTY Right 05/03/2018   Procedure: TOTAL KNEE ARTHROPLASTY;  Surgeon: Gaynelle Arabian, MD;  Location: [facility];  Service: Orthopedics;  Laterality: Right;   Family History  Problem Relation Age of Onset   Memory loss Mother    Hypertension Mother    Hyperlipidemia Mother    Cancer Mother        breast cancer   Cancer Father        prostate   Diabetes Father        diet controlled   Cleft lip Son    Heart disease Maternal Grandmother    Colon cancer Neg Hx    Social History   Socioeconomic History   Marital status: Married    Spouse name: Not on file    Number of children: 2   Years of education: 15   Highest education level: Not on file  Occupational History   Occupation: Civil engineer, contracting - retired    Fish farm manager: [employer]: [employer]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 1-daughter 1999, 1 son 2003 (cleft lip/palate). wife is healthy.  work: Psychologist, occupational at [employer]: [place]  (02/17/2022)   Overall Financial Resource Strain (CARDIA)    Difficulty of Paying Living Expenses: Not hard at all  Food Insecurity: No Food Insecurity (02/17/2022)   Hunger Vital Sign    Worried About Running Out of Food in the Last Year: Never true    Ran Out of Food in the Last Year: Never true  Transportation Needs: No Transportation Needs (02/17/2022)   PRAPARE - Hydrologist (Medical): No    Lack of Transportation (Non-Medical): No  Physical Activity: Sufficiently Active (02/17/2022)   Exercise Vital Sign    Days of Exercise per Week: 5 days    Minutes of Exercise per Session: 60 min  Stress: No Stress Concern Present (02/17/2022)   [place]    Feeling of Stress : Not at all  Social Connections: Moderately Integrated (02/17/2022)   Social Connection and Isolation Panel [NHANES]    Frequency of Communication with Friends and Family: More than three times a week    Frequency of Social Gatherings with Friends and Family: More than three times a week    Attends Religious Services: More than 4 times per year    Active Member of [employer] or Organizations: No    Attends Music therapist: Never    Marital Status: Married    Tobacco  Counseling Counseling given: Not Answered   Clinical Intake:  Pre-visit preparation completed: Yes  Pain : No/denies pain     Nutritional Risks: None Diabetes: No  How often do you need to have someone help you when you read instructions, pamphlets, or other written materials from your doctor or pharmacy?: 1 - Never  Diabetic?no   Interpreter Needed?: No  Information entered by :: Jadene Pierini, LPN   Activities of Daily Living    02/17/2022    1:14 PM  In your present state of health, do  you have any difficulty performing the following activities:  Hearing? 0  Vision? 0  Difficulty concentrating or making decisions? 0  Walking or climbing stairs? 0  Dressing or bathing? 0  Doing errands, shopping? 0  Preparing Food and eating ? N  Using the Toilet? N  In the past six months, have you accidently leaked urine? N  Do you have problems with loss of bowel control? N  Managing your Medications? N  Managing your Finances? N  Housekeeping or managing your Housekeeping? N    Patient Care Team: Plotnikov, Evie Lacks, MD as PCP - General (Internal Medicine) Clent Jacks, MD as Consulting Physician (Ophthalmology) Gaynelle Arabian, MD as Consulting Physician (Orthopedic Surgery) Danella Sensing, MD as Consulting Physician (Dermatology)  Indicate any recent Medical Services you may have received from other than [facility] providers in the past year (date may be approximate).     Assessment:   This is a routine wellness examination for [place].  Hearing/Vision screen Vision Screening - Comments:: Annual eye exams   Dietary issues and exercise activities discussed: Current Exercise Habits: Home exercise routine, Type of exercise: walking, Time (Minutes): 60, Frequency (Times/Week): 5, Weekly Exercise (Minutes/Week): 300, Intensity: Mild, Exercise limited by: None identified   Goals Addressed               This Visit's Progress     Patient Stated (pt-stated)   On track      My goal is to lose some weight.  My ideal weight is 170-175 pounds.       Depression Screen    02/17/2022    1:13 PM 01/08/2021   10:08 AM 06/26/2020    3:37 PM 05/23/2019   10:45 AM 07/12/2018   10:44 AM 04/06/2017    3:47 PM 01/22/2016   10:39 AM  PHQ 2/9 Scores  PHQ - 2 Score 0 0 0 0 0 0 0  PHQ- 9 Score   0        Fall Risk    02/17/2022    1:09 PM 01/08/2021   10:01 AM 06/26/2020    3:37 PM 07/12/2018   10:44 AM 01/22/2016   10:39 AM  [place] in the past year? 0 0 0 0 No  Number falls in past yr: 0 0 0 0   Injury with Fall? 0 0 0 0   Risk for fall due to : No Fall Risks No Fall Risks No Fall Risks    Follow up Falls prevention discussed Falls evaluation completed  Falls evaluation completed     FALL RISK PREVENTION PERTAINING TO THE HOME:  Any stairs in or around the home? Yes  If so, are there any without handrails? No  Home free of loose throw rugs in walkways, pet beds, electrical cords, etc? Yes  Adequate lighting in your home to reduce risk of falls? No   ASSISTIVE DEVICES UTILIZED TO PREVENT FALLS:  Life alert? No  Use of a cane, walker or w/c? No  Grab bars in the bathroom? Yes  Shower chair or bench in shower? No  Elevated toilet seat or a handicapped toilet? No          02/17/2022    1:15 PM  6CIT Screen  What Year? 0 points  What month? 0 points  What time? 0 points  Count back from 20 0 points  Months in reverse 0 points  Repeat phrase 0 points  Total Score 0 points    Immunizations Immunization  History  Administered Date(s) Administered   Fluad Quad(high Dose 65+) 01/15/2019, 02/23/2020   Influenza Whole 02/14/2008, 02/13/2009, 02/03/2010   Influenza, High Dose Seasonal PF 01/28/2021   Influenza,inj,Quad PF,6+ Mos 03/23/2013, 03/10/2014, 02/27/2015, 01/22/2016, 02/18/2017, 01/26/2018   Influenza,inj,quad, With Preservative 02/10/2019   Influenza-Unspecified 03/13/2011   PFIZER(Purple Top)SARS-COV-2 Vaccination 07/26/2019,  08/17/2019, 02/23/2020   Pfizer Covid-19 Vaccine Bivalent Booster 66yr & up 01/28/2021   Tdap 07/21/2012   Zoster Recombinat (Shingrix) 06/17/2017, 09/21/2017    TDAP status: Up to date  Flu Vaccine status: Due, Education has been provided regarding the importance of this vaccine. Advised may receive this vaccine at local pharmacy or Health Dept. Aware to provide a copy of the vaccination record if obtained from local pharmacy or Health Dept. Verbalized acceptance and understanding.  Pneumococcal vaccine status: Due, Education has been provided regarding the importance of this vaccine. Advised may receive this vaccine at local pharmacy or Health Dept. Aware to provide a copy of the vaccination record if obtained from local pharmacy or Health Dept. Verbalized acceptance and understanding.  Covid-19 vaccine status: Completed vaccines  Qualifies for Shingles Vaccine? Yes   Zostavax completed Yes   Shingrix Completed?: Yes  Screening Tests Health Maintenance  Topic Date Due   Pneumonia Vaccine 67 Years old (1 - PCV) Never done   COVID-19 Vaccine (5 - Pfizer series) 05/30/2021   INFLUENZA VACCINE  12/10/2021   TETANUS/TDAP  07/22/2022   COLONOSCOPY (Pts 45-458yrInsurance coverage will need to be confirmed)  01/15/2030   Hepatitis C Screening  Completed   Zoster Vaccines- Shingrix  Completed   HPV VACCINES  Aged Out    Health Maintenance  Health Maintenance Due  Topic Date Due   Pneumonia Vaccine [age]Years old (1 - PCV) Never done   COVID-19 Vaccine (5 - Pfizer series) 05/30/2021   INFLUENZA VACCINE  12/10/2021    Colorectal cancer screening: Type of screening: Colonoscopy. Completed 01/26/2020. Repeat every 10 years  Lung Cancer Screening: (Low Dose CT Chest recommended if Age 67-80ears, 30 pack-year currently smoking OR have quit w/in 15years.) does not qualify.   Lung Cancer Screening Referral: n/a  Additional Screening:  Hepatitis C Screening: does not qualify;    Vision Screening: Recommended annual ophthalmology exams for early detection of glaucoma and other disorders of the eye. Is the patient up to date with their annual eye exam?  Yes  Who is the provider or what is the name of the office in which the patient attends annual eye exams? Dr.groat  If pt is not established with a provider, would they like to be referred to a provider to establish care? No .   Dental Screening: Recommended annual dental exams for proper oral hygiene  Community Resource Referral / Chronic Care Management: CRR required this visit?  No   CCM required this visit?  No      Plan:     I have personally reviewed and noted the following in the patient's chart:   Medical and social history Use of alcohol, tobacco or illicit drugs  Current medications and supplements including opioid prescriptions. Patient is not currently taking opioid prescriptions. Functional ability and status Nutritional status Physical activity Advanced directives List of other physicians Hospitalizations, surgeries, and ER visits in previous 12 months Vitals Screenings to include cognitive, depression, and falls Referrals and appointments  In addition, I have reviewed and discussed with patient certain preventive protocols, quality metrics, and best practice recommendations. A written personalized care plan for preventive  services as well as general preventive health recommendations were provided to patient.     Daphane Shepherd, LPN   [id number]   Nurse Notes: Due Flu vaccine   Medical screening examination/treatment/procedure(s) were performed by non-physician practitioner and as supervising physician I was immediately available for consultation/collaboration.  I agree with above. Lew Dawes, MD

## 2022-02-17 NOTE — Patient Instructions (Signed)
[name] , Thank you for taking time to come for your Medicare Wellness Visit. I appreciate your ongoing commitment to your health goals. Please review the following plan we discussed and let me know if I can assist you in the future.   These are the goals we discussed:  Goals       Patient Stated (pt-stated)      My goal is to lose some weight.  My ideal weight is 170-175 pounds.        This is a list of the screening recommended for you and due dates:  Health Maintenance  Topic Date Due   Pneumonia Vaccine (1 - PCV) Never done   COVID-19 Vaccine (5 - Pfizer series) 05/30/2021   Flu Shot  12/10/2021   Tetanus Vaccine  07/22/2022   Colon Cancer Screening  01/15/2030   Hepatitis C Screening: USPSTF Recommendation to screen - Ages 18-79 yo.  Completed   Zoster (Shingles) Vaccine  Completed   HPV Vaccine  Aged Out    Advanced directives: Please bring a copy of your health care power of attorney and living will to the office to be added to your chart at your convenience.   Conditions/risks identified: Aim for 30 minutes of exercise or brisk walking, 6-8 glasses of water, and 5 servings of fruits and vegetables each day.   Next appointment: Follow up in one year for your annual wellness visit.   Preventive Care 62 Years and Older, Male  Preventive care refers to lifestyle choices and visits with your health care provider that can promote health and wellness. What does preventive care include? A yearly physical exam. This is also called an annual well check. Dental exams once or twice a year. Routine eye exams. Ask your health care provider how often you should have your eyes checked. Personal lifestyle choices, including: Daily care of your teeth and gums. Regular physical activity. Eating a healthy diet. Avoiding tobacco and drug use. Limiting alcohol use. Practicing safe sex. Taking low doses of aspirin every day. Taking vitamin and mineral supplements as recommended by  your health care provider. What happens during an annual well check? The services and screenings done by your health care provider during your annual well check will depend on your age, overall health, lifestyle risk factors, and family history of disease. Counseling  Your health care provider may ask you questions about your: Alcohol use. Tobacco use. Drug use. Emotional well-being. Home and relationship well-being. Sexual activity. Eating habits. History of falls. Memory and ability to understand (cognition). Work and work Statistician. Screening  You may have the following tests or measurements: Height, weight, and BMI. Blood pressure. Lipid and cholesterol levels. These may be checked every 5 years, or more frequently if you are over 41 years old. Skin check. Lung cancer screening. You may have this screening every year starting at age 74 if you have a 30-pack-year history of smoking and currently smoke or have quit within the past 15 years. Fecal occult blood test (FOBT) of the stool. You may have this test every year starting at age 55. Flexible sigmoidoscopy or colonoscopy. You may have a sigmoidoscopy every 5 years or a colonoscopy every 10 years starting at age 90. Prostate cancer screening. Recommendations will vary depending on your family history and other risks. Hepatitis C blood test. Hepatitis B blood test. Sexually transmitted disease (STD) testing. Diabetes screening. This is done by checking your blood sugar (glucose) after you have not eaten for a while (  fasting). You may have this done every 1-3 years. Abdominal aortic aneurysm (AAA) screening. You may need this if you are a current or former smoker. Osteoporosis. You may be screened starting at age 37 if you are at high risk. Talk with your health care provider about your test results, treatment options, and if necessary, the need for more tests. Vaccines  Your health care provider may recommend certain vaccines,  such as: Influenza vaccine. This is recommended every year. Tetanus, diphtheria, and acellular pertussis (Tdap, Td) vaccine. You may need a Td booster every 10 years. Zoster vaccine. You may need this after age 55. Pneumococcal 13-valent conjugate (PCV13) vaccine. One dose is recommended after age 13. Pneumococcal polysaccharide (PPSV23) vaccine. One dose is recommended after age 66. Talk to your health care provider about which screenings and vaccines you need and how often you need them. This information is not intended to replace advice given to you by your health care provider. Make sure you discuss any questions you have with your health care provider. Document Released: 05/25/2015 Document Revised: 01/16/2016 Document Reviewed: 02/27/2015 Elsevier Interactive Patient Education  2017 [place] Prevention in the Home Falls can cause injuries. They can happen to people of all ages. There are many things you can do to make your home safe and to help prevent falls. What can I do on the outside of my home? Regularly fix the edges of walkways and driveways and fix any cracks. Remove anything that might make you trip as you walk through a door, such as a raised step or threshold. Trim any bushes or trees on the path to your home. Use bright outdoor lighting. Clear any walking paths of anything that might make someone trip, such as rocks or tools. Regularly check to see if handrails are loose or broken. Make sure that both sides of any steps have handrails. Any raised decks and porches should have guardrails on the edges. Have any leaves, snow, or ice cleared regularly. Use sand or salt on walking paths during winter. Clean up any spills in your garage right away. This includes oil or grease spills. What can I do in the bathroom? Use night lights. Install grab bars by the toilet and in the tub and shower. Do not use towel bars as grab bars. Use non-skid mats or decals in the tub or  shower. If you need to sit down in the shower, use a plastic, non-slip stool. Keep the floor dry. Clean up any water that spills on the floor as soon as it happens. Remove soap buildup in the tub or shower regularly. Attach bath mats securely with double-sided non-slip rug tape. Do not have throw rugs and other things on the floor that can make you trip. What can I do in the bedroom? Use night lights. Make sure that you have a light by your bed that is easy to reach. Do not use any sheets or blankets that are too big for your bed. They should not hang down onto the floor. Have a firm chair that has side arms. You can use this for support while you get dressed. Do not have throw rugs and other things on the floor that can make you trip. What can I do in the kitchen? Clean up any spills right away. Avoid walking on wet floors. Keep items that you use a lot in easy-to-reach places. If you need to reach something above you, use a strong step stool that has a grab bar.  Keep electrical cords out of the way. Do not use floor polish or wax that makes floors slippery. If you must use wax, use non-skid floor wax. Do not have throw rugs and other things on the floor that can make you trip. What can I do with my stairs? Do not leave any items on the stairs. Make sure that there are handrails on both sides of the stairs and use them. Fix handrails that are broken or loose. Make sure that handrails are as long as the stairways. Check any carpeting to make sure that it is firmly attached to the stairs. Fix any carpet that is loose or worn. Avoid having throw rugs at the top or bottom of the stairs. If you do have throw rugs, attach them to the floor with carpet tape. Make sure that you have a light switch at the top of the stairs and the bottom of the stairs. If you do not have them, ask someone to add them for you. What else can I do to help prevent falls? Wear shoes that: Do not have high heels. Have  rubber bottoms. Are comfortable and fit you well. Are closed at the toe. Do not wear sandals. If you use a stepladder: Make sure that it is fully opened. Do not climb a closed stepladder. Make sure that both sides of the stepladder are locked into place. Ask someone to hold it for you, if possible. Clearly mark and make sure that you can see: Any grab bars or handrails. First and last steps. Where the edge of each step is. Use tools that help you move around (mobility aids) if they are needed. These include: Canes. Walkers. Scooters. Crutches. Turn on the lights when you go into a dark area. Replace any light bulbs as soon as they burn out. Set up your furniture so you have a clear path. Avoid moving your furniture around. If any of your floors are uneven, fix them. If there are any pets around you, be aware of where they are. Review your medicines with your doctor. Some medicines can make you feel dizzy. This can increase your chance of falling. Ask your doctor what other things that you can do to help prevent falls. This information is not intended to replace advice given to you by your health care provider. Make sure you discuss any questions you have with your health care provider. Document Released: 02/22/2009 Document Revised: 10/04/2015 Document Reviewed: 06/02/2014 Elsevier Interactive Patient Education  2017 [employer].

## 2022-02-20 ENCOUNTER — Telehealth: Payer: Self-pay

## 2022-02-20 ENCOUNTER — Ambulatory Visit (INDEPENDENT_AMBULATORY_CARE_PROVIDER_SITE_OTHER): Payer: Medicare Other | Admitting: Internal Medicine

## 2022-02-20 ENCOUNTER — Encounter: Payer: Self-pay | Admitting: Internal Medicine

## 2022-02-20 VITALS — BP 112/82 | HR 86 | Temp 97.8°F | Ht 69.5 in | Wt 216.4 lb

## 2022-02-20 DIAGNOSIS — E039 Hypothyroidism, unspecified: Secondary | ICD-10-CM

## 2022-02-20 DIAGNOSIS — R739 Hyperglycemia, unspecified: Secondary | ICD-10-CM

## 2022-02-20 DIAGNOSIS — Z23 Encounter for immunization: Secondary | ICD-10-CM

## 2022-02-20 DIAGNOSIS — I2510 Atherosclerotic heart disease of native coronary artery without angina pectoris: Secondary | ICD-10-CM | POA: Diagnosis not present

## 2022-02-20 DIAGNOSIS — I2583 Coronary atherosclerosis due to lipid rich plaque: Secondary | ICD-10-CM

## 2022-02-20 DIAGNOSIS — E669 Obesity, unspecified: Secondary | ICD-10-CM | POA: Diagnosis not present

## 2022-02-20 LAB — COMPREHENSIVE METABOLIC PANEL
ALT: 33 U/L (ref 0–53)
AST: 26 U/L (ref 0–37)
Albumin: 4.3 g/dL (ref 3.5–5.2)
Alkaline Phosphatase: 98 U/L (ref 39–117)
BUN: 20 mg/dL (ref 6–23)
CO2: 27 mEq/L (ref 19–32)
Calcium: 9.3 mg/dL (ref 8.4–10.5)
Chloride: 104 mEq/L (ref 96–112)
Creatinine, Ser: 1.06 mg/dL (ref 0.40–1.50)
GFR: 72.64 mL/min (ref >60.00)
Glucose, Bld: 120 mg/dL — ABNORMAL HIGH (ref 70–99)
Potassium: 4.4 mEq/L (ref 3.5–5.1)
Sodium: 139 mEq/L (ref 135–145)
Total Bilirubin: 0.6 mg/dL (ref 0.2–1.2)
Total Protein: 7.4 g/dL (ref 6.0–8.3)

## 2022-02-20 LAB — HEMOGLOBIN A1C: Hgb A1c MFr Bld: 6.3 % (ref 4.6–6.5)

## 2022-02-20 MED ORDER — WEGOVY 0.25 MG/0.5ML ~~LOC~~ SOAJ: 0.2500 mg | SUBCUTANEOUS | 2 refills | Status: DC

## 2022-02-20 NOTE — Patient Instructions (Addendum)
Phentermine option  Sign up for [employer] ( via [employer] on your phone or your ipad). If you don't have a Art therapist card  - go to [employer] branch. They will set you up in 15 minutes. It is free. You can check out books to read and to listen, check out magazines and newspapers, movies etc.   The Obesity Code book by Sharman Cheek   These suggestions will probably help you to improve your metabolism if you are not overweight and to lose weight if you are overweight: 1.  Reduce your consumption of sugars and starches.  Eliminate high fructose corn syrup from your diet.  Reduce your consumption of processed foods.  For desserts try to have seasonal fruits, berries, nuts, cheeses or dark chocolate with more than 70% cacao. 2.  Do not snack 3.  You do not have to eat breakfast.  If you choose to have breakfast - eat plain greek yogurt, eggs, oatmeal (without sugar) - use honey if you need to. 4.  Drink water, freshly brewed unsweetened tea (green, black or herbal) or coffee.  Do not drink sodas including diet sodas , juices, beverages sweetened with artificial sweeteners. 5.  Reduce your consumption of refined grains. 6.  Avoid protein drinks such as Optifast, Slim fast etc. Eat chicken, fish, meat, dairy and beans for your sources of protein. 7.  Natural unprocessed fats like cold pressed virgin olive oil, butter, coconut oil are good for you.  Eat avocados. 8.  Increase your consumption of fiber.  Fruits, berries, vegetables, whole grains, flaxseed, chia seeds, beans, popcorn, nuts, oatmeal are good sources of fiber 9.  Use vinegar in your diet, i.e. apple cider vinegar, red wine or balsamic vinegar 10.  You can try fasting.  For example you can skip breakfast and lunch every other day (24-hour fast) 11.  Stress reduction, good night sleep, relaxation, meditation, yoga and other physical activity is likely to help you to maintain low weight too. 12.  If you drink alcohol, limit your alcohol  intake to no more than 2 drinks a day.

## 2022-02-20 NOTE — Assessment & Plan Note (Addendum)
Wt loss advised Wegovy and Phentermine options discussed

## 2022-02-20 NOTE — Progress Notes (Signed)
Subjective:  Patient ID: Jay Holmes, male    DOB: 11-11-1955  Age: 66 y.o. MRN: [id number]  CC: Follow-up (6 MONTHS F/U)   HPI Jay Holmes presents for CAD, hyperglycemia, obesity  C/o wt gain  Outpatient Medications Prior to Visit  Medication Sig Dispense Refill   bimatoprost (LUMIGAN) 0.01 % SOLN Place 1 drop into both eyes at bedtime.     Brimonidine Tartrate-Timolol (COMBIGAN OP) Apply 1 drop to eye 2 (two) times daily.      Cholecalciferol (VITAMIN D3) 50 MCG (2000 UT) capsule Take 1 capsule (2,000 Units total) by mouth daily. 100 capsule 3   ketoconazole (NIZORAL) 2 % cream Apply 1 application topically daily as needed (rosacea).     levothyroxine (SYNTHROID) 100 MCG tablet TAKE 1 TABLET BY MOUTH  DAILY 90 tablet 3   MegaRed Omega-3 Krill Oil 500 MG CAPS Take 1 capsule by mouth every morning. 100 capsule 3   omeprazole (PRILOSEC) 40 MG capsule Take 1 capsule (40 mg total) by mouth daily. 90 capsule 3   Vilazodone HCl 20 MG TABS Take 1 tablet (20 mg total) by mouth daily. 90 tablet 1   No facility-administered medications prior to visit.    ROS: Review of Systems  Constitutional:  Positive for unexpected weight change. Negative for appetite change and fatigue.  HENT:  Negative for congestion, nosebleeds, sneezing, sore throat and trouble swallowing.   Eyes:  Negative for itching and visual disturbance.  Respiratory:  Negative for cough.   Cardiovascular:  Negative for chest pain, palpitations and leg swelling.  Gastrointestinal:  Negative for abdominal distention, blood in stool, diarrhea and nausea.  Genitourinary:  Negative for frequency and hematuria.  Musculoskeletal:  Negative for back pain, gait problem, joint swelling and neck pain.  Skin:  Negative for rash.  Neurological:  Negative for dizziness, tremors, speech difficulty and weakness.  Psychiatric/Behavioral:  Negative for agitation, dysphoric mood and sleep disturbance. The patient is not  nervous/anxious.     Objective:  BP 112/82 (BP Location: Left Arm)   Pulse 86   Temp 97.8 F (36.6 C) (Oral)   Ht 5' 9.5" (1.765 m)   Wt 216 lb 6.4 oz (98.2 kg)   SpO2 96%   BMI 31.50 kg/m   BP Readings from Last 3 Encounters:  02/20/22 112/82  08/21/21 124/70  04/30/21 109/70    Wt Readings from Last 3 Encounters:  02/20/22 216 lb 6.4 oz (98.2 kg)  02/17/22 213 lb (96.6 kg)  08/21/21 213 lb 9.6 oz (96.9 kg)    Physical Exam Constitutional:      General: He is not in acute distress.    Appearance: He is well-developed. He is obese.     Comments: NAD  Eyes:     Conjunctiva/sclera: Conjunctivae normal.     Pupils: Pupils are equal, round, and reactive to light.  Neck:     Thyroid: No thyromegaly.     Vascular: No JVD.  Cardiovascular:     Rate and Rhythm: Normal rate and regular rhythm.     Heart sounds: Normal heart sounds. No murmur heard.    No friction rub. No gallop.  Pulmonary:     Effort: Pulmonary effort is normal. No respiratory distress.     Breath sounds: Normal breath sounds. No wheezing or rales.  Chest:     Chest wall: No tenderness.  Abdominal:     General: Bowel sounds are normal. There is no distension.     Palpations: Abdomen  is soft. There is no mass.     Tenderness: There is no abdominal tenderness. There is no guarding or rebound.  Musculoskeletal:        General: No tenderness. Normal range of motion.     Cervical back: Normal range of motion.  Lymphadenopathy:     Cervical: No cervical adenopathy.  Skin:    General: Skin is warm and dry.     Findings: No rash.  Neurological:     Mental Status: He is alert and oriented to person, place, and time.     Cranial Nerves: No cranial nerve deficit.     Motor: No abnormal muscle tone.     Coordination: Coordination normal.     Gait: Gait normal.     Deep Tendon Reflexes: Reflexes are normal and symmetric.  Psychiatric:        Behavior: Behavior normal.        Thought Content: Thought  content normal.        Judgment: Judgment normal.     Lab Results  Component Value Date   WBC 5.6 02/07/2021   HGB 15.2 02/07/2021   HCT 45.3 02/07/2021   PLT 248.0 02/07/2021   GLUCOSE 119 (H) 05/20/2021   CHOL 134 05/20/2021   TRIG 62.0 05/20/2021   HDL 39.70 05/20/2021   LDLDIRECT 74.0 02/07/2021   LDLCALC 81 05/20/2021   ALT 30 05/20/2021   AST 22 05/20/2021   NA 139 05/20/2021   K 5.1 05/20/2021   CL 104 05/20/2021   CREATININE 1.07 05/20/2021   BUN 18 05/20/2021   CO2 30 05/20/2021   TSH 3.11 02/07/2021   PSA 0.26 02/07/2021   INR 0.94 04/28/2018   HGBA1C 6.2 02/07/2021    CT CARDIAC SCORING (SELF PAY ONLY)  Addendum Date: 11/06/2020   ADDENDUM REPORT: 11/06/2020 13:09 CLINICAL DATA:  Cardiovascular Disease Risk stratification EXAM: Coronary Calcium Score TECHNIQUE: A gated, non-contrast computed tomography scan of the heart was performed using 1m slice thickness. Axial images were analyzed on a dedicated workstation. Calcium scoring of the coronary arteries was performed using the Agatston method. FINDINGS: Coronary Calcium Score: Left main: 0 Left anterior descending artery: 24.2 Left circumflex artery: 0 Right coronary artery: 58.5 Total: 82.7 Percentile: 49 Pericardium: Normal. Ascending Aorta: Normal caliber; aortic atherosclerosis noted. [facility]-cardiac: [facility] IMPRESSION: Coronary calcium score of 82.7. This was 448percentile for age-, race-, and sex-matched controls. RECOMMENDATIONS: Coronary artery calcium (CAC) score is a strong predictor of incident coronary heart disease (CHD) and provides predictive information beyond traditional risk factors. CAC scoring is reasonable to use in the decision to withhold, postpone, or initiate statin therapy in intermediate-risk or selected borderline-risk asymptomatic adults (age 67-75years and LDL-C >=70 to <190 mg/dL) who do not have diabetes or established atherosclerotic cardiovascular  disease (ASCVD).* In intermediate-risk (10-year ASCVD risk >=7.5% to <20%) adults or selected borderline-risk (10-year ASCVD risk >=5% to <7.5%) adults in whom a CAC score is measured for the purpose of making a treatment decision the following recommendations have been made: If CAC=0, it is reasonable to withhold statin therapy and reassess in 5 to 10 years, as long as higher risk conditions are absent (diabetes mellitus, family history of premature CHD in first degree relatives (males <55 years; females <65 years), cigarette smoking, or LDL >=190 mg/dL). If CAC is 1 to 99, it is reasonable to initiate statin therapy for patients >=547years of age. If CAC is >=100 or >=75th percentile, it is  reasonable to initiate statin therapy at any age. Cardiology referral should be considered for patients with CAC scores >=400 or >=75th percentile. *2018 AHA/ACC/AACVPR/AAPA/ABC/ACPM/ADA/AGS/APhA/ASPC/NLA/PCNA Guideline on the Management of Blood Cholesterol: A Report of the American College of Cardiology/American Heart Association Task Force on Clinical Practice Guidelines. J Am Coll Cardiol. 2019;73(24):3168-3209. Kirk Ruths, MD Electronically Signed   By: Kirk Ruths M.D.   On: 11/06/2020 13:09   Result Date: 11/06/2020 EXAM: OVER-READ INTERPRETATION  CT CHEST The following report is an over-read performed by radiologist Dr. Aletta Edouard of [facility], [place] on 11/06/2020. This over-read does not include interpretation of cardiac or coronary anatomy or pathology. The coronary calcium score interpretation by the cardiologist is attached. COMPARISON:  None. FINDINGS: Vascular: No significant noncardiac vascular findings. Mediastinum/Nodes: Visualized mediastinum and hilar regions demonstrate no lymphadenopathy or masses. Lungs/Pleura: 4 mm subpleural nodule in the lateral left lower lobe on image 32/3 and 2.6 mm similar lateral subpleural nodule in the right lower lobe on image 28/3. Visualized lungs show no  evidence of pulmonary edema, consolidation, pneumothorax or pleural fluid. Upper Abdomen: No acute abnormality. Musculoskeletal: No chest wall mass or suspicious bone lesions identified. IMPRESSION: 4 mm left lower lobe and 2.6 mm right lower lobe subpleural nodules. These are statistically most likely of postinflammatory origin. No follow-up needed if patient is low-risk (and has no known or suspected primary neoplasm). Non-contrast chest CT can be considered in 12 months if patient is high-risk. This recommendation follows the consensus statement: Guidelines for Management of Incidental Pulmonary Nodules Detected on CT Images: From the Fleischner Society 2017; Radiology 2017; 284:228-243. Electronically Signed: By: Aletta Edouard M.D. On: 11/06/2020 11:45    Assessment & Plan:   Problem List Items Addressed This Visit     CAD (coronary artery disease)     Coronary CT calcium score of 82.7. Statin or fish oil advised On Krill oil Statin advised. Pt declined      Hypothyroidism    Cont on Levothyroxine      Obesity (BMI 30-39.9)    Will prescribe KCMKLK Diet discussed      Relevant Medications   Semaglutide-Weight Management (WEGOVY) 0.25 MG/0.5ML SOAJ   Other Visit Diagnoses     Needs flu shot    -  Primary   Relevant Orders   Flu Vaccine QUAD High Dose(Fluad) (Completed)         Meds ordered this encounter  Medications   Semaglutide-Weight Management (WEGOVY) 0.25 MG/0.5ML SOAJ    Sig: Inject 0.25 mg into the skin once a week.    Dispense:  2 mL    Refill:  2    BMI 31, glucose intolerance, dyslipidemia      Follow-up: Return in about 3 months (around 05/23/2022) for a follow-up visit.  Walker Kehr, MD

## 2022-02-20 NOTE — Assessment & Plan Note (Signed)
Coronary CT calcium score of 82.7. Statin or fish oil advised On Krill oil Statin advised. Pt declined

## 2022-02-20 NOTE — Assessment & Plan Note (Addendum)
BJSEGB and Phentermine options discussed Diet discussed

## 2022-02-20 NOTE — Assessment & Plan Note (Signed)
Cont on Levothyroxine 

## 2022-02-20 NOTE — Telephone Encounter (Signed)
Patient is calling in asking if it is recommended for him to get a pneumonia shot.

## 2022-02-21 ENCOUNTER — Telehealth: Payer: Self-pay | Admitting: *Deleted

## 2022-02-21 NOTE — Telephone Encounter (Signed)
Call E on back of the card.. was given [phone] PA line. Gave clinical question to rep. Wanting notes and last labs fax to [phone]. Faxed both and waiting on status. Ref# [id number].[name]/LMB

## 2022-02-21 NOTE — Telephone Encounter (Signed)
Rec'd fax pt needing PA for wegovy. Submitted w/  (Key: [id number]) . Rec'd msg [employer] does not review prior authorization for this plan. Please refer to the phone number on the back of the prescription ID card. Per chart pt has [employer] will contact plan.Marland KitchenJohny Chess

## 2022-02-23 NOTE — Telephone Encounter (Signed)
Yes, Prevnar 20.  Thanks

## 2022-02-24 NOTE — Telephone Encounter (Signed)
Notified pt w/MD response.../lmb 

## 2022-02-27 ENCOUNTER — Ambulatory Visit (INDEPENDENT_AMBULATORY_CARE_PROVIDER_SITE_OTHER): Payer: Medicare Other | Admitting: *Deleted

## 2022-02-27 DIAGNOSIS — Z23 Encounter for immunization: Secondary | ICD-10-CM | POA: Diagnosis not present

## 2022-02-27 NOTE — Telephone Encounter (Signed)
Called to check status for PA. Med was denied on Oct 13. Per [name] per plan med Ozempic is a tier 2 and it is covered. Do u want ozempic.Marland KitchenJohny Holmes

## 2022-02-27 NOTE — Progress Notes (Signed)
Administered Prevnar 20 right deltoid. Pt tolerated well.

## 2022-02-28 MED ORDER — OZEMPIC (0.25 OR 0.5 MG/DOSE) 2 MG/3ML ~~LOC~~ SOPN: PEN_INJECTOR | SUBCUTANEOUS | 1 refills | Status: DC

## 2022-02-28 NOTE — Telephone Encounter (Signed)
Yes, if they cover Ozempic for the diagnoses of impaired glucose tolerance or glucose intolerance.  Thank you

## 2022-02-28 NOTE — Telephone Encounter (Signed)
Sent ozempic to replace wegovy.Marland KitchenJohny Holmes

## 2022-03-04 NOTE — Telephone Encounter (Signed)
Cecille Aver from Cover My Meds called to let us know they discovered an error on the request for the [facility]. Call back number is [phone] and the reference key is [facility]

## 2022-04-30 ENCOUNTER — Encounter: Payer: Self-pay | Admitting: Family Medicine

## 2022-04-30 ENCOUNTER — Ambulatory Visit (INDEPENDENT_AMBULATORY_CARE_PROVIDER_SITE_OTHER): Payer: Medicare Other | Admitting: Family Medicine

## 2022-04-30 ENCOUNTER — Ambulatory Visit (INDEPENDENT_AMBULATORY_CARE_PROVIDER_SITE_OTHER): Payer: Medicare Other

## 2022-04-30 VITALS — BP 116/84 | HR 80 | Temp 97.6°F | Ht 69.5 in | Wt 218.0 lb

## 2022-04-30 DIAGNOSIS — I2510 Atherosclerotic heart disease of native coronary artery without angina pectoris: Secondary | ICD-10-CM

## 2022-04-30 DIAGNOSIS — I700 Atherosclerosis of aorta: Secondary | ICD-10-CM

## 2022-04-30 DIAGNOSIS — R7303 Prediabetes: Secondary | ICD-10-CM

## 2022-04-30 DIAGNOSIS — R42 Dizziness and giddiness: Secondary | ICD-10-CM

## 2022-04-30 DIAGNOSIS — R61 Generalized hyperhidrosis: Secondary | ICD-10-CM | POA: Diagnosis not present

## 2022-04-30 DIAGNOSIS — I2583 Coronary atherosclerosis due to lipid rich plaque: Secondary | ICD-10-CM

## 2022-04-30 LAB — TSH: TSH: 2.75 u[IU]/mL (ref 0.35–5.50)

## 2022-04-30 LAB — COMPREHENSIVE METABOLIC PANEL
ALT: 36 U/L (ref 0–53)
AST: 25 U/L (ref 0–37)
Albumin: 4.4 g/dL (ref 3.5–5.2)
Alkaline Phosphatase: 107 U/L (ref 39–117)
BUN: 18 mg/dL (ref 6–23)
CO2: 31 mEq/L (ref 19–32)
Calcium: 9.6 mg/dL (ref 8.4–10.5)
Chloride: 102 mEq/L (ref 96–112)
Creatinine, Ser: 1.14 mg/dL (ref 0.40–1.50)
GFR: 66.47 mL/min (ref >60.00)
Glucose, Bld: 110 mg/dL — ABNORMAL HIGH (ref 70–99)
Potassium: 4.5 mEq/L (ref 3.5–5.1)
Sodium: 139 mEq/L (ref 135–145)
Total Bilirubin: 0.9 mg/dL (ref 0.2–1.2)
Total Protein: 7.6 g/dL (ref 6.0–8.3)

## 2022-04-30 LAB — TROPONIN I (HIGH SENSITIVITY): High Sens Troponin I: 3 ng/L (ref 2–17)

## 2022-04-30 LAB — CBC WITH DIFFERENTIAL/PLATELET
Basophils Absolute: 0 10*3/uL (ref 0.0–0.1)
Basophils Relative: 0.6 % (ref 0.0–3.0)
Eosinophils Absolute: 0.1 10*3/uL (ref 0.0–0.7)
Eosinophils Relative: 2 % (ref 0.0–5.0)
HCT: 48.1 % (ref 39.0–52.0)
Hemoglobin: 16.1 g/dL (ref 13.0–17.0)
Lymphocytes Relative: 33.1 % (ref 12.0–46.0)
Lymphs Abs: 2.2 10*3/uL (ref 0.7–4.0)
MCHC: 33.5 g/dL (ref 30.0–36.0)
MCV: 94.1 fl (ref 78.0–100.0)
Monocytes Absolute: 0.6 10*3/uL (ref 0.1–1.0)
Monocytes Relative: 9.2 % (ref 3.0–12.0)
Neutro Abs: 3.7 10*3/uL (ref 1.4–7.7)
Neutrophils Relative %: 55.1 % (ref 43.0–77.0)
Platelets: 219 10*3/uL (ref 150.0–400.0)
RBC: 5.11 Mil/uL (ref 4.22–5.81)
RDW: 14.3 % (ref 11.5–15.5)
WBC: 6.6 10*3/uL (ref 4.0–10.5)

## 2022-04-30 LAB — URINALYSIS, ROUTINE W REFLEX MICROSCOPIC
Bilirubin Urine: NEGATIVE
Hgb urine dipstick: NEGATIVE
Ketones, ur: NEGATIVE
Leukocytes,Ua: NEGATIVE
Nitrite: NEGATIVE
Specific Gravity, Urine: >=1.03 — AB (ref 1.000–1.030)
Total Protein, Urine: NEGATIVE
Urine Glucose: NEGATIVE
Urobilinogen, UA: 0.2 (ref 0.0–1.0)
pH: 5.5 (ref 5.0–8.0)

## 2022-04-30 NOTE — Progress Notes (Signed)
Subjective:     Patient ID: Jay Holmes, male    DOB: 12-04-1955, 66 y.o.   MRN: [id number]  Chief Complaint  Patient presents with   Excessive Sweating    Got a cold sweat that lasted for a couple seconds and "didn't feel right" yesterday. Has not had another spell since then.     HPI Patient is in today for a brief episode of sweating that occurred yesterday after he stood up from sitting. States he didn't feel "right" for a few seconds, and thinks he may have had lightheadedness. He did not have any focal weakness.  Denies fever, chills, chest pain, palpitations, shortness of breath, abdominal pain, N/V/D, urinary symptoms, LE edema.   Denies recent illness.   He has hx of CAD and aortic atherosclerosis. Is not on statin.   Sleeps a lot during the day. Sleeps well at night. Wakes up feeling rested.   Walks and goes to the gym.  Has been retired for 13 years.   Mother had TIAs.        There are no preventive care reminders to display for this patient.  Past Medical History:  Diagnosis Date   Arthritis    Diastasis recti 07/21/2012   questionable umbilical hernia   Glaucoma    Personal history of diseases of skin and subcutaneous tissue    Personal history of urinary calculi    Unspecified hypothyroidism    Unspecified sinusitis (chronic)     Past Surgical History:  Procedure Laterality Date   Cataract surgery Bilateral    COLONOSCOPY      TONSILLECTOMY AND ADENOIDECTOMY     TOTAL KNEE ARTHROPLASTY Right 05/03/2018   Procedure: TOTAL KNEE ARTHROPLASTY;  Surgeon: Gaynelle Arabian, MD;  Location: [facility];  Service: Orthopedics;  Laterality: Right;    Family History  Problem Relation Age of Onset   Memory loss Mother    Hypertension Mother    Hyperlipidemia Mother    Cancer Mother        breast cancer   Cancer Father        prostate   Diabetes Father        diet controlled   Cleft lip Son    Heart disease Maternal Grandmother    Colon cancer Neg Hx      Social History   Socioeconomic History   Marital status: Married    Spouse name: Not on file   Number of children: 2   Years of education: 15   Highest education level: Not on file  Occupational History   Occupation: Civil engineer, contracting - retired    Fish farm manager: [place]: [employer]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 1-daughter 1999, 1 son 2003 (cleft lip/palate). wife is healthy.  work: Psychologist, occupational at [employer]: [place]  (02/17/2022)   Overall Financial Resource Strain (CARDIA)    Difficulty of Paying Living Expenses: Not hard at all  Food Insecurity: No Food Insecurity (02/17/2022)   Hunger Vital Sign    Worried About Running Out of Food in the Last Year: Never true    Ran Out  of Food in the Last Year: Never true  Transportation Needs: No Transportation Needs (02/17/2022)   PRAPARE - Hydrologist (Medical): No    Lack of Transportation (Non-Medical): No  Physical Activity: Sufficiently Active (02/17/2022)   Exercise Vital Sign    Days of Exercise per Week: 5 days    Minutes of Exercise per Session: 60 min  Stress: No Stress Concern Present (02/17/2022)   [place]    Feeling of Stress : Not at all  Social Connections: Moderately Integrated (02/17/2022)   Social Connection and Isolation Panel [NHANES]    Frequency of Communication with Friends and Family: More than three times a week    Frequency of Social Gatherings with Friends and Family: More than three times a week    Attends Religious Services: More than 4 times per year    Active Member of [employer] or  Organizations: No    Attends Archivist Meetings: Never    Marital Status: Married  Human resources officer Violence: Not At Risk (02/17/2022)   Humiliation, Afraid, Rape, and Kick questionnaire    Fear of Current or Ex-Partner: No    Emotionally Abused: No    Physically Abused: No    Sexually Abused: No    Outpatient Medications Prior to Visit  Medication Sig Dispense Refill   bimatoprost (LUMIGAN) 0.01 % SOLN Place 1 drop into both eyes at bedtime.     Brimonidine Tartrate-Timolol (COMBIGAN OP) Apply 1 drop to eye 2 (two) times daily.      Cholecalciferol (VITAMIN D3) 50 MCG (2000 UT) capsule Take 1 capsule (2,000 Units total) by mouth daily. 100 capsule 3   ketoconazole (NIZORAL) 2 % cream Apply 1 application topically daily as needed (rosacea).     levothyroxine (SYNTHROID) 100 MCG tablet TAKE 1 TABLET BY MOUTH  DAILY 90 tablet 3   MegaRed Omega-3 Krill Oil 500 MG CAPS Take 1 capsule by mouth every morning. 100 capsule 3   omeprazole (PRILOSEC) 40 MG capsule Take 1 capsule (40 mg total) by mouth daily. 90 capsule 3   Vilazodone HCl 20 MG TABS Take 1 tablet (20 mg total) by mouth daily. 90 tablet 1   Semaglutide,0.25 or 0.'5MG'$ /DOS, (OZEMPIC, 0.25 OR 0.5 MG/DOSE,) 2 MG/3ML SOPN Inject 0.25 ml into skin once a week (Patient not taking: Reported on 04/30/2022) 3 mL 1   No facility-administered medications prior to visit.    No Known Allergies  ROS     Objective:    Physical Exam Constitutional:      General: He is not in acute distress.    Appearance: He is not ill-appearing.  HENT:     Mouth/Throat:     Mouth: Mucous membranes are moist.     Tongue: No lesions. Tongue does not deviate from midline.     Pharynx: Oropharynx is clear. Uvula midline.  Eyes:     General: Lids are normal. Vision grossly intact. No visual field deficit.    Extraocular Movements: Extraocular movements intact.     Conjunctiva/sclera: Conjunctivae normal.     Pupils: Pupils are equal, round, and  reactive to light.  Neck:     Thyroid: No thyroid tenderness.  Cardiovascular:     Rate and Rhythm: Normal rate and regular rhythm.     Heart sounds: Normal heart sounds.  Pulmonary:     Effort: Pulmonary effort is normal.     Breath sounds: Normal breath sounds.  Musculoskeletal:        General: Normal range of motion.     Cervical back: Normal range of motion and neck supple.     Right lower leg: No edema.     Left lower leg: No edema.  Lymphadenopathy:     Cervical: No cervical adenopathy.  Skin:    General: Skin is warm and dry.     Coloration: Skin is not pale.     Findings: No rash.  Neurological:     General: No focal deficit present.     Mental Status: He is alert and oriented to person, place, and time.     Cranial Nerves: Cranial nerves 2-12 are intact. No facial asymmetry.     Sensory: Sensation is intact.     Motor: No weakness, tremor or pronator drift.     Coordination: Coordination is intact.     Gait: Gait is intact.  Psychiatric:        Attention and Perception: Attention normal.        Mood and Affect: Mood and affect normal.        Speech: Speech normal.        Behavior: Behavior normal.        Thought Content: Thought content normal.        Cognition and Memory: Cognition and memory normal.     BP 116/84 (BP Location: Left Arm, Patient Position: Sitting, Cuff Size: Large)   Pulse 80   Temp 97.6 F (36.4 C) (Temporal)   Ht 5' 9.5" (1.765 m)   Wt 218 lb (98.9 kg)   SpO2 98%   BMI 31.73 kg/m  Wt Readings from Last 3 Encounters:  04/30/22 218 lb (98.9 kg)  02/20/22 216 lb 6.4 oz (98.2 kg)  02/17/22 213 lb (96.6 kg)       Assessment & Plan:   Problem List Items Addressed This Visit       Cardiovascular and Mediastinum   Atherosclerosis of aorta (HCC)   Relevant Orders   VAS US CAROTID   EKG 12-Lead   DG Chest 2 View (Completed)   Troponin I (High Sensitivity) (Completed)   CAD (coronary artery disease)   Relevant Orders   VAS US  CAROTID   EKG 12-Lead   DG Chest 2 View (Completed)   Troponin I (High Sensitivity) (Completed)   Other Visit Diagnoses     Diaphoresis    -  Primary   Relevant Orders   EKG 12-Lead   DG Chest 2 View (Completed)   CBC with Differential/Platelet (Completed)   Comprehensive metabolic panel (Completed)   TSH (Completed)   Urinalysis, Routine w reflex microscopic (Completed)   Troponin I (High Sensitivity) (Completed)   Dizziness       Relevant Orders   VAS US CAROTID   DG Chest 2 View (Completed)   CBC with Differential/Platelet (Completed)   Comprehensive metabolic panel (Completed)   TSH (Completed)   Urinalysis, Routine w reflex microscopic (Completed)   Troponin I (High Sensitivity) (Completed)   Prediabetes       Relevant Orders   TSH (Completed)   Urinalysis, Routine w reflex microscopic (Completed)      He is not orthostatic. Neurological exam is benign.  Here today for a brief episode of diaphoresis and lightheadedness yesterday. He has felt fine since. Denies headache, chest pain, palpitations, DOE or LE edema.  He does have hx of CAD.  EKG shows NSR, rate 72, no acute ST- T wave changes.  Check labs including troponin, TSH, CBC and CMP Carotid US ordered  Advised that if he has any more symptoms that he should call 911 or go to the ED for evaluation.  He will follow up with PCP next month as scheduled.  His wife was also virtually involved during the visit today.    I have discontinued Simona Huh T. Rueda's Ozempic (0.25 or 0.5 MG/DOSE). I am also having him maintain his bimatoprost, Brimonidine Tartrate-Timolol (COMBIGAN OP), ketoconazole, MegaRed Omega-3 Krill Oil, levothyroxine, Vitamin D3, omeprazole, and Vilazodone HCl.  No orders of the defined types were placed in this encounter.

## 2022-04-30 NOTE — Patient Instructions (Signed)
Please go downstairs for labs and a chest X ray before you leave.   I have ordered carotid vascular ultrasound and they will call you to schedule this.   If you experience the same or different symptoms, I recommend that you call 911 or go to the emergency department for evaluation while you are symptomatic.   Please follow up with your primary care provider as scheduled next month.

## 2022-05-08 ENCOUNTER — Ambulatory Visit (HOSPITAL_COMMUNITY)
Admission: RE | Admit: 2022-05-08 | Discharge: 2022-05-08 | Disposition: A | Discharge status: Home / Self Care | Payer: Medicare Other | Source: Ambulatory Visit | Attending: Family Medicine | Admitting: Family Medicine

## 2022-05-08 DIAGNOSIS — I700 Atherosclerosis of aorta: Secondary | ICD-10-CM | POA: Insufficient documentation

## 2022-05-08 DIAGNOSIS — I2510 Atherosclerotic heart disease of native coronary artery without angina pectoris: Secondary | ICD-10-CM | POA: Insufficient documentation

## 2022-05-08 DIAGNOSIS — R42 Dizziness and giddiness: Secondary | ICD-10-CM | POA: Insufficient documentation

## 2022-05-08 DIAGNOSIS — I2583 Coronary atherosclerosis due to lipid rich plaque: Secondary | ICD-10-CM | POA: Diagnosis present

## 2022-05-24 ENCOUNTER — Other Ambulatory Visit: Payer: Self-pay | Admitting: Internal Medicine

## 2022-05-24 DIAGNOSIS — E039 Hypothyroidism, unspecified: Secondary | ICD-10-CM

## 2022-05-26 ENCOUNTER — Ambulatory Visit (INDEPENDENT_AMBULATORY_CARE_PROVIDER_SITE_OTHER): Payer: Medicare Other | Admitting: Internal Medicine

## 2022-05-26 ENCOUNTER — Encounter: Payer: Self-pay | Admitting: Internal Medicine

## 2022-05-26 VITALS — BP 118/78 | HR 86 | Temp 98.9°F | Ht 69.5 in | Wt 216.0 lb

## 2022-05-26 DIAGNOSIS — I2510 Atherosclerotic heart disease of native coronary artery without angina pectoris: Secondary | ICD-10-CM

## 2022-05-26 DIAGNOSIS — E039 Hypothyroidism, unspecified: Secondary | ICD-10-CM

## 2022-05-26 DIAGNOSIS — R55 Syncope and collapse: Secondary | ICD-10-CM

## 2022-05-26 DIAGNOSIS — F419 Anxiety disorder, unspecified: Secondary | ICD-10-CM | POA: Diagnosis not present

## 2022-05-26 DIAGNOSIS — E669 Obesity, unspecified: Secondary | ICD-10-CM

## 2022-05-26 DIAGNOSIS — I2583 Coronary atherosclerosis due to lipid rich plaque: Secondary | ICD-10-CM

## 2022-05-26 MED ORDER — VORTIOXETINE HBR 10 MG PO TABS: 10.0000 mg | ORAL_TABLET | Freq: Every day | ORAL | 5 refills | Status: DC

## 2022-05-26 NOTE — Assessment & Plan Note (Signed)
Pt had an episode of sweats x20 seconds. No LOC. No seizure. No relapse

## 2022-05-26 NOTE — Assessment & Plan Note (Signed)
On Krill oil

## 2022-05-26 NOTE — Assessment & Plan Note (Signed)
Not better  D/c Viibrid Start Trintellix

## 2022-05-26 NOTE — Assessment & Plan Note (Signed)
Cont on Levothyroxine

## 2022-05-26 NOTE — Progress Notes (Signed)
Subjective:  Patient ID: JASTEN GUYETTE, male    DOB: 1955/10/01  Age: 67 y.o. MRN: [id number]  CC: No chief complaint on file.   HPI NOEMI ISHMAEL presents for a short temper issue. C/o memory issues  Pt had an episode of sweats x20 seconds. No LOC  Mild memory problems   He is here w/wife Manuela Schwartz  Outpatient Medications Prior to Visit  Medication Sig Dispense Refill   bimatoprost (LUMIGAN) 0.01 % SOLN Place 1 drop into both eyes at bedtime.     Brimonidine Tartrate-Timolol (COMBIGAN OP) Apply 1 drop to eye 2 (two) times daily.      Cholecalciferol (VITAMIN D3) 50 MCG (2000 UT) capsule Take 1 capsule (2,000 Units total) by mouth daily. 100 capsule 3   ketoconazole (NIZORAL) 2 % cream Apply 1 application topically daily as needed (rosacea).     levothyroxine (SYNTHROID) 100 MCG tablet TAKE 1 TABLET BY MOUTH DAILY 90 tablet 3   MegaRed Omega-3 Krill Oil 500 MG CAPS Take 1 capsule by mouth every morning. 100 capsule 3   omeprazole (PRILOSEC) 40 MG capsule Take 1 capsule (40 mg total) by mouth daily. 90 capsule 3   Vilazodone HCl 20 MG TABS Take 1 tablet (20 mg total) by mouth daily. 90 tablet 1   No facility-administered medications prior to visit.    ROS: Review of Systems  Constitutional:  Negative for appetite change, fatigue and unexpected weight change.  HENT:  Negative for congestion, nosebleeds, sneezing, sore throat and trouble swallowing.   Eyes:  Negative for itching and visual disturbance.  Respiratory:  Negative for cough.   Cardiovascular:  Negative for chest pain, palpitations and leg swelling.  Gastrointestinal:  Negative for abdominal distention, blood in stool, diarrhea and nausea.  Genitourinary:  Negative for frequency and hematuria.  Musculoskeletal:  Negative for back pain, gait problem, joint swelling and neck pain.  Skin:  Negative for rash.  Neurological:  Negative for dizziness, tremors, speech difficulty and weakness.  Psychiatric/Behavioral:   Positive for decreased concentration and dysphoric mood. Negative for agitation, sleep disturbance and suicidal ideas. The patient is nervous/anxious.     Objective:  BP 118/78 (BP Location: Left Arm, Patient Position: Sitting, Cuff Size: Large)   Pulse 86   Temp 98.9 F (37.2 C) (Oral)   Ht 5' 9.5" (1.765 m)   Wt 216 lb (98 kg)   SpO2 94%   BMI 31.44 kg/m   BP Readings from Last 3 Encounters:  05/26/22 118/78  04/30/22 116/84  02/20/22 112/82    Wt Readings from Last 3 Encounters:  05/26/22 216 lb (98 kg)  04/30/22 218 lb (98.9 kg)  02/20/22 216 lb 6.4 oz (98.2 kg)    Physical Exam Constitutional:      General: He is not in acute distress.    Appearance: He is well-developed. He is obese.     Comments: NAD  Eyes:     Conjunctiva/sclera: Conjunctivae normal.     Pupils: Pupils are equal, round, and reactive to light.  Neck:     Thyroid: No thyromegaly.     Vascular: No JVD.  Cardiovascular:     Rate and Rhythm: Normal rate and regular rhythm.     Heart sounds: Normal heart sounds. No murmur heard.    No friction rub. No gallop.  Pulmonary:     Effort: Pulmonary effort is normal. No respiratory distress.     Breath sounds: Normal breath sounds. No wheezing or rales.  Chest:  Chest wall: No tenderness.  Abdominal:     General: Bowel sounds are normal. There is no distension.     Palpations: Abdomen is soft. There is no mass.     Tenderness: There is no abdominal tenderness. There is no guarding or rebound.  Musculoskeletal:        General: No tenderness. Normal range of motion.     Cervical back: Normal range of motion.  Lymphadenopathy:     Cervical: No cervical adenopathy.  Skin:    General: Skin is warm and dry.     Findings: No rash.  Neurological:     Mental Status: He is alert and oriented to person, place, and time.     Cranial Nerves: No cranial nerve deficit.     Motor: No abnormal muscle tone.     Coordination: Coordination normal.     Gait:  Gait normal.     Deep Tendon Reflexes: Reflexes are normal and symmetric.  Psychiatric:        Behavior: Behavior normal.        Thought Content: Thought content normal.        Judgment: Judgment normal.     Lab Results  Component Value Date   WBC 6.6 04/30/2022   HGB 16.1 04/30/2022   HCT 48.1 04/30/2022   PLT 219.0 04/30/2022   GLUCOSE 110 (H) 04/30/2022   CHOL 134 05/20/2021   TRIG 62.0 05/20/2021   HDL 39.70 05/20/2021   LDLDIRECT 74.0 02/07/2021   LDLCALC 81 05/20/2021   ALT 36 04/30/2022   AST 25 04/30/2022   NA 139 04/30/2022   K 4.5 04/30/2022   CL 102 04/30/2022   CREATININE 1.14 04/30/2022   BUN 18 04/30/2022   CO2 31 04/30/2022   TSH 2.75 04/30/2022   PSA 0.26 02/07/2021   INR 0.94 04/28/2018   HGBA1C 6.3 02/20/2022    VAS US CAROTID  Result Date: 05/08/2022 Carotid Arterial Duplex Study Patient Name:  NEIZAN DEBRUHL [place]stad  Date of Exam:   05/08/2022 Medical Rec #: [id number]       Accession #:    [id number] Date of Birth: 11-22-1955       Patient Gender: M Patient Age:   7 years Exam Location:  [facility] Procedure:      VAS US CAROTID Referring Phys: Loletha Carrow HENSON --------------------------------------------------------------------------------  Indications:       Dizziness per order. Patient reports acute onset of "flush"                    feeling accompanied with cold sweat all over his body, with                    cold sweat particularly on his head lasting for at least 30                    seconds to 1 minute. During that time he was panicked and                    cannot recall if he was experiencing any other symptoms. Risk Factors:      No history of smoking, coronary artery disease. Comparison Study:  NA Performing Technologist: Sharlett Iles RVT  Examination Guidelines: A complete evaluation includes B-mode imaging, spectral Doppler, color Doppler, and power Doppler as needed of all accessible portions of each vessel. Bilateral testing is considered an integral  part of a complete examination. Limited examinations for reoccurring indications may  be performed as noted.  Right Carotid Findings: +----------+--------+--------+--------+------------------+--------+           PSV cm/sEDV cm/sStenosisPlaque DescriptionComments +----------+--------+--------+--------+------------------+--------+ CCA Prox  85      19                                         +----------+--------+--------+--------+------------------+--------+ CCA Distal72      19                                         +----------+--------+--------+--------+------------------+--------+ ICA Prox  60      13      1-39%   heterogenous               +----------+--------+--------+--------+------------------+--------+ ICA Mid   67      27                                         +----------+--------+--------+--------+------------------+--------+ ICA Distal66      24                                         +----------+--------+--------+--------+------------------+--------+ ECA       101     9                                          +----------+--------+--------+--------+------------------+--------+ +----------+--------+-------+----------------+-------------------+           PSV cm/sEDV cmsDescribe        Arm Pressure (mmHG) +----------+--------+-------+----------------+-------------------+ [id number]            Multiphasic, [id number]                 +----------+--------+-------+----------------+-------------------+ +---------+--------+--+--------+--+---------+ VertebralPSV cm/s47EDV cm/s12Antegrade +---------+--------+--+--------+--+---------+  Left Carotid Findings: +----------+--------+--------+--------+----------------------+--------+           PSV cm/sEDV cm/sStenosisPlaque Description    Comments +----------+--------+--------+--------+----------------------+--------+ CCA Prox  96      22                                              +----------+--------+--------+--------+----------------------+--------+ CCA Distal74      19                                             +----------+--------+--------+--------+----------------------+--------+ ICA Prox  93      17      1-39%   heterogenous and focal         +----------+--------+--------+--------+----------------------+--------+ ICA Mid   68      25                                    tortuous +----------+--------+--------+--------+----------------------+--------+ ICA Distal65      27  tortuous +----------+--------+--------+--------+----------------------+--------+ ECA       106     11                                             +----------+--------+--------+--------+----------------------+--------+ +----------+--------+--------+----------------+-------------------+           PSV cm/sEDV cm/sDescribe        Arm Pressure (mmHG) +----------+--------+--------+----------------+-------------------+ [id number]             Multiphasic, [id number]                 +----------+--------+--------+----------------+-------------------+ +---------+--------+--+--------+--+---------+ VertebralPSV cm/s35EDV cm/s12Antegrade +---------+--------+--+--------+--+---------+   Summary: Right Carotid: Velocities in the right ICA are consistent with a 1-39% stenosis. Left Carotid: Velocities in the left ICA are consistent with a 1-39% stenosis. Vertebrals:  Bilateral vertebral arteries demonstrate antegrade flow. Subclavians: Normal flow hemodynamics were seen in bilateral subclavian              arteries. *See table(s) above for measurements and observations.  Electronically signed by Carlyle Dolly MD on 05/08/2022 at 7:55:26 PM.    Final     Assessment & Plan:   Problem List Items Addressed This Visit       Cardiovascular and Mediastinum   Near syncope    Pt had an episode of sweats x20 seconds. No LOC. No seizure. No relapse       CAD (coronary artery disease)    On Krill oil        Endocrine   Hypothyroidism    Cont on Levothyroxine        Other   Obesity (BMI 30-39.9)    On diet      Anxiety - Primary    Not better  D/c Viibrid Start Trintellix      Relevant Medications   vortioxetine HBr (TRINTELLIX) 10 MG TABS tablet        Follow-up: Return in about 6 weeks (around 07/07/2022) for a follow-up visit.  Walker Kehr, MD

## 2022-08-12 ENCOUNTER — Encounter: Payer: Self-pay | Admitting: Internal Medicine

## 2022-08-24 ENCOUNTER — Other Ambulatory Visit: Payer: Self-pay | Admitting: Internal Medicine

## 2022-08-24 DIAGNOSIS — K219 Gastro-esophageal reflux disease without esophagitis: Secondary | ICD-10-CM

## 2022-10-07 ENCOUNTER — Ambulatory Visit (INDEPENDENT_AMBULATORY_CARE_PROVIDER_SITE_OTHER): Payer: Medicare Other

## 2022-10-07 ENCOUNTER — Ambulatory Visit (INDEPENDENT_AMBULATORY_CARE_PROVIDER_SITE_OTHER): Payer: Medicare Other | Admitting: Podiatry

## 2022-10-07 ENCOUNTER — Encounter: Payer: Self-pay | Admitting: Podiatry

## 2022-10-07 DIAGNOSIS — M7752 Other enthesopathy of left foot: Secondary | ICD-10-CM

## 2022-10-07 DIAGNOSIS — M722 Plantar fascial fibromatosis: Secondary | ICD-10-CM | POA: Diagnosis not present

## 2022-10-07 DIAGNOSIS — M7750 Other enthesopathy of unspecified foot: Secondary | ICD-10-CM

## 2022-10-07 NOTE — Progress Notes (Signed)
  Subjective:  Patient ID: Jay Holmes, male    DOB: 11/30/1955,  MRN: [id number]  Chief Complaint  Patient presents with   Foot Pain    np bottom of foot pain - left heel - it's been coming on for a few months - He was at the beach this past weekend and it became worse    67 y.o. male presents with the above complaint. History confirmed with patient.   Objective:  Physical Exam: warm, good capillary refill, no trophic changes or ulcerative lesions, normal DP and PT pulses, and normal sensory exam. Left Foot: point tenderness over the heel pad   Radiographs: Multiple views x-ray of the left foot: no fracture, dislocation, swelling or degenerative changes noted Assessment:   1. Plantar fasciitis, right      Plan:  Patient was evaluated and treated and all questions answered.  Discussed the etiology and treatment options for plantar fasciitis including stretching, formal physical therapy, supportive shoegears such as a running shoe or sneaker, pre fabricated orthoses, injection therapy, and oral medications. We also discussed the role of surgical treatment of this for patients who do not improve after exhausting non-surgical treatment options.   -XR reviewed with patient -Educated patient on stretching and icing of the affected limb -Injection delivered to the plantar fascia of the left foot. -Has a history of gastric reflux so would like to avoid NSAIDs if possible, if not improving or worsening would consider methylprednisolone taper  Return if symptoms worsen or fail to improve.

## 2022-10-07 NOTE — Patient Instructions (Signed)

## 2022-10-23 ENCOUNTER — Ambulatory Visit (INDEPENDENT_AMBULATORY_CARE_PROVIDER_SITE_OTHER): Payer: Medicare Other | Admitting: Podiatry

## 2022-10-23 DIAGNOSIS — M722 Plantar fascial fibromatosis: Secondary | ICD-10-CM

## 2022-10-23 MED ORDER — METHYLPREDNISOLONE 4 MG PO TBPK: ORAL_TABLET | ORAL | 0 refills | Status: DC

## 2022-10-26 ENCOUNTER — Encounter: Payer: Self-pay | Admitting: Podiatry

## 2022-10-26 NOTE — Progress Notes (Signed)
  Subjective:  Patient ID: Jay Holmes, male    DOB: June 28, 1955,  MRN: [id number]  Chief Complaint  Patient presents with   Foot Pain    Patient came in today for a left foot heel pain follow-up, patient is still having pain after injectio 5/28, rate of pain 5 out of 10    67 y.o. male presents with the above complaint. History confirmed with patient.   Objective:  Physical Exam: warm, good capillary refill, no trophic changes or ulcerative lesions, normal DP and PT pulses, and normal sensory exam. Left Foot: point tenderness over the heel pad   Radiographs: Multiple views x-ray of the left foot: no fracture, dislocation, swelling or degenerative changes noted Assessment:   1. Plantar fasciitis, right      Plan:  Patient was evaluated and treated and all questions answered.  Discussed the etiology and treatment options for plantar fasciitis including stretching, formal physical therapy, supportive shoegears such as a running shoe or sneaker, pre fabricated orthoses, injection therapy, and oral medications. We also discussed the role of surgical treatment of this for patients who do not improve after exhausting non-surgical treatment options.   -methylpred taper sent to pharmacy -recommended rest and immobilization in cam boot today to facilitate soft tissue healing. Dispensed today -recommended PT. Referral sent to [facility] -MRI at next visit if no improvement  Return in about 8 weeks (around 12/18/2022) for recheck plantar fasciitis.

## 2022-11-10 ENCOUNTER — Ambulatory Visit (INDEPENDENT_AMBULATORY_CARE_PROVIDER_SITE_OTHER): Payer: Medicare Other | Admitting: Physician Assistant

## 2022-11-10 ENCOUNTER — Encounter (INDEPENDENT_AMBULATORY_CARE_PROVIDER_SITE_OTHER): Payer: Self-pay | Admitting: Physician Assistant

## 2022-11-10 VITALS — BP 124/77 | HR 83 | Temp 98.1°F | Ht 68.0 in | Wt 211.0 lb

## 2022-11-10 DIAGNOSIS — E039 Hypothyroidism, unspecified: Secondary | ICD-10-CM

## 2022-11-10 DIAGNOSIS — Z6832 Body mass index (BMI) 32.0-32.9, adult: Secondary | ICD-10-CM

## 2022-11-10 DIAGNOSIS — E6609 Other obesity due to excess calories: Secondary | ICD-10-CM | POA: Diagnosis not present

## 2022-11-10 DIAGNOSIS — M1710 Unilateral primary osteoarthritis, unspecified knee: Secondary | ICD-10-CM

## 2022-11-10 DIAGNOSIS — Z0289 Encounter for other administrative examinations: Secondary | ICD-10-CM

## 2022-11-10 NOTE — Progress Notes (Signed)
Office: [phone]  /  Fax: [phone]   Initial Visit  Jay Holmes was seen in clinic today to evaluate for obesity. He is interested in losing weight to improve overall health and reduce the risk of weight related complications. He presents today to review program treatment options, initial physical assessment, and evaluation.     He was referred by: Self-Referral  When asked what else they would like to accomplish? He states: Adopt healthier eating patterns, Improve energy levels and physical activity, Improve existing medical conditions, Reduce number of medications, Improve quality of life, Improve appearance, and Improve self-confidence  Weight history: Gained weight over the past 20 years, but unable to maintain weight loss after losing off and on for past few years.   When asked how has your weight affected you? He states: Has affected self-esteem, Contributed to medical problems, Contributed to orthopedic problems or mobility issues, Having fatigue, Having poor endurance, Problems with eating patterns, and Has affected mood   Some associated conditions: Arthritis:knee, Prediabetes, and GERD  Contributing factors: Nutritional, Medications, Stress, Reduced physical activity, and Eating patterns  Weight promoting medications identified: Psychotropic medications  Current nutrition plan: None  Current level of physical activity: Limited due to chronic pain or orthopedic problems  Current or previous pharmacotherapy: None  Response to medication: Never tried medications   Past medical history includes:   Past Medical History:  Diagnosis Date   Arthritis    Diastasis recti 07/21/2012   questionable umbilical hernia   Glaucoma    Personal history of diseases of skin and subcutaneous tissue    Personal history of urinary calculi    Unspecified hypothyroidism    Unspecified sinusitis (chronic)      Objective:   BP 124/77   Pulse 83   Temp 98.1 F (36.7 C)   Ht  5\' 8"  (1.727 m)   Wt 211 lb (95.7 kg)   SpO2 96%   BMI 32.08 kg/m  He was weighed on the bioimpedance scale: Body mass index is 32.08 kg/m.  Peak Weight:217 lbs , Body Fat%:32.5, Visceral Fat Rating:19, Weight trend over the last 12 months: Unchanged  General:  Alert, oriented and cooperative. Patient is in no acute distress.  Respiratory: Normal respiratory effort, no problems with respiration noted   Gait: able to ambulate independently  Mental Status: Normal mood and affect. Normal behavior. Normal judgment and thought content.   DIAGNOSTIC DATA REVIEWED:  BMET    Component Value Date/Time   NA 139 04/30/2022 1506   K 4.5 04/30/2022 1506   CL 102 04/30/2022 1506   CO2 31 04/30/2022 1506   GLUCOSE 110 (H) 04/30/2022 1506   BUN 18 04/30/2022 1506   CREATININE 1.14 04/30/2022 1506   CALCIUM 9.6 04/30/2022 1506   GFRNONAA >60 05/04/2018 0519   GFRAA >60 05/04/2018 0519   Lab Results  Component Value Date   HGBA1C 6.3 02/20/2022   HGBA1C 5.9 04/06/2017   No results found for: "INSULIN" CBC    Component Value Date/Time   WBC 6.6 04/30/2022 1506   RBC 5.11 04/30/2022 1506   HGB 16.1 04/30/2022 1506   HCT 48.1 04/30/2022 1506   PLT 219.0 04/30/2022 1506   MCV 94.1 04/30/2022 1506   MCH 30.9 05/04/2018 0519   MCHC 33.5 04/30/2022 1506   RDW 14.3 04/30/2022 1506   Iron/TIBC/Ferritin/ %Sat No results found for: "IRON", "TIBC", "FERRITIN", "IRONPCTSAT" Lipid Panel     Component Value Date/Time   CHOL 134 05/20/2021 1121   TRIG  62.0 05/20/2021 1121   HDL 39.70 05/20/2021 1121   CHOLHDL 3 05/20/2021 1121   VLDL 12.4 05/20/2021 1121   LDLCALC 81 05/20/2021 1121   LDLDIRECT 74.0 02/07/2021 1414   Hepatic Function Panel     Component Value Date/Time   PROT 7.6 04/30/2022 1506   ALBUMIN 4.4 04/30/2022 1506   AST 25 04/30/2022 1506   ALT 36 04/30/2022 1506   ALKPHOS 107 04/30/2022 1506   BILITOT 0.9 04/30/2022 1506   BILIDIR 0.1 07/19/2019 1205      Component  Value Date/Time   TSH 2.75 04/30/2022 1506     Assessment and Plan:   Class 1 obesity due to excess calories without serious comorbidity with body mass index (BMI) of 32.0 to 32.9 in adult  Acquired hypothyroidism  Primary osteoarthritis of knee, unspecified laterality        Obesity Treatment / Action Plan:  Patient will work on garnering support from family and friends to begin weight loss journey. Will work on eliminating or reducing the presence of highly palatable, calorie dense foods in the home. Will complete provided nutritional and psychosocial assessment questionnaire before the next appointment. Will be scheduled for indirect calorimetry to determine resting energy expenditure in a fasting state.  This will allow [place] to create a reduced calorie, high-protein meal plan to promote loss of fat mass while preserving muscle mass. Will work on reducing intake of added sugars, simple sugars and processed carbs. Will avoid skipping meals which may result in increased hunger signals and overeating at certain times. Counseled on the health benefits of losing 5%-15% of total body weight. Was counseled on nutritional approaches to weight loss and benefits of reducing processed foods and consuming plant-based foods and high quality protein as part of nutritional weight management. Was counseled on pharmacotherapy and role as an adjunct in weight management.   Obesity Education Performed Today:  He was weighed on the bioimpedance scale and results were discussed and documented in the synopsis.  We discussed obesity as a disease and the importance of a more detailed evaluation of all the factors contributing to the disease.  We discussed the importance of long term lifestyle changes which include nutrition, exercise and behavioral modifications as well as the importance of customizing this to his specific health and social needs.  We discussed the benefits of reaching a healthier  weight to alleviate the symptoms of existing conditions and reduce the risks of the biomechanical, metabolic and psychological effects of obesity.  Jay Holmes appears to be in the action stage of change and states they are ready to start intensive lifestyle modifications and behavioral modifications.  30 minutes was spent today on this visit including the above counseling, pre-visit chart review, and post-visit documentation.  Reviewed by clinician on day of visit: allergies, medications, problem list, medical history, surgical history, family history, social history, and previous encounter notes pertinent to obesity diagnosis.   Kinda Pottle,PA-C

## 2022-11-26 ENCOUNTER — Ambulatory Visit (INDEPENDENT_AMBULATORY_CARE_PROVIDER_SITE_OTHER): Payer: Medicare Other | Admitting: Family Medicine

## 2022-11-26 ENCOUNTER — Encounter (INDEPENDENT_AMBULATORY_CARE_PROVIDER_SITE_OTHER): Payer: Self-pay | Admitting: Family Medicine

## 2022-11-26 VITALS — BP 129/84 | HR 81 | Temp 97.9°F | Ht 68.0 in | Wt 213.0 lb

## 2022-11-26 DIAGNOSIS — R0602 Shortness of breath: Secondary | ICD-10-CM

## 2022-11-26 DIAGNOSIS — R5383 Other fatigue: Secondary | ICD-10-CM | POA: Diagnosis not present

## 2022-11-26 DIAGNOSIS — Z1331 Encounter for screening for depression: Secondary | ICD-10-CM | POA: Diagnosis not present

## 2022-11-26 DIAGNOSIS — E039 Hypothyroidism, unspecified: Secondary | ICD-10-CM

## 2022-11-26 DIAGNOSIS — K219 Gastro-esophageal reflux disease without esophagitis: Secondary | ICD-10-CM

## 2022-11-26 DIAGNOSIS — E669 Obesity, unspecified: Secondary | ICD-10-CM

## 2022-11-26 DIAGNOSIS — F411 Generalized anxiety disorder: Secondary | ICD-10-CM | POA: Diagnosis not present

## 2022-11-26 DIAGNOSIS — Z6832 Body mass index (BMI) 32.0-32.9, adult: Secondary | ICD-10-CM

## 2022-11-26 NOTE — Progress Notes (Signed)
Carlye Grippe, D.O.  ABFM, ABOM Specializing in Clinical Bariatric Medicine Office located at: [address]  [place], [place]  [address]     Bariatric Medicine Visit  Dear Posey Rea, Georgina Quint, MD or Plotnikov, Georgina Quint, MD,   Thank you for referring TAVIOUS GRIESINGER to our clinic today for evaluation.  We performed a consultation to discuss his options for treatment and educate the patient on his disease state.  The following note includes my evaluation and treatment recommendations.   Please do not hesitate to reach out to me directly if you have any further concerns.    Assessment and Plan:  Recheck Labs. Orders Placed This Encounter  Procedures   CBC with Differential/Platelet   VITAMIN D 25 Hydroxy (Vit-D Deficiency, Fractures)   Comprehensive metabolic panel   Folate   Hemoglobin A1c   Insulin, random   Lipid Panel With LDL/HDL Ratio   T4, free   TSH   Vitamin B12   T3   EKG 12-Lead   Medications Discontinued During This Encounter  Medication Reason   vortioxetine HBr (TRINTELLIX) 10 MG TABS tablet    Cholecalciferol (VITAMIN D3) 50 MCG (2000 UT) capsule     No orders of the defined types were placed in this encounter.   1) Fatigue Assessment: Condition is Uncontrolled. Maurine Minister does feel that his weight is causing his energy to be lower than it should be. Fatigue may be related to obesity, depression or many other causes. he does not appear to have any red flag symptoms and this appears to most likely be related to his current lifestyle habits and dietary intake.  Plan:  Labs will be ordered and reviewed with him at their next office visit in two weeks. Epworth sleepiness scale score appears to be within normal limits.  His ESS score is 11.   [place] reports daytime somnolence and denies waking up still tired. Patient has a history of symptoms of daytime fatigue. Kleber generally gets  6-8  hours of sleep per night, and states that he has difficulty  falling back asleep if awakened. Snoring is present. Apneic episodes is not present. Patient feels he has no concerns with sleep apnea but is often awaken around 3-3:30am due to his son going to work around that time and having difficulty going back to sleep after.  ECG: Performed and reviewed/ interpreted independently.  Normal sinus rhythm, rate 75bpm; reassuring without any acute abnormalities, will continue to monitor for symptoms  Modified PHQ-9 Depression Screen: His Food and Mood (modified PHQ-9) score was 4. In the meanwhile, Lavi will focus on self care including making healthy food choices by following their meal plan, improving sleep quality and focusing on stress reduction.  Once we are assured he is on an appropriate meal plan, we will start discussing exercise to increase cardiovascular fitness levels.    2) Shortness of breath on exertion Assessment: Condition is Uncontrolled. Maurine Minister does feel that he gets out of breath more easily than he used to when he exercises and seems to be worsening over time with weight gain.  This has gotten worse recently. Demarques denies shortness of breath at rest or orthopnea. Taitum's shortness of breath appears to be obesity related and exercise induced, as they do not appear to have any "red flag" symptoms/ concerns today.  Also, this condition appears to be related to a state of poor cardiovascular conditioning. He had a CT cardiac score on 11/06/2020 that resulted in a  82.7 which was in the 49th percentile.   Plan:  Obtain labs today and will be reviewed with him at their next office visit in two weeks. Indirect Calorimeter completed today to help guide our dietary regimen. It shows a VO2 of 243 and a REE of 1670.  His calculated basal metabolic rate is 1324 thus his measured basal metabolic rate is worse than expected. Patient agreed to work on weight loss at this time.  As Burr progresses through our weight loss program, we will gradually increase  exercise as tolerated to treat his current condition.   If Jonus follows our recommendations and loses 5-10% of their weight without improvement of his shortness of breath or if at any time, symptoms become more concerning, they agree to urgently follow up with their PCP/ specialist for further consideration/ evaluation.   Worth verbalizes agreement with this plan.    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 He is tolerating it well with no significant difficulty.  Lab Results  Component Value Date   TSH 2.75 04/30/2022   Plan: Recheck labs. Continue Synthroid as directed by PCP.  We will continue to monitor his condition and symptoms and adjust treatment as deemed clinically necessary.    Gastroesophageal reflux disease without esophagitis-  Assessment: Condition is Controlled. He continues Omeprazole 40mg  daily and is tolerating it well. He endorses not missing a day or he will have significant symptoms. He is overdue for an colonoscopy as his last one was on 01/16/2020.   Plan: Continue Omeprazole as directed. We will continue to monitor his condition alongside PCP/specialist.    GAD (generalized anxiety disorder)-  emotional eating Assessment: Condition is Uncontrolled. . Pt states that he has a hx of a short temper. Patient states that he has been placed on olanzapine before but records show he was placed on Trintellix by his PCP not to his recall. We will continue to monitor his condition and symptoms and adjust treatment as deemed clinically necessary.His mood does occasionally affect his eating habits. He tends to over eat or eat until he is stuffed while he is bored or doing other things such as watch TV. He does feel some guilt after over eating as he knows it is harmful to his health. He does note feeling uncomfortable  after eating quickly.   Plan: Patient was referred to Dr. Dewaine Conger, our Bariatric Psychologist, for evaluation due to his PHQ-4 score and significant struggles with emotional eating. Pt declines to see Dr. Dewaine Conger at this moment but we will continue to monitor his wellbeing and refer as needed.    TREATMENT PLAN FOR OBESITY:  Assessment: Condition is Not optimized.. Biometric data collected today, was reviewed with patient.  Muscle mass is  135.8lb. Fat mass is 70.2lb.Total body water is 101.6lb.   Plan: Begin Category 2 meal plan with 100 snack calories  Behavioral Intervention Additional resources provided today: category 2 meal plan information Evidence-based interventions for health behavior change were utilized today including the discussion of self monitoring techniques, problem-solving barriers and SMART goal setting techniques.   Regarding patient's less desirable eating habits and patterns, we employed the technique of small changes.  Pt will specifically work on: Starting prescribed meal plan for next visit.    Recommended Physical Activity Goals Murat has been advised to gradually work up to 150 minutes of moderate intensity  aerobic activity a week and strengthening exercises 2-3 times per week for cardiovascular health, weight loss maintenance and preservation of muscle mass.  He has agreed to continue their current level of activity  FOLLOW UP: Follow up in 2 weeks. He was informed of the importance of frequent follow up visits to maximize his success with intensive lifestyle modifications for his multiple health conditions. Minda Ditto Goleman is aware that we will review all of his lab results at our next visit.  He is aware that if anything is critical/ life threatening with the results, we will be contacting him via MyChart prior to the office visit to discuss management.   Minda Ditto Wilsey is aware that we will review all of his lab results at our next visit.  He is aware that if  anything is critical/ life threatening with the results, we will be contacting him via MyChart prior to the office visit to discuss management.    Chief Complaint:   OBESITY RAIJON LINDFORS (MR# [id number]) is a 67 y.o. male who presents for evaluation and treatment of obesity and related comorbidities. Current BMI is Body mass index is 32.39 kg/m. Ramie has been struggling with his weight for many years and has been unsuccessful in either losing weight, maintaining weight loss, or reaching his healthy weight goal.  WESTON KALLMAN is currently in the action stage of change and ready to dedicate time achieving and maintaining a healthier weight. Hansel is interested in becoming our patient and working on intensive lifestyle modifications including (but not limited to) diet and exercise for weight loss.  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 Patient is married to Susan, 40yr  and has a child named John age 38yr old. He lives with his wife and child.   Maurine Minister T Littman's habits were reviewed today and are as follows: his desired weight loss is to lose 11lbs in less 3-6 months and his final weight goal is to be 170lbs. He believes he started to gain weight after his retirement due to lack of exercise and excess of snacks and soft drinks. Pt has never done a diet or meal plan and dislikes seafood. He notes that eats out or eats fast food about 2-3 days days a week. He does note that he has an issue with portion control and eats until he is stuffed about 2-3 times a week. He tends to eat quickly and while doing other things such as watching TV.  He craves soft drinks and cereal. He drinks about 3-4 sodas a week and seldomly misses breakfast. His worst habit is snacking on cereal and sodas at night. Pt does walk and goes to the gym for 45-54mins, 3-5 days a week.   Subjective:   This is the patient's first visit at [facility].  The patient's NEW  PATIENT PACKET that they filled out prior to today's office visit was reviewed at length and information from that paperwork was included within the following office visit note.    Included in the packet: current and past health history, medications, allergies, ROS, gynecologic history (women only), surgical history, family history, social history, weight history, weight loss surgery history (for those that have had weight loss surgery), nutritional evaluation, mood and food questionnaire along with a depression screening (PHQ9) on all patients, an Epworth questionnaire, sleep habits questionnaire, patient life and health improvement goals questionnaire. These will all be scanned into the patient's chart  under the "media" tab.   Review of Systems: Please refer to new patient packet scanned into media. Pertinent positives were addressed with patient today.  Reviewed by clinician on day of visit: allergies, medications, problem list, medical history, surgical history, family history, social history, and previous encounter notes.  During the visit, I independently reviewed the patient's EKG, bioimpedance scale results, and indirect calorimeter results. I used this information to tailor a meal plan for the patient that will help Nam T Bihm to lose weight and will improve his obesity-related conditions going forward.  I performed a medically necessary appropriate examination and/or evaluation. I discussed the assessment and treatment plan with the patient. The patient was provided an opportunity to ask questions and all were answered. The patient agreed with the plan and demonstrated an understanding of the instructions. Labs were ordered today (unless patient declined them) and will be reviewed with the patient at our next visit unless more critical results need to be addressed immediately. Clinical information was updated and documented in the EMR.   Objective:   PHYSICAL EXAM: Blood pressure 129/84,  pulse 81, temperature 97.9 F (36.6 C), height 5\' 8"  (1.727 m), weight 213 lb (96.6 kg), SpO2 96%. Body mass index is 32.39 kg/m. General: Well Developed, well nourished, and in no acute distress.  HEENT: Normocephalic, atraumatic Skin: Warm and dry, cap RF less 2 sec, good turgor Chest:  Normal excursion, shape, no gross abn Respiratory: speaking in full sentences, no conversational dyspnea NeuroM-Sk: Ambulates w/o assistance, moves * 4 Psych: A and O *3, insight good, mood-full  Anthropometric Measurements Height: 5\' 8"  (1.727 m) Weight: 213 lb (96.6 kg) BMI (Calculated): 32.39 Weight at Last Visit: na Weight Lost Since Last Visit: na Weight Gained Since Last Visit: na Starting Weight: 213lb Peak Weight: 217lb Waist Measurement : 43 inches   Body Composition  Body Fat %: 32.9 % Fat Mass (lbs): 70.2 lbs Muscle Mass (lbs): 135.8 lbs Total Body Water (lbs): 101.6 lbs Visceral Fat Rating : 19   Other Clinical Data RMR: [id number] Fasting: yes Labs: yes Today's Visit #: 1 Starting Date: 11/26/22    DIAGNOSTIC DATA REVIEWED:  BMET    Component Value Date/Time   NA 139 04/30/2022 1506   K 4.5 04/30/2022 1506   CL 102 04/30/2022 1506   CO2 31 04/30/2022 1506   GLUCOSE 110 (H) 04/30/2022 1506   BUN 18 04/30/2022 1506   CREATININE 1.14 04/30/2022 1506   CALCIUM 9.6 04/30/2022 1506   GFRNONAA >60 05/04/2018 0519   GFRAA >60 05/04/2018 0519   Lab Results  Component Value Date   HGBA1C 6.3 02/20/2022   HGBA1C 5.9 04/06/2017   No results found for: "INSULIN" Lab Results  Component Value Date   TSH 2.75 04/30/2022   CBC    Component Value Date/Time   WBC 6.6 04/30/2022 1506   RBC 5.11 04/30/2022 1506   HGB 16.1 04/30/2022 1506   HCT 48.1 04/30/2022 1506   PLT 219.0 04/30/2022 1506   MCV 94.1 04/30/2022 1506   MCH 30.9 05/04/2018 0519   MCHC 33.5 04/30/2022 1506   RDW 14.3 04/30/2022 1506   Iron Studies No results found for: "IRON", "TIBC", "FERRITIN",  "IRONPCTSAT" Lipid Panel     Component Value Date/Time   CHOL 134 05/20/2021 1121   TRIG 62.0 05/20/2021 1121   HDL 39.70 05/20/2021 1121   CHOLHDL 3 05/20/2021 1121   VLDL 12.4 05/20/2021 1121   LDLCALC 81 05/20/2021 1121   LDLDIRECT 74.0 02/07/2021  1414   Hepatic Function Panel     Component Value Date/Time   PROT 7.6 04/30/2022 1506   ALBUMIN 4.4 04/30/2022 1506   AST 25 04/30/2022 1506   ALT 36 04/30/2022 1506   ALKPHOS 107 04/30/2022 1506   BILITOT 0.9 04/30/2022 1506   BILIDIR 0.1 07/19/2019 1205      Component Value Date/Time   TSH 2.75 04/30/2022 1506   Nutritional Lab Results  Component Value Date   VD25OH 40.27 10/12/2019    Attestation Statements:  Patient was in the office today and time spent on visit including pre-visit chart review and post-visit care/coordination of care and electronic medical record documentation was 60 minutes. 50% of the time was in face to face counseling of this patient's medical condition(s) and providing education on treatment options to include the first-line treatment of diet and lifestyle modification.  I, Clinical biochemist, acting as a Stage manager for [employer], DO., have compiled all relevant documentation for today's office visit on behalf of Thomasene Lot, DO, while in the presence of [employer], DO.  I have reviewed the above documentation for accuracy and completeness, and I agree with the above. Carlye Grippe, D.O.  The 21st Century Cures Act was signed into law in 2016 which includes the topic of electronic health records.  This provides immediate access to information in MyChart.  This includes consultation notes, operative notes, office notes, lab results and pathology reports.  If you have any questions about what you read please let us know at your next visit so we can discuss your concerns and take corrective action if need be.  We are right here with you.

## 2022-11-28 LAB — CBC WITH DIFFERENTIAL/PLATELET
Basophils Absolute: 0.1 10*3/uL (ref 0.0–0.2)
Basos: 1 %
EOS (ABSOLUTE): 0.2 10*3/uL (ref 0.0–0.4)
Eos: 3 %
Hematocrit: 48.6 % (ref 37.5–51.0)
Hemoglobin: 16 g/dL (ref 13.0–17.7)
Immature Grans (Abs): 0 10*3/uL (ref 0.0–0.1)
Immature Granulocytes: 0 %
Lymphocytes Absolute: 1.7 10*3/uL (ref 0.7–3.1)
Lymphs: 32 %
MCH: 31.9 pg (ref 26.6–33.0)
MCHC: 32.9 g/dL (ref 31.5–35.7)
MCV: 97 fL (ref 79–97)
Monocytes Absolute: 0.5 10*3/uL (ref 0.1–0.9)
Monocytes: 9 %
Neutrophils Absolute: 3 10*3/uL (ref 1.4–7.0)
Neutrophils: 55 %
Platelets: 193 10*3/uL (ref 150–450)
RBC: 5.02 x10E6/uL (ref 4.14–5.80)
RDW: 14.1 % (ref 11.6–15.4)
WBC: 5.4 10*3/uL (ref 3.4–10.8)

## 2022-11-28 LAB — COMPREHENSIVE METABOLIC PANEL
ALT: 39 IU/L (ref 0–44)
AST: 28 IU/L (ref 0–40)
Albumin: 4.3 g/dL (ref 3.9–4.9)
Alkaline Phosphatase: 120 IU/L (ref 44–121)
BUN/Creatinine Ratio: 20 (ref 10–24)
BUN: 19 mg/dL (ref 8–27)
Bilirubin Total: 0.6 mg/dL (ref 0.0–1.2)
CO2: 24 mmol/L (ref 20–29)
Calcium: 9.6 mg/dL (ref 8.6–10.2)
Chloride: 102 mmol/L (ref 96–106)
Creatinine, Ser: 0.93 mg/dL (ref 0.76–1.27)
Globulin, Total: 2.7 g/dL (ref 1.5–4.5)
Glucose: 99 mg/dL (ref 70–99)
Potassium: 4.3 mmol/L (ref 3.5–5.2)
Sodium: 142 mmol/L (ref 134–144)
Total Protein: 7 g/dL (ref 6.0–8.5)
eGFR: 89 mL/min/{1.73_m2} (ref >59)

## 2022-11-28 LAB — TSH: TSH: 0.69 u[IU]/mL (ref 0.450–4.500)

## 2022-11-28 LAB — FOLATE: Folate: 12.8 ng/mL (ref >3.0)

## 2022-11-28 LAB — LIPID PANEL WITH LDL/HDL RATIO
Cholesterol, Total: 156 mg/dL (ref 100–199)
HDL: 41 mg/dL (ref >39)
LDL Chol Calc (NIH): 94 mg/dL (ref 0–99)
LDL/HDL Ratio: 2.3 ratio (ref 0.0–3.6)
Triglycerides: 117 mg/dL (ref 0–149)
VLDL Cholesterol Cal: 21 mg/dL (ref 5–40)

## 2022-11-28 LAB — T3: T3, Total: 119 ng/dL (ref 71–180)

## 2022-11-28 LAB — T4, FREE: Free T4: 1.42 ng/dL (ref 0.82–1.77)

## 2022-11-28 LAB — INSULIN, RANDOM: INSULIN: 25.6 u[IU]/mL — ABNORMAL HIGH (ref 2.6–24.9)

## 2022-11-28 LAB — HEMOGLOBIN A1C
Est. average glucose Bld gHb Est-mCnc: 134 mg/dL
Hgb A1c MFr Bld: 6.3 % — ABNORMAL HIGH (ref 4.8–5.6)

## 2022-11-28 LAB — VITAMIN B12: Vitamin B-12: 599 pg/mL (ref 232–1245)

## 2022-11-28 LAB — VITAMIN D 25 HYDROXY (VIT D DEFICIENCY, FRACTURES): Vit D, 25-Hydroxy: 30.3 ng/mL (ref 30.0–100.0)

## 2022-12-09 ENCOUNTER — Encounter (INDEPENDENT_AMBULATORY_CARE_PROVIDER_SITE_OTHER): Payer: Self-pay | Admitting: Family Medicine

## 2022-12-09 ENCOUNTER — Ambulatory Visit (INDEPENDENT_AMBULATORY_CARE_PROVIDER_SITE_OTHER): Payer: Medicare Other | Admitting: Family Medicine

## 2022-12-09 VITALS — BP 110/73 | HR 72 | Temp 98.2°F | Ht 68.0 in | Wt 208.0 lb

## 2022-12-09 DIAGNOSIS — E039 Hypothyroidism, unspecified: Secondary | ICD-10-CM | POA: Diagnosis not present

## 2022-12-09 DIAGNOSIS — E559 Vitamin D deficiency, unspecified: Secondary | ICD-10-CM

## 2022-12-09 DIAGNOSIS — E669 Obesity, unspecified: Secondary | ICD-10-CM

## 2022-12-09 DIAGNOSIS — R7303 Prediabetes: Secondary | ICD-10-CM | POA: Diagnosis not present

## 2022-12-09 DIAGNOSIS — Z6831 Body mass index (BMI) 31.0-31.9, adult: Secondary | ICD-10-CM

## 2022-12-09 DIAGNOSIS — Z6832 Body mass index (BMI) 32.0-32.9, adult: Secondary | ICD-10-CM

## 2022-12-09 MED ORDER — VITAMIN D (ERGOCALCIFEROL) 1.25 MG (50000 UNIT) PO CAPS: 50000.0000 [IU] | ORAL_CAPSULE | ORAL | 0 refills | Status: DC

## 2022-12-09 NOTE — Patient Instructions (Signed)
The 10-year ASCVD risk score (Arnett DK, et al., 2019) is: 11.8%   Values used to calculate the score:     Age: 67 years     Sex: Male     Is Non-Hispanic African American: No     Diabetic: No     Tobacco smoker: No     Systolic Blood Pressure: 110 mmHg     Is BP treated: No     HDL Cholesterol: 41 mg/dL     Total Cholesterol: 156 mg/dL

## 2022-12-09 NOTE — Progress Notes (Signed)
Jay Holmes, D.O.  ABFM, ABOM Clinical Bariatric Medicine Physician  Office located at: [address] [place], [place]  [address]     Assessment and Plan:   Meds ordered this encounter  Medications   Vitamin D, Ergocalciferol, (DRISDOL) 1.25 MG (50000 UNIT) CAPS capsule    Sig: Take 1 capsule (50,000 Units total) by mouth every 7 (seven) days.    Dispense:  4 capsule    Refill:  0     Prediabetes  Assessment & Plan:  Condition is new and not optimized. Based on labs, pt has been in the prediabetic range for the last 5+ years, however he endorses never being told until today. His Insulin levels are 5.12 times normal. Blood counts are  not indicative of anemia. Kidney function is slightly elevated; pt endorses having a history of kidney stones. Labs are also indicative of dehydration. Liver function appears to be normal. B12 and Folate levels are within recommended normal limits.   In 11/06/20, Jay Holmes had a coronary calcium score of 82.7, which was 49% for age, race, and sex-matched controls. His LDL levels are less than ideal for his age and his HDL levels are in the low normal range, which is surprising because he exercises very frequently. ASCVD score is 11.8%  Lab Results  Component Value Date   HGBA1C 6.3 (H) 11/26/2022   HGBA1C 6.3 02/20/2022   HGBA1C 6.2 02/07/2021   INSULIN 25.6 (H) 11/26/2022    Lab Results  Component Value Date   WBC 5.4 11/26/2022   HGB 16.0 11/26/2022   HCT 48.6 11/26/2022   MCV 97 11/26/2022   PLT 193 11/26/2022    Lab Results  Component Value Date   CREATININE 0.93 11/26/2022   BUN 19 11/26/2022   NA 142 11/26/2022   K 4.3 11/26/2022   CL 102 11/26/2022   CO2 24 11/26/2022      Component Value Date/Time   PROT 7.0 11/26/2022 1427   ALBUMIN 4.3 11/26/2022 1427   AST 28 11/26/2022 1427   ALT 39 11/26/2022 1427   ALKPHOS 120 11/26/2022 1427   BILITOT 0.6 11/26/2022 1427   BILIDIR 0.1 07/19/2019 1205     Lab Results   Component Value Date   VITAMINB12 599 11/26/2022   FOLATE 12.8 11/26/2022    Lab Results  Component Value Date   CHOL 156 11/26/2022   HDL 41 11/26/2022   LDLCALC 94 11/26/2022   LDLDIRECT 74.0 02/07/2021   TRIG 117 11/26/2022   CHOLHDL 3 05/20/2021   The 10-year ASCVD risk score (Arnett DK, et al., 2019) is: 11.8%   Values used to calculate the score:     Age: 67 years     Sex: Male     Is Non-Hispanic African American: No     Diabetic: No     Tobacco smoker: No     Systolic Blood Pressure: 110 mmHg     Is BP treated: No     HDL Cholesterol: 41 mg/dL     Total Cholesterol: 156 mg/dL   Labs were reviewed with pt today and education provided on them and how the foods patient eats may influence these findings. All questions were answered about them.    Unless pre-existing renal or cardiopulmonary conditions exist which patient was told to limit their fluid intake by another provider, I recommended roughly one half of their weight in pounds, to be the approximate ounces of non-caloric, non-caffeinated beverages they should drink per day; including  more if they are engaging in exercise.   I counseled patient on pathophysiology of the disease process of I.R. and Pre-DM. Educated patient that having protein with each meal is important for stabilizing sugars and an important part of his prediabetes management as well as controlling hunger and cravings. In addition, we discussed Metformin, which can help [place] in the management of this disease process as well as with weight loss. Metformin handout provided. Will consider starting Metformin in future as we will focus on prudent nutritional plan at this time.   I also recommended Liridon to discuss with Dr.Plotivinok about possibly being on low-dose statin medicine based on his ASCVD risk and 6/22 cournary calcium score. Jay Holmes will continue to work on weight loss, and continue with our prudent nutritional plan that is high in lean protein, low in  saturated and trans fats, and low in fatty carbs to improve the above values. We will recheck labs  as deemed appropriate.    Vitamin D deficiency  Assessment & Plan: Condition is new and not optimized. Labs below indicate that his Vitamin D levels are not within the recommended range of 50-70.   Lab Results  Component Value Date   VD25OH 30.3 11/26/2022   VD25OH 40.27 10/12/2019   Labs were reviewed with pt today and all questions were answered about them. Pt instructed to begin ERGO 50K lU once weekly. I discussed the importance of vitamin D to the patient's health and well-being as well as to their ability to lose weight. We will monitor levels regularly (every 3-4 mo on average) to keep levels within normal limits and prevent over supplementation.   Acquired hypothyroidism Assessment & Plan: Condition is being treated with Synthroid 100 mcg daily. Thyroid labs all appear to be within normal limits, however I did discuss with pt that TSH is in the low normal range and that levels should be rechecked in 3 months with either [place] or PCP.   Lab Results  Component Value Date   TSH 0.690 11/26/2022   FREET4 1.42 11/26/2022        T3                                                             119                               11/26/22  Continue Synthroid per PCP. Will continue to monitor condition alongside PCP. Thyroid labs should be rechecked in around 3 months.    TREATMENT PLAN FOR OBESITY: BMI 32.0-32.9,adult - current BMI 31.63 Obesity (BMI 30.0-34.9)-starting bmi 11/26/22 32.39 Assessment & Plan:  Jay Holmes is here to discuss his progress with his obesity treatment plan along with follow-up of his obesity related diagnoses. See Medical Weight Management Flowsheet for complete bioelectrical impedance results.  Condition is improving, Biometric data collected today, was reviewed with patient.   Since last office visit patient's  Muscle mass has decreased by 0.4 lb. Fat mass  has decreased by 4.4 lb. Total body water has not changed. Counseling done on how various foods will affect these numbers and how to maximize success  Total lbs lost to date: 5 lbs  Total weight loss percentage to date: 2.35%  Showed pt how calorically dense nuts are and strongly encouraged him to measure intake and subtract from his snack calories. Recommended pt to try Keto Culture Hamburger Buns. Pt agrees to continue Category 2 meal plan with 100 snack calories.  Behavioral Intervention Additional resources provided today:  Insulin Resistance and Prediabetes Handout  Metformin Handout.  Evidence-based interventions for health behavior change were utilized today including the discussion of self monitoring techniques, problem-solving barriers and SMART goal setting techniques.   Regarding patient's less desirable eating habits and patterns, we employed the technique of small changes.  Pt will specifically work on: measuring/increasing protein intake and paying closer attention to snack calories for next visit.    Recommended Physical Activity Goals Jubal has been advised to slowly work up to 150 minutes of moderate intensity aerobic activity a week and strengthening exercises 2-3 times per week for cardiovascular health, weight loss maintenance and preservation of muscle mass. He has agreed to Continue current level of physical activity   FOLLOW UP: Return in 2-3 wks. He was informed of the importance of frequent follow up visits to maximize his success with intensive lifestyle modifications for his multiple health conditions.  Subjective:   Chief complaint: Obesity [place] is here to discuss his progress with his obesity treatment plan. He is on the Category 2 Plan with 100 snack calories and states he is following his eating plan approximately 90 % of the time. He states he is walking 40 minutes 7 days a wk and riding bike at gym 20-30 minutes 3 days a wk.   Interval History:  Jay Holmes is here today for his first follow-up office visit since starting the program with [place].  Since last office visit Jay Holmes has been doing well. He was recently in [place] for a few days visiting his in-laws. Reports working on portion control and significantly cutting back on his cereal and soda intake at night. Has not been measuring his protein intake. Uses snack calories on "handful of peanuts". Reports not drinking enough water. No complaints of hunger or cravings today.   All blood work/ lab tests that were recently ordered by myself or an outside provider were reviewed with patient today per their request. Extended time was spent counseling him on all new disease processes that were discovered or preexisting ones that are affected by BMI.  he understands that many of these abnormalities will need to monitored regularly along with the current treatment plan of prudent dietary changes, in which we are making each and every office visit, to improve these health parameters.  Review of Systems:  Pertinent positives were addressed with patient today.  Weight Summary and Biometrics   Weight Lost Since Last Visit: 5lb  Weight Gained Since Last Visit: 0lb   Vitals Temp: 98.2 F (36.8 C) BP: 110/73 Pulse Rate: 72 SpO2: 97 %   Anthropometric Measurements Height: 5\' 8"  (1.727 m) Weight: 208 lb (94.3 kg) BMI (Calculated): 31.63 Weight at Last Visit: 213lb Weight Lost Since Last Visit: 5lb Weight Gained Since Last Visit: 0lb Starting Weight: 213lb Total Weight Loss (lbs): 5 lb (2.268 kg) Peak Weight: 217lb   Body Composition  Body Fat %: 31.6 % Fat Mass (lbs): 65.8 lbs Muscle Mass (lbs): 135.4 lbs Total Body Water (lbs): 101.6 lbs Visceral Fat Rating : 19   Other Clinical Data Fasting: no Labs: no Today's Visit #: 2 Starting Date: 11/26/22   Objective:   PHYSICAL EXAM:  Blood pressure 110/73, pulse 72, temperature 98.2 F (  36.8 C), height 5\' 8"  (1.727 m), weight 208  lb (94.3 kg), SpO2 97%. Body mass index is 31.63 kg/m.  General: Well Developed, well nourished, and in no acute distress.  HEENT: Normocephalic, atraumatic Skin: Warm and dry, cap RF less 2 sec, good turgor Chest:  Normal excursion, shape, no gross abn Respiratory: speaking in full sentences, no conversational dyspnea NeuroM-Sk: Ambulates w/o assistance, moves * 4 Psych: A and O *3, insight good, mood-full  DIAGNOSTIC DATA REVIEWED:  BMET    Component Value Date/Time   NA 142 11/26/2022 1427   K 4.3 11/26/2022 1427   CL 102 11/26/2022 1427   CO2 24 11/26/2022 1427   GLUCOSE 99 11/26/2022 1427   GLUCOSE 110 (H) 04/30/2022 1506   BUN 19 11/26/2022 1427   CREATININE 0.93 11/26/2022 1427   CALCIUM 9.6 11/26/2022 1427   GFRNONAA >60 05/04/2018 0519   GFRAA >60 05/04/2018 0519   Lab Results  Component Value Date   HGBA1C 6.3 (H) 11/26/2022   HGBA1C 5.9 04/06/2017   Lab Results  Component Value Date   INSULIN 25.6 (H) 11/26/2022   Lab Results  Component Value Date   TSH 0.690 11/26/2022   CBC    Component Value Date/Time   WBC 5.4 11/26/2022 1427   WBC 6.6 04/30/2022 1506   RBC 5.02 11/26/2022 1427   RBC 5.11 04/30/2022 1506   HGB 16.0 11/26/2022 1427   HCT 48.6 11/26/2022 1427   PLT 193 11/26/2022 1427   MCV 97 11/26/2022 1427   MCH 31.9 11/26/2022 1427   MCH 30.9 05/04/2018 0519   MCHC 32.9 11/26/2022 1427   MCHC 33.5 04/30/2022 1506   RDW 14.1 11/26/2022 1427   Iron Studies No results found for: "IRON", "TIBC", "FERRITIN", "IRONPCTSAT" Lipid Panel     Component Value Date/Time   CHOL 156 11/26/2022 1427   TRIG 117 11/26/2022 1427   HDL 41 11/26/2022 1427   CHOLHDL 3 05/20/2021 1121   VLDL 12.4 05/20/2021 1121   LDLCALC 94 11/26/2022 1427   LDLDIRECT 74.0 02/07/2021 1414   Hepatic Function Panel     Component Value Date/Time   PROT 7.0 11/26/2022 1427   ALBUMIN 4.3 11/26/2022 1427   AST 28 11/26/2022 1427   ALT 39 11/26/2022 1427   ALKPHOS  120 11/26/2022 1427   BILITOT 0.6 11/26/2022 1427   BILIDIR 0.1 07/19/2019 1205      Component Value Date/Time   TSH 0.690 11/26/2022 1427   Nutritional Lab Results  Component Value Date   VD25OH 30.3 11/26/2022   VD25OH 40.27 10/12/2019   Attestations:   Reviewed by clinician on day of visit: allergies, medications, problem list, medical history, surgical history, family history, social history, and previous encounter notes.  I, Special Randolm Idol , acting as a Stage manager for [employer], DO., have compiled all relevant documentation for today's office visit on behalf of Thomasene Lot, DO, while in the presence of [employer], DO.  I have reviewed the above documentation for accuracy and completeness, and I agree with the above. Jay Holmes, D.O.  The 21st Century Cures Act was signed into law in 2016 which includes the topic of electronic health records.  This provides immediate access to information in MyChart.  This includes consultation notes, operative notes, office notes, lab results and pathology reports.  If you have any questions about what you read please let us know at your next visit so we can discuss your concerns and take corrective action if need  be.  We are right here with you.

## 2022-12-10 ENCOUNTER — Ambulatory Visit (INDEPENDENT_AMBULATORY_CARE_PROVIDER_SITE_OTHER): Payer: Medicare Other | Admitting: Family Medicine

## 2022-12-10 ENCOUNTER — Encounter (INDEPENDENT_AMBULATORY_CARE_PROVIDER_SITE_OTHER): Payer: Self-pay

## 2022-12-18 ENCOUNTER — Ambulatory Visit: Payer: Medicare Other | Admitting: Podiatry

## 2022-12-31 ENCOUNTER — Encounter: Payer: Medicare Other | Admitting: Internal Medicine

## 2022-12-31 ENCOUNTER — Ambulatory Visit (INDEPENDENT_AMBULATORY_CARE_PROVIDER_SITE_OTHER): Payer: Medicare Other | Admitting: Internal Medicine

## 2022-12-31 ENCOUNTER — Other Ambulatory Visit: Payer: Self-pay | Admitting: Internal Medicine

## 2022-12-31 ENCOUNTER — Encounter: Payer: Self-pay | Admitting: Internal Medicine

## 2022-12-31 ENCOUNTER — Ambulatory Visit (INDEPENDENT_AMBULATORY_CARE_PROVIDER_SITE_OTHER): Payer: Medicare Other

## 2022-12-31 VITALS — BP 102/62 | HR 70 | Temp 97.9°F | Ht 68.0 in | Wt 210.0 lb

## 2022-12-31 DIAGNOSIS — M546 Pain in thoracic spine: Secondary | ICD-10-CM

## 2022-12-31 DIAGNOSIS — M5134 Other intervertebral disc degeneration, thoracic region: Secondary | ICD-10-CM

## 2022-12-31 DIAGNOSIS — I950 Idiopathic hypotension: Secondary | ICD-10-CM

## 2022-12-31 MED ORDER — MELOXICAM 15 MG PO TABS: 15.0000 mg | ORAL_TABLET | Freq: Every day | ORAL | 0 refills | Status: DC

## 2022-12-31 NOTE — Patient Instructions (Signed)
Degenerative Disk Disease  Degenerative disk disease is a condition caused by changes that occur in the spinal disks as a person ages. Spinal disks are soft and compressible disks located between the bones of your spine (vertebrae). These disks act like shock absorbers. Degenerative disk disease can affect the whole spine. However, the neck and lower back are most often affected. Many changes can occur in the spinal disks with aging, such as: The spinal disks may dry and shrink. Small tears may occur in the tough, outer covering of the disk (annulus). The disk space may become smaller due to loss of water. Abnormal growths in the bone (spurs) may occur. This can put pressure on the nerve roots exiting the spinal canal, causing pain. The spinal canal may become narrowed. What are the causes? This condition may be caused by: Normal degeneration with age. Injuries. Certain activities and sports that cause damage. What increases the risk? The following factors may make you more likely to develop this condition: Being overweight. Having a family history of degenerative disk disease. Smoking and use of products that contain nicotine and tobacco. Sudden injury. Doing work that requires heavy lifting. What are the signs or symptoms? Symptoms of this condition include: Pain that varies in intensity. Some people have no pain, while others have severe pain. The location of the pain depends on the part of your backbone that is affected. You may have: Pain in your neck or arm if a disk in your neck area is affected. Pain in your back, buttocks, or legs if a disk in your lower back is affected. Pain that becomes worse while bending or reaching up, or with twisting movements. Pain that may start gradually and worsen as time passes. It may also start after a major or minor injury. Numbness or tingling in the arms or legs. How is this diagnosed? This condition may be diagnosed based on: Your symptoms  and medical history. A physical exam. Imaging tests, including: X-ray of the spine. CT scan. MRI. How is this treated? This condition may be treated with: Medicines. Injection of steroids into the back. Rehabilitation exercises. These activities aim to strengthen muscles in your back and abdomen to better support your spine. If treatments do not help to relieve your symptoms or you have severe pain, you may need surgery. Follow these instructions at home: Medicines Take over-the-counter and prescription medicines only as told by your health care provider. Ask your health care provider if the medicine prescribed to you: Requires you to avoid driving or using machinery. Can cause constipation. You may need to take these actions to prevent or treat constipation: Drink enough fluid to keep your urine pale yellow. Take over-the-counter or prescription medicines. Eat foods that are high in fiber, such as beans, whole grains, and fresh fruits and vegetables. Limit foods that are high in fat and processed sugars, such as fried or sweet foods. Activity Rest as told by your health care provider. Avoid sitting for a long time without moving. Get up to take short walks every 1-2 hours. This is important to improve blood flow and breathing. Ask for help if you feel weak or unsteady. Return to your normal activities as told by your health care provider. Ask your health care provider what activities are safe for you. Perform relaxation exercises as told by your health care provider. Maintain good posture. Do not lift anything that is heavier than 10 lb (4.5 kg), or the limit that you are told, until your health   care provider says that it is safe. Follow proper lifting and walking techniques as told by your health care provider. Managing pain, stiffness, and swelling     If directed, put ice on the painful area. Icing can help to relieve pain. To do this: Put ice in a plastic bag. Place a towel  between your skin and the bag. Leave the ice on for 20 minutes, 2-3 times a day. Remove the ice if your skin turns bright red. This is very important. If you cannot feel pain, heat, or cold, you have a greater risk of damage to the area. If directed, apply heat to the painful area as often as told by your health care provider. Heat can reduce the stiffness of your muscles. Use the heat source that your health care provider recommends, such as a moist heat pack or a heating pad. Place a towel between your skin and the heat source. Leave the heat on for 20-30 minutes. Remove the heat if your skin turns bright red. This is especially important if you are unable to feel pain, heat, or cold. You may have a greater risk of getting burned. General instructions Change your sitting, standing, and sleeping habits as told by your health care provider. Avoid sitting in the same position for long periods of time. Change positions frequently. Lose weight or maintain a healthy weight as told by your health care provider. Do not use any products that contain nicotine or tobacco, such as cigarettes, e-cigarettes, and chewing tobacco. If you need help quitting, ask your health care provider. Wear supportive footwear. Keep all follow-up visits. This is important. This may include visits for physical therapy. Contact a health care provider if you: Have pain that does not go away within 1-4 weeks. Lose your appetite. Lose weight without trying. Get help right away if you: Have severe pain. Notice weakness in your arms, hands, or legs. Begin to lose control of your bladder or bowel movements. Have fevers or night sweats. Summary Degenerative disk disease is a condition caused by changes that occur in the spinal disks as a person ages. This condition can affect the whole spine. However, the neck and lower back are most often affected. Take over-the-counter and prescription medicines only as told by your health  care provider. This information is not intended to replace advice given to you by your health care provider. Make sure you discuss any questions you have with your health care provider. Document Revised: 08/11/2019 Document Reviewed: 08/11/2019 [employer] Patient Education  2024 [employer]  

## 2022-12-31 NOTE — Progress Notes (Signed)
Subjective:  Patient ID: Jay Holmes, male    DOB: [birth year]  Age: 67 y.o. MRN: [id number]  CC: Back Pain   HPI Jay Holmes presents for f/up ---  Discussed the use of AI scribe software for clinical note transcription with the patient, who gave verbal consent to proceed.  History of Present Illness   The patient presented with complaints of back pain that started the previous night. The pain was described as a stiffness or cramping sensation, located in the middle of the back, without any radiation to the legs or feet. The patient denied any associated numbness, weakness, or tingling in the legs or feet. The onset of the pain was not associated with any injury or strenuous activity, but the patient noted that it often occurs after periods of prolonged sitting or after spending extended time at the gym. The patient was confident that the pain was not related to his history of kidney stones, as the location and nature of the pain were different.  The patient had not taken any medication for the pain at the time of the consultation, but anticipated taking ibuprofen at home. He reported that this type of pain usually resolves within 24 hours. The patient denied any other symptoms such as fever, chills, nausea, vomiting, chest pain, or shortness of breath.  The patient's blood pressure was noted to be lower than usual, at 106/87, but he denied any symptoms related to this. He reported that his blood pressure is typically around 117 systolic. The patient was not on any medication that could lower his blood pressure.       Outpatient Medications Prior to Visit  Medication Sig Dispense Refill   bimatoprost (LUMIGAN) 0.01 % SOLN Place 1 drop into both eyes at bedtime.     Brimonidine Tartrate-Timolol (COMBIGAN OP) Apply 1 drop to eye 2 (two) times daily.      ketoconazole (NIZORAL) 2 % cream Apply 1 application topically daily as needed (rosacea).     levothyroxine (SYNTHROID) 100 MCG tablet  TAKE 1 TABLET BY MOUTH DAILY 90 tablet 3   MegaRed Omega-3 Krill Oil 500 MG CAPS Take 1 capsule by mouth every morning. 100 capsule 3   omeprazole (PRILOSEC) 40 MG capsule TAKE 1 CAPSULE BY MOUTH DAILY 90 capsule 3   Vilazodone HCl 20 MG TABS Take 20 mg by mouth daily.     Vitamin D, Ergocalciferol, (DRISDOL) 1.25 MG (50000 UNIT) CAPS capsule Take 1 capsule (50,000 Units total) by mouth every 7 (seven) days. 4 capsule 0   No facility-administered medications prior to visit.    ROS Review of Systems  Constitutional:  Negative for diaphoresis and fatigue.  HENT: Negative.    Eyes: Negative.   Respiratory:  Negative for cough, chest tightness, shortness of breath and wheezing.   Cardiovascular:  Negative for chest pain, palpitations and leg swelling.  Gastrointestinal:  Negative for abdominal pain, constipation, diarrhea, nausea and vomiting.  Endocrine: Negative.   Genitourinary: Negative.  Negative for difficulty urinating.  Musculoskeletal:  Positive for back pain. Negative for arthralgias, joint swelling and myalgias.  Skin: Negative.   Neurological: Negative.  Negative for dizziness and weakness.  Hematological:  Negative for adenopathy. Does not bruise/bleed easily.  Psychiatric/Behavioral: Negative.      Objective:  BP 102/62 (BP Location: Right Arm, Patient Position: Sitting, Cuff Size: Large)   Pulse 70   Temp 97.9 F (36.6 C) (Oral)   Ht 5\' 8"  (1.727 m)   Wt 210 lb (  95.3 kg)   SpO2 95%   BMI 31.93 kg/m   BP Readings from Last 3 Encounters:  12/31/22 102/62  12/09/22 110/73  11/26/22 129/84    Wt Readings from Last 3 Encounters:  12/31/22 210 lb (95.3 kg)  12/09/22 208 lb (94.3 kg)  11/26/22 213 lb (96.6 kg)    Physical Exam Vitals reviewed.  Constitutional:      Appearance: Normal appearance. He is not ill-appearing.  HENT:     Nose: Nose normal.     Mouth/Throat:     Mouth: Mucous membranes are moist.  Eyes:     General: No scleral icterus.     Conjunctiva/sclera: Conjunctivae normal.  Cardiovascular:     Rate and Rhythm: Normal rate and regular rhythm.     Heart sounds: No murmur heard.    No friction rub. No gallop.  Pulmonary:     Effort: Pulmonary effort is normal.     Breath sounds: No stridor. No wheezing, rhonchi or rales.  Abdominal:     General: Abdomen is flat.     Palpations: There is no mass.     Tenderness: There is no abdominal tenderness. There is no guarding.     Hernia: No hernia is present.  Musculoskeletal:        General: Normal range of motion.     Cervical back: Normal and neck supple.     Thoracic back: Normal. No swelling, deformity, tenderness or bony tenderness. Normal range of motion.     Lumbar back: Normal.     Right lower leg: No edema.     Left lower leg: No edema.  Lymphadenopathy:     Cervical: No cervical adenopathy.  Skin:    General: Skin is warm and dry.  Neurological:     General: No focal deficit present.     Mental Status: He is alert. Mental status is at baseline.  Psychiatric:        Mood and Affect: Mood normal.        Behavior: Behavior normal.     Lab Results  Component Value Date   WBC 5.4 11/26/2022   HGB 16.0 11/26/2022   HCT 48.6 11/26/2022   PLT 193 11/26/2022   GLUCOSE 99 11/26/2022   CHOL 156 11/26/2022   TRIG 117 11/26/2022   HDL 41 11/26/2022   LDLDIRECT 74.0 02/07/2021   LDLCALC 94 11/26/2022   ALT 39 11/26/2022   AST 28 11/26/2022   NA 142 11/26/2022   K 4.3 11/26/2022   CL 102 11/26/2022   CREATININE 0.93 11/26/2022   BUN 19 11/26/2022   CO2 24 11/26/2022   TSH 0.690 11/26/2022   PSA 0.26 02/07/2021   INR 0.94 04/28/2018   HGBA1C 6.3 (H) 11/26/2022    VAS US CAROTID  Result Date: 05/08/2022 Carotid Arterial Duplex Study Patient Name:  Jay Holmes Warne  Date of Exam:   05/08/2022 Medical Rec #: [id number]       Accession #:    [id number] Date of Birth: [birth year]       Patient Gender: M Patient Age:   4 years Exam Location:  [facility]  Procedure:      VAS US CAROTID Referring Phys: Larene Beach HENSON --------------------------------------------------------------------------------  Indications:       Dizziness per order. Patient reports acute onset of "flush"                    feeling accompanied with cold sweat all over his body, with  cold sweat particularly on his head lasting for at least 30                    seconds to 1 minute. During that time he was panicked and                    cannot recall if he was experiencing any other symptoms. Risk Factors:      No history of smoking, coronary artery disease. Comparison Study:  NA Performing Technologist: Tyna Jaksch RVT  Examination Guidelines: A complete evaluation includes B-mode imaging, spectral Doppler, color Doppler, and power Doppler as needed of all accessible portions of each vessel. Bilateral testing is considered an integral part of a complete examination. Limited examinations for reoccurring indications may be performed as noted.  Right Carotid Findings: +----------+--------+--------+--------+------------------+--------+           PSV cm/sEDV cm/sStenosisPlaque DescriptionComments +----------+--------+--------+--------+------------------+--------+ CCA Prox  85      19                                         +----------+--------+--------+--------+------------------+--------+ CCA Distal72      19                                         +----------+--------+--------+--------+------------------+--------+ ICA Prox  60      13      1-39%   heterogenous               +----------+--------+--------+--------+------------------+--------+ ICA Mid   67      27                                         +----------+--------+--------+--------+------------------+--------+ ICA Distal66      24                                         +----------+--------+--------+--------+------------------+--------+ ECA       101     9                                           +----------+--------+--------+--------+------------------+--------+ +----------+--------+-------+----------------+-------------------+           PSV cm/sEDV cmsDescribe        Arm Pressure (mmHG) +----------+--------+-------+----------------+-------------------+ [id number]            Multiphasic, [id number]                 +----------+--------+-------+----------------+-------------------+ +---------+--------+--+--------+--+---------+ VertebralPSV cm/s47EDV cm/s12Antegrade +---------+--------+--+--------+--+---------+  Left Carotid Findings: +----------+--------+--------+--------+----------------------+--------+           PSV cm/sEDV cm/sStenosisPlaque Description    Comments +----------+--------+--------+--------+----------------------+--------+ CCA Prox  96      22                                             +----------+--------+--------+--------+----------------------+--------+ CCA Distal74      19                                             +----------+--------+--------+--------+----------------------+--------+  ICA Prox  93      17      1-39%   heterogenous and focal         +----------+--------+--------+--------+----------------------+--------+ ICA Mid   68      25                                    tortuous +----------+--------+--------+--------+----------------------+--------+ ICA Distal65      27                                    tortuous +----------+--------+--------+--------+----------------------+--------+ ECA       106     11                                             +----------+--------+--------+--------+----------------------+--------+ +----------+--------+--------+----------------+-------------------+           PSV cm/sEDV cm/sDescribe        Arm Pressure (mmHG) +----------+--------+--------+----------------+-------------------+ [id number]             Multiphasic, [id number]                  +----------+--------+--------+----------------+-------------------+ +---------+--------+--+--------+--+---------+ VertebralPSV cm/s35EDV cm/s12Antegrade +---------+--------+--+--------+--+---------+   Summary: Right Carotid: Velocities in the right ICA are consistent with a 1-39% stenosis. Left Carotid: Velocities in the left ICA are consistent with a 1-39% stenosis. Vertebrals:  Bilateral vertebral arteries demonstrate antegrade flow. Subclavians: Normal flow hemodynamics were seen in bilateral subclavian              arteries. *See table(s) above for measurements and observations.  Electronically signed by Dina Rich MD on 05/08/2022 at 7:55:26 PM.    Final    DG Thoracic Spine W/Swimmers  Result Date: 12/31/2022 CLINICAL DATA:  Upper back pain for 2 days EXAM: THORACIC SPINE - 3 VIEWS COMPARISON:  None Available. FINDINGS: No evidence of thoracic spine fracture. Alignment is normal. Mild-to-moderate multilevel degenerative disc disease of the cervical and thoracic spine. No other significant bone abnormalities are identified. IMPRESSION: 1. No radiographic evidence of acute thoracic spine injury. 2. Mild-to-moderate multilevel degenerative disc disease of the cervical and thoracic spine. Electronically Signed   By: Allegra Lai M.D.   On: 12/31/2022 16:53     Assessment & Plan:   Acute midline thoracic back pain -     Meloxicam; Take 1 tablet (15 mg total) by mouth daily.  Dispense: 30 tablet; Refill: 0  Idiopathic hypotension- Will evaluate for adrenal insufficiency. -     Cortisol; Future  DDD (degenerative disc disease), thoracic -     Meloxicam; Take 1 tablet (15 mg total) by mouth daily.  Dispense: 30 tablet; Refill: 0     Follow-up: Return in about 3 months (around 04/02/2023).  Sanda Linger, MD

## 2023-01-02 LAB — CORTISOL: Cortisol, Plasma: 7.6 ug/dL

## 2023-01-07 ENCOUNTER — Ambulatory Visit (INDEPENDENT_AMBULATORY_CARE_PROVIDER_SITE_OTHER): Payer: Medicare Other | Admitting: Family Medicine

## 2023-01-07 ENCOUNTER — Encounter (INDEPENDENT_AMBULATORY_CARE_PROVIDER_SITE_OTHER): Payer: Self-pay | Admitting: Family Medicine

## 2023-01-07 VITALS — BP 94/67 | HR 75 | Temp 98.1°F | Ht 68.0 in | Wt 205.0 lb

## 2023-01-07 DIAGNOSIS — E669 Obesity, unspecified: Secondary | ICD-10-CM | POA: Diagnosis not present

## 2023-01-07 DIAGNOSIS — E559 Vitamin D deficiency, unspecified: Secondary | ICD-10-CM

## 2023-01-07 DIAGNOSIS — R7303 Prediabetes: Secondary | ICD-10-CM

## 2023-01-07 DIAGNOSIS — Z6831 Body mass index (BMI) 31.0-31.9, adult: Secondary | ICD-10-CM

## 2023-01-07 DIAGNOSIS — Z6832 Body mass index (BMI) 32.0-32.9, adult: Secondary | ICD-10-CM

## 2023-01-07 DIAGNOSIS — E039 Hypothyroidism, unspecified: Secondary | ICD-10-CM

## 2023-01-07 MED ORDER — VITAMIN D (ERGOCALCIFEROL) 1.25 MG (50000 UNIT) PO CAPS: 50000.0000 [IU] | ORAL_CAPSULE | ORAL | 0 refills | Status: DC

## 2023-01-07 NOTE — Progress Notes (Signed)
Jay Holmes, D.O.  ABFM, ABOM Specializing in Clinical Bariatric Medicine  Office located at: [address] [place], [place]  [address]     Assessment and Plan:   Medications Discontinued During This Encounter  Medication Reason   Vitamin D, Ergocalciferol, (DRISDOL) 1.25 MG (50000 UNIT) CAPS capsule Reorder    Meds ordered this encounter  Medications   Vitamin D, Ergocalciferol, (DRISDOL) 1.25 MG (50000 UNIT) CAPS capsule    Sig: Take 1 capsule (50,000 Units total) by mouth every 7 (seven) days.    Dispense:  4 capsule    Refill:  0    Acquired hypothyroidism Assessment: Condition is low-normal. This is well controlled with Synthroid daily. He tolerates this well and denies any adverse side effects at this time.  Lab Results  Component Value Date   TSH 0.690 11/26/2022   T3TOTAL 119 11/26/2022   FREET4 1.42 11/26/2022   Plan: - Continue Synthroid 100 mcg daily.   - We will monitor levels regularly (every 3-4 mo on average) to make sure levels are within the normal limits.   Prediabetes Assessment: Condition is Not at goal. This is diet/exercise controlled. His A1c levels have remained at 6.3 for the past 10 months. He endorses having hunger and cravings.  Lab Results  Component Value Date   HGBA1C 6.3 (H) 11/26/2022   HGBA1C 6.3 02/20/2022   HGBA1C 6.2 02/07/2021   INSULIN 25.6 (H) 11/26/2022    Plan: - Continue his prudent nutritional plan that is low in simple carbohydrates, saturated fats and trans fats to goal of 5-10% weight loss to achieve significant health benefits.  Pt encouraged to continually advance exercise and cardiovascular fitness as tolerated throughout weight loss journey.   - Continue to increase fiber and proteins -> follow his meal plan.    - Explained role of simple carbs and insulin levels on hunger and cravings  - Anticipatory guidance given.    - We will recheck A1c and fasting insulin level in approximately 3 months from last  check, or as deemed appropriate.    Vitamin D deficiency Assessment: Condition is Not at goal. Pt vitamin D level dropped from 40.27 to 30.3. He now takes ERGO and tolerates this well. He denies any adverse side effects.  Lab Results  Component Value Date   VD25OH 30.3 11/26/2022   VD25OH 40.27 10/12/2019   Plan: - Continue with Ergocalciferol 50K IU weekly and he was encouraged to continue to take the medicine until told otherwise. I will refill today.   - We will continue to monitor pt vitamin D levels regularly every 3-4 mo on average months from last check.    TREATMENT PLAN FOR OBESITY: BMI 32.0-32.9,adult - current BMI 31.18 Obesity (BMI 30.0-34.9)-starting bmi 11/26/22 32.39 Assessment:  Jay Holmes is here to discuss his progress with his obesity treatment plan along with follow-up of his obesity related diagnoses. See Medical Weight Management Flowsheet for complete bioelectrical impedance results.  Condition is not optimized. Biometric data collected today, was reviewed with patient.   Since last office visit on 12/09/2022 patient's  Muscle mass has decreased by 1.6lb. Fat mass has decreased by 0.8lb. Total body water has increased by 0.8lb.  Counseling done on how various foods will affect these numbers and how to maximize success  Total lbs lost to date: 8 Total weight loss percentage to date: 3.76%  Plan:  - Deandrae will continue to work on healthier eating habits and try to follow the Category 2  nutritional plan with 100 snack calories best they can.   - I reminded him that consuming protein helps keep him full, speeds up his metabolism, and maintains his muscle mass.   Behavioral Intervention Additional resources provided today: patient declined Evidence-based interventions for health behavior change were utilized today including the discussion of self monitoring techniques, problem-solving barriers and SMART goal setting techniques.   Regarding patient's less  desirable eating habits and patterns, we employed the technique of small changes.  Pt will specifically work on: Increase/eat all of his protein for next visit.     He has agreed to Continue current level of physical activity    FOLLOW UP: Return in about 3 weeks (around 01/28/2023).  He was informed of the importance of frequent follow up visits to maximize his success with intensive lifestyle modifications for his multiple health conditions.  Subjective:   Chief complaint: Obesity [name] is here to discuss his progress with his obesity treatment plan. He is on the the Category 2 Plan with 100 snack calories and states he is following his eating plan approximately 90% of the time. He states he is walking 60 minutes 5-6 days per week and going to the gym 60 minutes 3-4 days per week.  Interval History:  Jay Holmes is here for a follow up office visit.     Since last office visit he has been doing well. He informed me that he only eats off plan majority when going to baseball games. He does not eat his full 6-8oz of dinner before he goes to games. He states that he and his wife have improved on his portion control but is not be getting the full portions of veggies and proteins. He has increase and improved on his water intake and averages multiple glasses of water a day but does not measure the oz.     Review of Systems:  Pertinent positives were addressed with patient today.  Reviewed by clinician on day of visit: allergies, medications, problem list, medical history, surgical history, family history, social history, and previous encounter notes.  Weight Summary and Biometrics   Weight Lost Since Last Visit: 3lb  Weight Gained Since Last Visit: 0   Vitals Temp: 98.1 F (36.7 C) BP: 94/67 Pulse Rate: 75 SpO2: 96 %   Anthropometric Measurements Height: 5\' 8"  (1.727 m) Weight: 205 lb (93 kg) BMI (Calculated): 31.18 Weight at Last Visit: 208lb Weight Lost Since Last  Visit: 3lb Weight Gained Since Last Visit: 0 Starting Weight: 213lb Total Weight Loss (lbs): 8 lb (3.629 kg) Peak Weight: 217lb   Body Composition  Body Fat %: 31.6 % Fat Mass (lbs): 65 lbs Muscle Mass (lbs): 113.8 lbs Total Body Water (lbs): 102.4 lbs Visceral Fat Rating : 18   Other Clinical Data Fasting: no Labs: no Today's Visit #: 3 Starting Date: 11/26/22     Objective:   PHYSICAL EXAM: Blood pressure 94/67, pulse 75, temperature 98.1 F (36.7 C), height 5\' 8"  (1.727 m), weight 205 lb (93 kg), SpO2 96%. Body mass index is 31.17 kg/m.  General: Well Developed, well nourished, and in no acute distress.  HEENT: Normocephalic, atraumatic Skin: Warm and dry, cap RF less 2 sec, good turgor Chest:  Normal excursion, shape, no gross abn Respiratory: speaking in full sentences, no conversational dyspnea NeuroM-Sk: Ambulates w/o assistance, moves * 4 Psych: A and O *3, insight good, mood-full  DIAGNOSTIC DATA REVIEWED:  BMET    Component Value Date/Time   NA 142 11/26/2022  1427   K 4.3 11/26/2022 1427   CL 102 11/26/2022 1427   CO2 24 11/26/2022 1427   GLUCOSE 99 11/26/2022 1427   GLUCOSE 110 (H) 04/30/2022 1506   BUN 19 11/26/2022 1427   CREATININE 0.93 11/26/2022 1427   CALCIUM 9.6 11/26/2022 1427   GFRNONAA >60 05/04/2018 0519   GFRAA >60 05/04/2018 0519   Lab Results  Component Value Date   HGBA1C 6.3 (H) 11/26/2022   HGBA1C 5.9 04/06/2017   Lab Results  Component Value Date   INSULIN 25.6 (H) 11/26/2022   Lab Results  Component Value Date   TSH 0.690 11/26/2022   CBC    Component Value Date/Time   WBC 5.4 11/26/2022 1427   WBC 6.6 04/30/2022 1506   RBC 5.02 11/26/2022 1427   RBC 5.11 04/30/2022 1506   HGB 16.0 11/26/2022 1427   HCT 48.6 11/26/2022 1427   PLT 193 11/26/2022 1427   MCV 97 11/26/2022 1427   MCH 31.9 11/26/2022 1427   MCH 30.9 05/04/2018 0519   MCHC 32.9 11/26/2022 1427   MCHC 33.5 04/30/2022 1506   RDW 14.1 11/26/2022  1427   Iron Studies No results found for: "IRON", "TIBC", "FERRITIN", "IRONPCTSAT" Lipid Panel     Component Value Date/Time   CHOL 156 11/26/2022 1427   TRIG 117 11/26/2022 1427   HDL 41 11/26/2022 1427   CHOLHDL 3 05/20/2021 1121   VLDL 12.4 05/20/2021 1121   LDLCALC 94 11/26/2022 1427   LDLDIRECT 74.0 02/07/2021 1414   Hepatic Function Panel     Component Value Date/Time   PROT 7.0 11/26/2022 1427   ALBUMIN 4.3 11/26/2022 1427   AST 28 11/26/2022 1427   ALT 39 11/26/2022 1427   ALKPHOS 120 11/26/2022 1427   BILITOT 0.6 11/26/2022 1427   BILIDIR 0.1 07/19/2019 1205      Component Value Date/Time   TSH 0.690 11/26/2022 1427   Nutritional Lab Results  Component Value Date   VD25OH 30.3 11/26/2022   VD25OH 40.27 10/12/2019    Attestations:   I, Clinical biochemist, acting as a Stage manager for [employer], DO., have compiled all relevant documentation for today's office visit on behalf of Thomasene Lot, DO, while in the presence of [employer], DO.  I have reviewed the above documentation for accuracy and completeness, and I agree with the above. Jay Holmes, D.O.  The 21st Century Cures Act was signed into law in 2016 which includes the topic of electronic health records.  This provides immediate access to information in MyChart.  This includes consultation notes, operative notes, office notes, lab results and pathology reports.  If you have any questions about what you read please let us know at your next visit so we can discuss your concerns and take corrective action if need be.  We are right here with you.

## 2023-01-08 ENCOUNTER — Other Ambulatory Visit: Payer: Self-pay | Admitting: Internal Medicine

## 2023-01-13 NOTE — Telephone Encounter (Signed)
Patient called - he has 12 days left

## 2023-01-16 ENCOUNTER — Other Ambulatory Visit: Payer: Self-pay | Admitting: Internal Medicine

## 2023-01-20 ENCOUNTER — Other Ambulatory Visit: Payer: Self-pay | Admitting: Internal Medicine

## 2023-01-20 MED ORDER — VILAZODONE HCL 20 MG PO TABS: 20.0000 mg | ORAL_TABLET | Freq: Every day | ORAL | 0 refills | Status: DC

## 2023-01-20 NOTE — Progress Notes (Signed)
OK ths Sch OV

## 2023-01-27 NOTE — Progress Notes (Addendum)
This encounter was created in error - please disregard.  I called and no answer.  I left a detailed message for patient to call office to reschedule.  Medical screening examination/treatment/procedure(s) were performed by non-physician practitioner and as supervising physician I was immediately available for consultation/collaboration.  I agree with above. Jacinta Shoe, MD

## 2023-01-28 ENCOUNTER — Ambulatory Visit (INDEPENDENT_AMBULATORY_CARE_PROVIDER_SITE_OTHER): Payer: Medicare Other | Admitting: Family Medicine

## 2023-02-10 ENCOUNTER — Encounter: Payer: Self-pay | Admitting: Internal Medicine

## 2023-02-10 ENCOUNTER — Ambulatory Visit (INDEPENDENT_AMBULATORY_CARE_PROVIDER_SITE_OTHER): Payer: Medicare Other | Admitting: Internal Medicine

## 2023-02-10 VITALS — BP 110/70 | HR 86 | Temp 98.6°F | Ht 68.0 in | Wt 207.0 lb

## 2023-02-10 DIAGNOSIS — E669 Obesity, unspecified: Secondary | ICD-10-CM

## 2023-02-10 DIAGNOSIS — E039 Hypothyroidism, unspecified: Secondary | ICD-10-CM

## 2023-02-10 DIAGNOSIS — Z6831 Body mass index (BMI) 31.0-31.9, adult: Secondary | ICD-10-CM | POA: Diagnosis not present

## 2023-02-10 DIAGNOSIS — N320 Bladder-neck obstruction: Secondary | ICD-10-CM | POA: Diagnosis not present

## 2023-02-10 DIAGNOSIS — Z0000 Encounter for general adult medical examination without abnormal findings: Secondary | ICD-10-CM | POA: Diagnosis not present

## 2023-02-10 LAB — URINALYSIS
Bilirubin Urine: NEGATIVE
Hgb urine dipstick: NEGATIVE
Ketones, ur: NEGATIVE
Leukocytes,Ua: NEGATIVE
Nitrite: NEGATIVE
Specific Gravity, Urine: 1.025 (ref 1.000–1.030)
Total Protein, Urine: NEGATIVE
Urine Glucose: NEGATIVE
Urobilinogen, UA: 0.2 (ref 0.0–1.0)
pH: 6 (ref 5.0–8.0)

## 2023-02-10 LAB — PSA: PSA: 0.31 ng/mL (ref 0.10–4.00)

## 2023-02-10 NOTE — Progress Notes (Signed)
Subjective:  Patient ID: Jay Holmes, male    DOB: 07/14/1955  Age: 67 y.o. MRN: [id number]  CC: Annual Exam   HPI Jay Holmes presents for a well exam  Outpatient Medications Prior to Visit  Medication Sig Dispense Refill   bimatoprost (LUMIGAN) 0.01 % SOLN Place 1 drop into both eyes at bedtime.     Brimonidine Tartrate-Timolol (COMBIGAN OP) Apply 1 drop to eye 2 (two) times daily.      ketoconazole (NIZORAL) 2 % cream Apply 1 application topically daily as needed (rosacea).     levothyroxine (SYNTHROID) 100 MCG tablet TAKE 1 TABLET BY MOUTH DAILY 90 tablet 3   MegaRed Omega-3 Krill Oil 500 MG CAPS Take 1 capsule by mouth every morning. 100 capsule 3   meloxicam (MOBIC) 15 MG tablet Take 1 tablet (15 mg total) by mouth daily. 30 tablet 0   omeprazole (PRILOSEC) 40 MG capsule TAKE 1 CAPSULE BY MOUTH DAILY 90 capsule 3   Vilazodone HCl 20 MG TABS Take 1 tablet (20 mg total) by mouth daily. 90 tablet 0   Vitamin D, Ergocalciferol, (DRISDOL) 1.25 MG (50000 UNIT) CAPS capsule Take 1 capsule (50,000 Units total) by mouth every 7 (seven) days. 4 capsule 0   No facility-administered medications prior to visit.    ROS: Review of Systems  Constitutional:  Negative for appetite change, fatigue and unexpected weight change.  HENT:  Negative for congestion, nosebleeds, sneezing, sore throat and trouble swallowing.   Eyes:  Negative for itching and visual disturbance.  Respiratory:  Negative for cough.   Cardiovascular:  Negative for chest pain, palpitations and leg swelling.  Gastrointestinal:  Negative for abdominal distention, blood in stool, diarrhea and nausea.  Genitourinary:  Negative for frequency and hematuria.  Musculoskeletal:  Negative for back pain, gait problem, joint swelling and neck pain.  Skin:  Negative for rash.  Neurological:  Negative for dizziness, tremors, speech difficulty and weakness.  Psychiatric/Behavioral:  Negative for agitation, dysphoric mood and sleep  disturbance. The patient is not nervous/anxious.     Objective:  BP 110/70 (BP Location: Left Arm, Patient Position: Sitting, Cuff Size: Normal)   Pulse 86   Temp 98.6 F (37 C) (Oral)   Ht 5\' 8"  (1.727 m)   Wt 207 lb (93.9 kg)   SpO2 97%   BMI 31.47 kg/m   BP Readings from Last 3 Encounters:  02/10/23 110/70  01/07/23 94/67  12/31/22 102/62    Wt Readings from Last 3 Encounters:  02/10/23 207 lb (93.9 kg)  01/07/23 205 lb (93 kg)  12/31/22 210 lb (95.3 kg)    Physical Exam Constitutional:      General: He is not in acute distress.    Appearance: He is well-developed. He is obese.     Comments: NAD  Eyes:     Conjunctiva/sclera: Conjunctivae normal.     Pupils: Pupils are equal, round, and reactive to light.  Neck:     Thyroid: No thyromegaly.     Vascular: No JVD.  Cardiovascular:     Rate and Rhythm: Normal rate and regular rhythm.     Heart sounds: Normal heart sounds. No murmur heard.    No friction rub. No gallop.  Pulmonary:     Effort: Pulmonary effort is normal. No respiratory distress.     Breath sounds: Normal breath sounds. No wheezing or rales.  Chest:     Chest wall: No tenderness.  Abdominal:     General: Bowel sounds  are normal. There is no distension.     Palpations: Abdomen is soft. There is no mass.     Tenderness: There is no abdominal tenderness. There is no guarding or rebound.  Genitourinary:    Rectum: Normal. Guaiac result negative.  Musculoskeletal:        General: No tenderness. Normal range of motion.     Cervical back: Normal range of motion.  Lymphadenopathy:     Cervical: No cervical adenopathy.  Skin:    General: Skin is warm and dry.     Findings: No rash.  Neurological:     Mental Status: He is alert and oriented to person, place, and time.     Cranial Nerves: No cranial nerve deficit.     Motor: No abnormal muscle tone.     Coordination: Coordination normal.     Gait: Gait normal.     Deep Tendon Reflexes: Reflexes  are normal and symmetric.  Psychiatric:        Behavior: Behavior normal.        Thought Content: Thought content normal.        Judgment: Judgment normal.   Prostate 1+  Lab Results  Component Value Date   WBC 5.4 11/26/2022   HGB 16.0 11/26/2022   HCT 48.6 11/26/2022   PLT 193 11/26/2022   GLUCOSE 99 11/26/2022   CHOL 156 11/26/2022   TRIG 117 11/26/2022   HDL 41 11/26/2022   LDLDIRECT 74.0 02/07/2021   LDLCALC 94 11/26/2022   ALT 39 11/26/2022   AST 28 11/26/2022   NA 142 11/26/2022   K 4.3 11/26/2022   CL 102 11/26/2022   CREATININE 0.93 11/26/2022   BUN 19 11/26/2022   CO2 24 11/26/2022   TSH 0.690 11/26/2022   PSA 0.26 02/07/2021   INR 0.94 04/28/2018   HGBA1C 6.3 (H) 11/26/2022    VAS US CAROTID  Result Date: 05/08/2022 Carotid Arterial Duplex Study Patient Name:  Jay Holmes  Date of Exam:   05/08/2022 Medical Rec #: [id number]       Accession #:    [id number] Date of Birth: 07-10-1955       Patient Gender: M Patient Age:   53 years Exam Location:  [facility] Procedure:      VAS US CAROTID Referring Phys: Larene Beach HENSON --------------------------------------------------------------------------------  Indications:       Dizziness per order. Patient reports acute onset of "flush"                    feeling accompanied with cold sweat all over his body, with                    cold sweat particularly on his head lasting for at least 30                    seconds to 1 minute. During that time he was panicked and                    cannot recall if he was experiencing any other symptoms. Risk Factors:      No history of smoking, coronary artery disease. Comparison Study:  NA Performing Technologist: Tyna Jaksch RVT  Examination Guidelines: A complete evaluation includes B-mode imaging, spectral Doppler, color Doppler, and power Doppler as needed of all accessible portions of each vessel. Bilateral testing is considered an integral part of a complete examination. Limited  examinations for reoccurring indications may be performed  as noted.  Right Carotid Findings: +----------+--------+--------+--------+------------------+--------+           PSV cm/sEDV cm/sStenosisPlaque DescriptionComments +----------+--------+--------+--------+------------------+--------+ CCA Prox  85      19                                         +----------+--------+--------+--------+------------------+--------+ CCA Distal72      19                                         +----------+--------+--------+--------+------------------+--------+ ICA Prox  60      13      1-39%   heterogenous               +----------+--------+--------+--------+------------------+--------+ ICA Mid   67      27                                         +----------+--------+--------+--------+------------------+--------+ ICA Distal66      24                                         +----------+--------+--------+--------+------------------+--------+ ECA       101     9                                          +----------+--------+--------+--------+------------------+--------+ +----------+--------+-------+----------------+-------------------+           PSV cm/sEDV cmsDescribe        Arm Pressure (mmHG) +----------+--------+-------+----------------+-------------------+ [id number]            Multiphasic, [id number]                 +----------+--------+-------+----------------+-------------------+ +---------+--------+--+--------+--+---------+ VertebralPSV cm/s47EDV cm/s12Antegrade +---------+--------+--+--------+--+---------+  Left Carotid Findings: +----------+--------+--------+--------+----------------------+--------+           PSV cm/sEDV cm/sStenosisPlaque Description    Comments +----------+--------+--------+--------+----------------------+--------+ CCA Prox  96      22                                              +----------+--------+--------+--------+----------------------+--------+ CCA Distal74      19                                             +----------+--------+--------+--------+----------------------+--------+ ICA Prox  93      17      1-39%   heterogenous and focal         +----------+--------+--------+--------+----------------------+--------+ ICA Mid   68      25                                    tortuous +----------+--------+--------+--------+----------------------+--------+ ICA Distal65      27  tortuous +----------+--------+--------+--------+----------------------+--------+ ECA       106     11                                             +----------+--------+--------+--------+----------------------+--------+ +----------+--------+--------+----------------+-------------------+           PSV cm/sEDV cm/sDescribe        Arm Pressure (mmHG) +----------+--------+--------+----------------+-------------------+ [id number]             Multiphasic, [id number]                 +----------+--------+--------+----------------+-------------------+ +---------+--------+--+--------+--+---------+ VertebralPSV cm/s35EDV cm/s12Antegrade +---------+--------+--+--------+--+---------+   Summary: Right Carotid: Velocities in the right ICA are consistent with a 1-39% stenosis. Left Carotid: Velocities in the left ICA are consistent with a 1-39% stenosis. Vertebrals:  Bilateral vertebral arteries demonstrate antegrade flow. Subclavians: Normal flow hemodynamics were seen in bilateral subclavian              arteries. *See table(s) above for measurements and observations.  Electronically signed by Dina Rich MD on 05/08/2022 at 7:55:26 PM.    Final     Assessment & Plan:   Problem List Items Addressed This Visit     Hypothyroidism - Primary    Cont on Levothyroxine      Obesity (BMI 30-39.9)    Wt Readings from Last 3 Encounters:   02/10/23 207 lb (93.9 kg)  01/07/23 205 lb (93 kg)  12/31/22 210 lb (95.3 kg)  F/u w/[facility] wt management program       Well adult exam     We discussed age appropriate health related issues, including available/recomended screening tests and vaccinations. Labs were ordered to be later reviewed . All questions were answered. We discussed one or more of the following - seat belt use, use of sunscreen/sun exposure exercise, fall risk reduction, second hand smoke exposure, firearm use and storage, seat belt use, a need for adhering to healthy diet and exercise. Labs were ordered.  All questions were answered. Last colon 2021.   Next due on 01/15/2030 Dr Rhea Belton   Coronary calcium CT score of 82 - 2022 6/22 CT score 82.7 Statin or fish oil advised      Relevant Orders   PSA   Urinalysis   Other Visit Diagnoses     Bladder neck obstruction       Relevant Orders   PSA   Urinalysis         No orders of the defined types were placed in this encounter.     Follow-up: Return in about 6 months (around 08/11/2023) for a follow-up visit.  Sonda Primes, MD

## 2023-02-10 NOTE — Assessment & Plan Note (Addendum)
  We discussed age appropriate health related issues, including available/recomended screening tests and vaccinations. Labs were ordered to be later reviewed . All questions were answered. We discussed one or more of the following - seat belt use, use of sunscreen/sun exposure exercise, fall risk reduction, second hand smoke exposure, firearm use and storage, seat belt use, a need for adhering to healthy diet and exercise. Labs were ordered.  All questions were answered. Last colon 2021.   Next due on 01/15/2030 Dr Rhea Belton   Coronary calcium CT score of 82 - 2022 6/22 CT score 82.7 Statin or fish oil advised

## 2023-02-10 NOTE — Assessment & Plan Note (Signed)
Wt Readings from Last 3 Encounters:  02/10/23 207 lb (93.9 kg)  01/07/23 205 lb (93 kg)  12/31/22 210 lb (95.3 kg)  F/u w/[facility] wt management program

## 2023-02-10 NOTE — Assessment & Plan Note (Signed)
Cont on Levothyroxine 

## 2023-02-12 ENCOUNTER — Other Ambulatory Visit (INDEPENDENT_AMBULATORY_CARE_PROVIDER_SITE_OTHER): Payer: Self-pay | Admitting: Family Medicine

## 2023-02-24 ENCOUNTER — Ambulatory Visit (INDEPENDENT_AMBULATORY_CARE_PROVIDER_SITE_OTHER): Payer: Medicare Other | Admitting: Radiology

## 2023-02-24 DIAGNOSIS — Z23 Encounter for immunization: Secondary | ICD-10-CM

## 2023-02-24 NOTE — Progress Notes (Cosign Needed Addendum)
Patient here for HD flu shot. Patient tolerated well with no complications.  Medical screening examination/treatment/procedure(s) were performed by non-physician practitioner and as supervising physician I was immediately available for consultation/collaboration.  I agree with above. Jacinta Shoe, MD

## 2023-02-25 ENCOUNTER — Ambulatory Visit (INDEPENDENT_AMBULATORY_CARE_PROVIDER_SITE_OTHER): Payer: Medicare Other | Admitting: Family Medicine

## 2023-03-16 ENCOUNTER — Other Ambulatory Visit: Payer: Self-pay | Admitting: Internal Medicine

## 2023-03-30 ENCOUNTER — Ambulatory Visit (INDEPENDENT_AMBULATORY_CARE_PROVIDER_SITE_OTHER): Payer: Medicare Other | Admitting: Family Medicine

## 2023-04-08 ENCOUNTER — Encounter (INDEPENDENT_AMBULATORY_CARE_PROVIDER_SITE_OTHER): Payer: Self-pay | Admitting: Family Medicine

## 2023-04-08 ENCOUNTER — Ambulatory Visit (INDEPENDENT_AMBULATORY_CARE_PROVIDER_SITE_OTHER): Payer: Medicare Other | Admitting: Family Medicine

## 2023-04-08 VITALS — BP 114/79 | HR 77 | Temp 98.2°F | Ht 68.0 in | Wt 205.0 lb

## 2023-04-08 DIAGNOSIS — R7303 Prediabetes: Secondary | ICD-10-CM | POA: Diagnosis not present

## 2023-04-08 DIAGNOSIS — E66811 Obesity, class 1: Secondary | ICD-10-CM

## 2023-04-08 DIAGNOSIS — Z6831 Body mass index (BMI) 31.0-31.9, adult: Secondary | ICD-10-CM | POA: Diagnosis not present

## 2023-04-08 DIAGNOSIS — E669 Obesity, unspecified: Secondary | ICD-10-CM

## 2023-04-08 DIAGNOSIS — E559 Vitamin D deficiency, unspecified: Secondary | ICD-10-CM | POA: Diagnosis not present

## 2023-04-08 DIAGNOSIS — Z6832 Body mass index (BMI) 32.0-32.9, adult: Secondary | ICD-10-CM

## 2023-04-08 NOTE — Progress Notes (Signed)
Jay Holmes, D.O.  ABFM, ABOM Specializing in Clinical Bariatric Medicine  Office located at: [address] [place], [place]  [address]   Assessment and Plan:   FOR THE DISEASE OF OBESITY: Obesity (BMI 30.0-34.9)-starting bmi 11/26/22 32.39 BMI 32.0-32.9,adult - current BMI 31.18 Since last office visit on 01/07/2023 patient's  Muscle mass has increased by 21.2lb. Fat mass has decreased by 1.4lb. Total body water has increased by 0.6lb.  Counseling done on how various foods will affect these numbers and how to maximize success  Total lbs lost to date: 8 Total weight loss percentage to date: 3.76%    Recommended Dietary Goals Jay Holmes is currently in the action stage of change. As such, his goal is to continue weight management plan.  He has agreed to: continue current plan   Behavioral Intervention We discussed the following today: increasing lean protein intake to established goals, decreasing simple carbohydrates , increasing vegetables, reading food labels , decreasing eating out or consumption of processed foods, and making healthy choices when eating convenient foods, practice mindfulness eating and understand the difference between hunger signals and cravings, continue to work on implementation of reduced calorie nutritional plan, continue to practice mindfulness when eating, planning for success, and better snacking choices  Additional resources provided today: Handout on complex carbohydrates and lean sources of protein  Evidence-based interventions for health behavior change were utilized today including the discussion of self monitoring techniques, problem-solving barriers and SMART goal setting techniques.   Regarding patient's less desirable eating habits and patterns, we employed the technique of small changes.   Pt will specifically work on: Follow the meal plan for next visit.    Recommended Physical Activity Goals Grant has been advised to work up to 150  minutes of moderate intensity aerobic activity a week and strengthening exercises 2-3 times per week for cardiovascular health, weight loss maintenance and preservation of muscle mass.   He has agreed to :  Continue current level of physical activity  and continue to gradually increase the amount and intensity of exercise    Pharmacotherapy We discussed various medication options to help [place] with his weight loss efforts and we both agreed to : continue with nutritional and behavioral strategies   FOR ASSOCIATED CONDITIONS ADDRESSED TODAY: Vitamin D deficiency Assessment: Condition is Not optimized.. Pt informed me that he has not been taking his vitamin D supplement since last OV due to it not being ale to be refilled. He was last seen on 01/07/2023. He now takes a OTC vitamin D supplement.  Lab Results  Component Value Date   VD25OH 30.3 11/26/2022   VD25OH 40.27 10/12/2019   Plan: Restart Ergocalciferol 50K IU weekly.  I reminded pt that we do not give refills until next OV. I also discussed the importance of vitamin D to the patient's health and well-being as well as to their ability to lose weight. Weight loss will likely improve availability of vitamin D, thus encouraged Philips to continue with meal plan and their weight loss efforts to further improve this condition.    Prediabetes Assessment: Condition is Not at goal.. This is diet/exercise controlled. He endorses his hunger and cravings are not controlled as he is not following the meal plan at all. He notes that he still loves to eat.  Lab Results  Component Value Date   HGBA1C 6.3 (H) 11/26/2022   HGBA1C 6.3 02/20/2022   HGBA1C 6.2 02/07/2021   INSULIN 25.6 (H) 11/26/2022   Plan: -  Jay Holmes will continue to work on weight loss, exercise, via their meal plan we devised to help decrease the risk of progressing to diabetes. I explained role of simple carbs and insulin levels on hunger and cravings as he is not eating the right  foods to curb his appetite. Anticipatory guidance and encouragement was given.     Follow up:   Return in about 7 weeks (around 05/25/2023). He was informed of the importance of frequent follow up visits to maximize his success with intensive lifestyle modifications for his multiple health conditions.  Subjective:   Chief complaint: Obesity Cormac is here to discuss his progress with his obesity treatment plan. He is on the the Category 2 Plan with 100 snack calories and states he is following his eating plan approximately 0% of the time. He states he is going to the gym 60 minutes 3-4 days per week and walking 50-60 minutes 3-4 days a week.   Interval History:  Jay Holmes is here for a follow up office visit. Since last OV,  he has been well. He has completely cut out soft drinks and no longer eats cereal right before bed. He is not following the meal plan as he enjoys eating too much and typically over eats. He states that when he watches TV he tends to snack. Pt notes that if it wasn't for his wife then he would be heavier then he is. He states he eats yogurt and bread for breakfast and a sandwich for lunch. He endorses his problem is eating constantly in between meals all day long.   Barriers identified: strong hunger signals and impaired satiety / inhibitory control.   Pharmacotherapy for weight loss: He is currently taking no anti-obesity medication.   Review of Systems:  Pertinent positives were addressed with patient today.  Reviewed by clinician on day of visit: allergies, medications, problem list, medical history, surgical history, family history, social history, and previous encounter notes.  Weight Summary and Biometrics   Weight Lost Since Last Visit: 0lb  Weight Gained Since Last Visit: 0lb    Vitals Temp: 98.2 F (36.8 C) BP: 114/79 Pulse Rate: 77 SpO2: 97 %   Anthropometric Measurements Height: 5\' 8"  (1.727 m) Weight: 205 lb (93 kg) BMI (Calculated):  31.18 Weight at Last Visit: 205lb Weight Lost Since Last Visit: 0lb Weight Gained Since Last Visit: 0lb Starting Weight: 213lb Total Weight Loss (lbs): 8 lb (3.629 kg) Peak Weight: 217lb   Body Composition  Body Fat %: 30.9 % Fat Mass (lbs): 63.6 lbs Muscle Mass (lbs): 135 lbs Total Body Water (lbs): 103 lbs Visceral Fat Rating : 18   Other Clinical Data Fasting: no Labs: no Today's Visit #: 4 Starting Date: 11/26/22    Objective:   PHYSICAL EXAM: Blood pressure 114/79, pulse 77, temperature 98.2 F (36.8 C), height 5\' 8"  (1.727 m), weight 205 lb (93 kg), SpO2 97%. Body mass index is 31.17 kg/m.  General: he is overweight, cooperative and in no acute distress. PSYCH: Has normal mood, affect and thought process.   HEENT: EOMI, sclerae are anicteric. Lungs: Normal breathing effort, no conversational dyspnea. Extremities: Moves * 4 Neurologic: A and O * 3, good insight  DIAGNOSTIC DATA REVIEWED: BMET    Component Value Date/Time   NA 142 11/26/2022 1427   K 4.3 11/26/2022 1427   CL 102 11/26/2022 1427   CO2 24 11/26/2022 1427   GLUCOSE 99 11/26/2022 1427   GLUCOSE 110 (H) 04/30/2022 1506   BUN  19 11/26/2022 1427   CREATININE 0.93 11/26/2022 1427   CALCIUM 9.6 11/26/2022 1427   GFRNONAA >60 05/04/2018 0519   GFRAA >60 05/04/2018 0519   Lab Results  Component Value Date   HGBA1C 6.3 (H) 11/26/2022   HGBA1C 5.9 04/06/2017   Lab Results  Component Value Date   INSULIN 25.6 (H) 11/26/2022   Lab Results  Component Value Date   TSH 0.690 11/26/2022   CBC    Component Value Date/Time   WBC 5.4 11/26/2022 1427   WBC 6.6 04/30/2022 1506   RBC 5.02 11/26/2022 1427   RBC 5.11 04/30/2022 1506   HGB 16.0 11/26/2022 1427   HCT 48.6 11/26/2022 1427   PLT 193 11/26/2022 1427   MCV 97 11/26/2022 1427   MCH 31.9 11/26/2022 1427   MCH 30.9 05/04/2018 0519   MCHC 32.9 11/26/2022 1427   MCHC 33.5 04/30/2022 1506   RDW 14.1 11/26/2022 1427   Iron  Studies No results found for: "IRON", "TIBC", "FERRITIN", "IRONPCTSAT" Lipid Panel     Component Value Date/Time   CHOL 156 11/26/2022 1427   TRIG 117 11/26/2022 1427   HDL 41 11/26/2022 1427   CHOLHDL 3 05/20/2021 1121   VLDL 12.4 05/20/2021 1121   LDLCALC 94 11/26/2022 1427   LDLDIRECT 74.0 02/07/2021 1414   Hepatic Function Panel     Component Value Date/Time   PROT 7.0 11/26/2022 1427   ALBUMIN 4.3 11/26/2022 1427   AST 28 11/26/2022 1427   ALT 39 11/26/2022 1427   ALKPHOS 120 11/26/2022 1427   BILITOT 0.6 11/26/2022 1427   BILIDIR 0.1 07/19/2019 1205      Component Value Date/Time   TSH 0.690 11/26/2022 1427   Nutritional Lab Results  Component Value Date   VD25OH 30.3 11/26/2022   VD25OH 40.27 10/12/2019    Attestations:   I, Clinical biochemist, acting as a Stage manager for [employer], DO., have compiled all relevant documentation for today's office visit on behalf of Thomasene Lot, DO, while in the presence of [employer], DO.  Reviewed by clinician on day of visit: allergies, medications, problem list, medical history, surgical history, family history, social history, and previous encounter notes pertinent to patient's obesity diagnosis. I have spent 44 minutes in the care of the patient today including: preparing to see patient (e.g. review and interpretation of tests, old notes ), obtaining and/or reviewing separately obtained history, performing a medically appropriate examination or evaluation, counseling and educating the patient, ordering medications, test or procedures, documenting clinical information in the electronic or other health care record, and independently interpreting results and communicating results to the patient, family, or caregiver   I have reviewed the above documentation for accuracy and completeness, and I agree with the above. Jay Holmes, D.O.  The 21st Century Cures Act was signed into law in 2016 which includes the  topic of electronic health records.  This provides immediate access to information in MyChart.  This includes consultation notes, operative notes, office notes, lab results and pathology reports.  If you have any questions about what you read please let us know at your next visit so we can discuss your concerns and take corrective action if need be.  We are right here with you.

## 2023-04-17 MED ORDER — VITAMIN D (ERGOCALCIFEROL) 1.25 MG (50000 UNIT) PO CAPS: 50000.0000 [IU] | ORAL_CAPSULE | ORAL | 0 refills | Status: DC

## 2023-05-13 ENCOUNTER — Other Ambulatory Visit: Payer: Self-pay | Admitting: Internal Medicine

## 2023-05-13 DIAGNOSIS — E039 Hypothyroidism, unspecified: Secondary | ICD-10-CM

## 2023-05-24 NOTE — Progress Notes (Signed)
 SUBJECTIVE:  Chief Complaint: Obesity  Interim History: He is up 2 lbs since his last visit. Bio impedence scale reviewed with the patient:  Muscle mass - 0.2 lbs Adipose mass + 2.4 lbs Total body water  - 1 lb  Jay Holmes is a 68 year old individual with a history of obesity, vitamin D  deficiency, and hypothyroidism, presents for a follow-up visit regarding his obesity treatment plan.  The patient self-identifies as a foodaholic, with a particular habit of snacking while watching television, especially sports. He acknowledges this as a detrimental habit and expresses difficulty in establishing healthier eating habits.  The patient's typical snacks include crackers and sweet cereals, although he notes a recent decrease in the consumption of sweet cereals and soft drinks due to his spouse's influence/removing them from the house. Despite these changes, the patient admits to struggling with portion control, often consuming large quantities of these snack like crackers or peanuts. He also expresses a belief that he may have a food addiction.  The patient has made efforts to increase his water  intake, motivated by a history of kidney stones and a desire to prevent future occurrences. He reports no kidney stones in the past three years, attributing this to his increased water  consumption.  In terms of physical activity, the patient regularly attends the gym, primarily focusing on cardio exercises such as the treadmill and bike. He expresses a willingness to incorporate strength training into his routine after discussing its benefits for maintaining muscle mass and promoting weight loss.  The patient's current BMI is 31.5, and he expresses a desire to reduce his weight to under 200 lbs.  He acknowledges the need for better portion control and label reading to improve his dietary habits.  In summary, the patient presents with ongoing struggles with obesity, primarily driven by poor dietary  habits and portion control. He expresses a willingness to make changes, including incorporating strength training into his exercise routine and improving his dietary choices. His history of kidney stones has motivated an increase in water  intake, and he expresses a desire to continue this preventative measure.   Jay Holmes is here to discuss his progress with his obesity treatment plan. He is on the Category 2 Plan and states he is following his eating plan approximately 50 % of the time. He states he is exercising gym 60 minutes 2-3 times per week.   OBJECTIVE: Visit Diagnoses: Problem List Items Addressed This Visit   None Visit Diagnoses       Prediabetes    -  Primary     Vitamin D  deficiency         Obesity (BMI 30.0-34.9)-starting bmi 11/26/22 32.39         Obesity Obesity with challenges in dietary habits and snacking on high-calorie, low-nutrient foods.  Positive changes include increased water  intake and reduced soft drink consumption.  Struggles with portion control and healthier snack choices and we discussed keeping these snacks out the house.  Discussed strength training for muscle mass and metabolism, portion control, mindful eating, Yuka app for better food choices, and Fairlife milk for lower sugar and higher protein intake. - Encourage strength training exercises - Advise on portion control and label reading, focusing on sugar content - Recommend using the Yuka app for scanning food products - Suggest switching to Fairlife milk for lower sugar and higher protein intake - Schedule follow-up in 3-4 weeks to monitor progress   Prediabetes Last A1c was 6.3  Medication(s): None Polyphagia:Yes Lab Results  Component Value  Date   HGBA1C 6.3 (H) 11/26/2022   HGBA1C 6.3 02/20/2022   HGBA1C 6.2 02/07/2021   HGBA1C 6.1 06/26/2020   HGBA1C 5.8 10/12/2019   Lab Results  Component Value Date   INSULIN  25.6 (H) 11/26/2022    Plan:  Continue working on nutrition plan to  decrease simple carbohydrates, increase lean proteins and exercise to promote weight loss, improve glycemic control and prevent progression to Type 2 diabetes.  Discussed trying to follow the Cat 2 meal plan more closely to promote weight loss.  Discussed keeping snack like peanuts and crackers out of the house to avoid over indulging in snacks he has trouble maintaining portion control over.   Vitamin D  Deficiency Vitamin D  is not at goal of 50.  Most recent vitamin D  level was 30.3. He is on  prescription ergocalciferol  50,000 IU weekly. Reports no N/V or muscle weakness with Ergocalciferol .  Lab Results  Component Value Date   VD25OH 30.3 11/26/2022   VD25OH 40.27 10/12/2019    Plan: Continue  prescription ergocalciferol  50,000 IU weekly  No refill requested this visit. Low vitamin D  levels can be associated with adiposity and may result in leptin resistance and weight gain. Also associated with fatigue. Currently on vitamin D  supplementation without any adverse effects.  Recheck vitamin D  levels several times yearly to optimize supplementation/avoid over supplementation.     Kidney Stones No episodes in three years due to increased water  intake. No current issues. Emphasized continued hydration to prevent recurrence. - Continue increased water  intake to prevent recurrence  General Health Maintenance Emphasized balanced diet, regular exercise, strength training to prevent muscle loss, and overall health improvement. - Encourage regular strength training exercises - Advise on balanced diet with focus on protein intake and reducing sugar consumption - Recommend portion control and mindful eating habits  Follow-up - Schedule follow-up in 3-4 weeks.  Vitals Temp: 97.9 F (36.6 C) BP: 99/64 Pulse Rate: 67 SpO2: 98 %   Anthropometric Measurements Height: 5' 8 (1.727 m) Weight: 207 lb (93.9 kg) BMI (Calculated): 31.48 Weight at Last Visit: 205 lb Weight Lost Since Last  Visit: 0 Weight Gained Since Last Visit: 2 lb Starting Weight: 213 lb Total Weight Loss (lbs): 6 lb (2.722 kg) Peak Weight: 217 lb   Body Composition  Body Fat %: 31.5 % Fat Mass (lbs): 65.2 lbs Muscle Mass (lbs): 134.8 lbs Total Body Water  (lbs): 102 lbs Visceral Fat Rating : 18   Other Clinical Data Fasting: no Labs: no Today's Visit #: 5 Starting Date: 11/26/22     ASSESSMENT AND PLAN:  Diet: Jay Holmes is currently in the action stage of change. As such, his goal is to continue with weight loss efforts. He has agreed to Category 2 Plan.  Exercise: Jay Holmes has been instructed to work up to a goal of 150 minutes of combined cardio and strengthening exercise per week for weight loss and overall health benefits.   Behavior Modification:  We discussed the following Behavioral Modification Strategies today: increasing lean protein intake, decreasing simple carbohydrates, increasing vegetables, increase H2O intake, increase high fiber foods, meal planning and cooking strategies, better snacking choices, avoiding temptations, and planning for success. We discussed various medication options to help Jay Holmes with his weight loss efforts and we both agreed to continue to work on nutritional and behavioral strategies to promote weight loss.  .  Return in about 4 weeks (around 06/22/2023).Jay Holmes He was informed of the importance of frequent follow up visits to maximize his success with  intensive lifestyle modifications for his multiple health conditions.  Attestation Statements:   Reviewed by clinician on day of visit: allergies, medications, problem list, medical history, surgical history, family history, social history, and previous encounter notes.   Time spent on visit including pre-visit chart review and post-visit care and charting was 50 minutes.    Kenny Stern, PA-C

## 2023-05-25 ENCOUNTER — Encounter (INDEPENDENT_AMBULATORY_CARE_PROVIDER_SITE_OTHER): Payer: Self-pay | Admitting: Physician Assistant

## 2023-05-25 ENCOUNTER — Ambulatory Visit (INDEPENDENT_AMBULATORY_CARE_PROVIDER_SITE_OTHER): Payer: Medicare Other | Admitting: Physician Assistant

## 2023-05-25 VITALS — BP 99/64 | HR 67 | Temp 97.9°F | Ht 68.0 in | Wt 207.0 lb

## 2023-05-25 DIAGNOSIS — E669 Obesity, unspecified: Secondary | ICD-10-CM

## 2023-05-25 DIAGNOSIS — R7303 Prediabetes: Secondary | ICD-10-CM

## 2023-05-25 DIAGNOSIS — Z6831 Body mass index (BMI) 31.0-31.9, adult: Secondary | ICD-10-CM | POA: Diagnosis not present

## 2023-05-25 DIAGNOSIS — E559 Vitamin D deficiency, unspecified: Secondary | ICD-10-CM | POA: Diagnosis not present

## 2023-05-25 DIAGNOSIS — E66811 Obesity, class 1: Secondary | ICD-10-CM

## 2023-06-22 ENCOUNTER — Ambulatory Visit (INDEPENDENT_AMBULATORY_CARE_PROVIDER_SITE_OTHER): Payer: Medicare Other | Admitting: Physician Assistant

## 2023-07-10 ENCOUNTER — Telehealth: Payer: Self-pay | Admitting: Internal Medicine

## 2023-07-10 ENCOUNTER — Ambulatory Visit: Payer: Self-pay | Admitting: Internal Medicine

## 2023-07-10 ENCOUNTER — Ambulatory Visit: Payer: Medicare Other | Admitting: Internal Medicine

## 2023-07-10 NOTE — Telephone Encounter (Signed)
 Patient is needing a appt to be tested for the flu his wife would like a call back and for him to be tested today

## 2023-07-10 NOTE — Telephone Encounter (Signed)
 Red Word that prompted transfer to Nurse Triage: Patient's spouse believes he may have the flu. He is having body aches, chills, fever, cough all the symptoms started yesterday                 Chief Complaint: Possible flu exposure, has symptoms. Cough, body aches, chills. Symptoms: Above Frequency: This week Pertinent Negatives: Patient denies  Disposition: [] ED /[] Urgent Care (no appt availability in office) / [x] Appointment(In office/virtual)/ []  [facility]/ [] Home Care/ [] Refused Recommended Disposition /[] [facility]/ []  Follow-up with PCP Additional Notes: Agrees with appointment.  Reason for Disposition  Fever present > 3 days (72 hours)  Answer Assessment - Initial Assessment Questions 1. TYPE of EXPOSURE: "How were you exposed?" (e.g., close contact, not a close contact)     Maybe wife 2. DATE of EXPOSURE: "When did the exposure occur?" (e.g., hour, days, weeks)     This week 3. PREGNANCY: "Is there any chance you are pregnant?" "When was your last menstrual period?"     N/a 4. HIGH RISK for COMPLICATIONS: "Do you have any heart or lung problems?" "Do you have a weakened immune system?" (e.g., CHF, COPD, asthma, HIV positive, chemotherapy, renal failure, diabetes mellitus, sickle cell anemia)     no 5. SYMPTOMS: "Do you have any symptoms?" (e.g., cough, fever, sore throat, difficulty breathing).     Body aches, chills, fever, cough  Protocols used: Influenza (Flu) Exposure-A-AH

## 2023-07-27 ENCOUNTER — Ambulatory Visit (INDEPENDENT_AMBULATORY_CARE_PROVIDER_SITE_OTHER): Payer: Medicare Other

## 2023-07-27 VITALS — Ht 68.0 in | Wt 215.0 lb

## 2023-07-27 DIAGNOSIS — Z0000 Encounter for general adult medical examination without abnormal findings: Secondary | ICD-10-CM

## 2023-07-27 NOTE — Progress Notes (Addendum)
 Subjective:   Jay Holmes is a 68 y.o. who presents for a Medicare Wellness preventive visit.  Visit Complete: Virtual I connected with  Jay Holmes on 07/27/23 by a video and audio enabled telemedicine application and verified that I am speaking with the correct person using two identifiers.  Patient Location: Home  Provider Location: Home Office  I discussed the limitations of evaluation and management by telemedicine. The patient expressed understanding and agreed to proceed.  Vital Signs: Because this visit was a virtual/telehealth visit, some criteria may be missing or patient reported. Any vitals not documented were not able to be obtained and vitals that have been documented are patient reported.   Persons Participating in Visit: Patient.  AWV Questionnaire: Yes: Patient Medicare AWV questionnaire was completed by the patient on 07/27/2023; I have confirmed that all information answered by patient is correct and no changes since this date.  Cardiac Risk Factors include: advanced age (>69men, >29 women);Other (see comment), Risk factor comments: CAD,  Atherosclerosis of aorta     Objective:    Today's Vitals   07/27/23 1050  Weight: 215 lb (97.5 kg)  Height: 5\' 8"  (1.727 m)   Body mass index is 32.69 kg/m.     07/27/2023   10:58 AM 02/17/2022    1:14 PM 01/08/2021    9:59 AM 05/03/2018    2:25 PM 04/28/2018   11:06 AM 10/29/2016    8:32 AM 08/07/2015    9:39 PM  Advanced Directives  Does Patient Have a Medical Advance Directive? No Yes Yes No No No No  Type of Special educational needs teacher of [place];Living will Healthcare Power of [place];Living will      Does patient want to make changes to medical advance directive?   No - Patient declined      Copy of Healthcare Power of Attorney in Chart?  No - copy requested No - copy requested      Would patient like information on creating a medical advance directive?    No - Patient declined No - Patient  declined      Current Medications (verified) Outpatient Encounter Medications as of 07/27/2023  Medication Sig   Bimatoprost (LUMIGAN OP) Apply 2.5 mLs to eye.   Brimonidine Tartrate-Timolol (COMBIGAN OP) Apply 5 mLs to eye.   doxycycline (MONODOX) 100 MG capsule Take 100 mg by mouth daily.   Glucosamine-Chondroit-Vit C-Mn (GLUCOSAMINE CHONDR 1500 COMPLX PO) Take by mouth. Chondroitin 1200mg  2 tablets daily   ketoconazole (NIZORAL) 2 % cream Apply 1 application  topically daily as needed (rosacea).   levothyroxine (SYNTHROID) 100 MCG tablet TAKE 1 TABLET BY MOUTH DAILY   omeprazole (PRILOSEC) 40 MG capsule TAKE 1 CAPSULE BY MOUTH DAILY   Vilazodone HCl 20 MG TABS TAKE 1 TABLET BY MOUTH DAILY   VITAMIN D, CHOLECALCIFEROL, PO Take by mouth. OTC   Vitamin D, Ergocalciferol, (DRISDOL) 1.25 MG (50000 UNIT) CAPS capsule Take 1 capsule (50,000 Units total) by mouth every 7 (seven) days.   No facility-administered encounter medications on file as of 07/27/2023.    Allergies (verified) Patient has no known allergies.   History: Past Medical History:  Diagnosis Date   Arthritis    Diastasis recti 07/21/2012   questionable umbilical hernia   GERD (gastroesophageal reflux disease)    Glaucoma    Hernia of abdominal wall    Personal history of diseases of skin and subcutaneous tissue    Personal history of urinary calculi  Unspecified hypothyroidism    Unspecified sinusitis (chronic)    Past Surgical History:  Procedure Laterality Date   Cataract surgery Bilateral    COLONOSCOPY      TONSILLECTOMY AND ADENOIDECTOMY     TOTAL KNEE ARTHROPLASTY Right 05/03/2018   Procedure: TOTAL KNEE ARTHROPLASTY;  Surgeon: Ollen Gross, MD;  Location: [facility];  Service: Orthopedics;  Laterality: Right;   Family History  Problem Relation Age of Onset   Memory loss Mother    Hypertension Mother    Hyperlipidemia Mother    Cancer Mother        breast cancer   Cancer Father        prostate    Diabetes Father        diet controlled   Heart disease Father    Cleft lip Son    Heart disease Maternal Grandmother    Colon cancer Neg Hx    Social History   Socioeconomic History   Marital status: Married    Spouse name: Jay Holmes   Number of children: 2   Years of education: 15   Highest education level: Associate degree: academic program  Occupational History   Occupation: Immunologist - retired    Associate Professor: Advice worker    Comment: [employer]  Tobacco Use   Smoking status: Never   Smokeless tobacco: Never  Vaping Use   Vaping status: Never Used  Substance and Sexual Activity   Alcohol use: No   Drug use: No   Sexual activity: Yes    Partners: Female  Other Topics Concern   Not on file  Social History Narrative   [employer]-acctng. 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 1-daughter 1999, 1 son 2003 (cleft lip/palate). wife is healthy.  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 and 1 son   Social Drivers of Corporate investment banker Strain: Low Risk  (07/27/2023)   Overall Financial Resource Strain (CARDIA)    Difficulty of Paying Living Expenses: Not hard at all  Food Insecurity: No Food Insecurity (07/27/2023)   Hunger Vital Sign    Worried About Running Out of Food in the Last Year: Never true    Ran Out of Food in the Last Year: Never true  Transportation Needs: No Transportation Needs (07/27/2023)   PRAPARE - Administrator, Civil Service (Medical): No    Lack of Transportation (Non-Medical): No  Physical Activity: Insufficiently Active (07/27/2023)   Exercise Vital Sign    Days of Exercise per Week: 4 days    Minutes of Exercise per Session: 30 min  Stress: No Stress Concern Present (07/27/2023)   [employer] of Occupational Health - Occupational Stress Questionnaire    Feeling of Stress : Not at all  Social Connections: Moderately Integrated (07/27/2023)   Social Connection and Isolation Panel [NHANES]    Frequency of  Communication with Friends and Family: More than three times a week    Frequency of Social Gatherings with Friends and Family: Not on file    Attends Religious Services: More than 4 times per year    Active Member of [employer] or Organizations: No    Attends Engineer, structural: Not on file    Marital Status: Married    Tobacco Counseling Counseling given: Not Answered    Clinical Intake:  Pre-visit preparation completed: Yes  Pain : No/denies pain     BMI - recorded: 32.69 Nutritional Status: BMI > 30  Obese  Nutritional Risks: None Diabetes: No     Interpreter Needed?: No  Information entered by :: Cheryal Salas, RMA   Activities of Daily Living     07/27/2023   10:54 AM  In your present state of health, do you have any difficulty performing the following activities:  Hearing? 0  Vision? 0  Difficulty concentrating or making decisions? 0  Walking or climbing stairs? 0  Dressing or bathing? 0  Doing errands, shopping? 0  Preparing Food and eating ? N  Using the Toilet? N  In the past six months, have you accidently leaked urine? N  Do you have problems with loss of bowel control? N  Managing your Medications? N  Managing your Finances? N  Housekeeping or managing your Housekeeping? N    Patient Care Team: Plotnikov, Georgina Quint, MD as PCP - General (Internal Medicine) Ollen Gross, MD as Consulting Physician (Orthopedic Surgery) Antony Contras, MD as Consulting Physician (Ophthalmology)  Indicate any recent Medical Services you may have received from other than [facility] providers in the past year (date may be approximate).     Assessment:   This is a routine wellness examination for Clarence.  Hearing/Vision screen Hearing Screening - Comments:: Denies hearing difficulties   Vision Screening - Comments:: Wears eyeglasses   Goals Addressed   None    Depression Screen     07/27/2023   11:03 AM 02/10/2023    9:42 AM 05/26/2022    8:32 AM  04/30/2022    2:20 PM 02/17/2022    1:13 PM 01/08/2021   10:08 AM 06/26/2020    3:37 PM  PHQ 2/9 Scores  PHQ - 2 Score 0 0 0 0 0 0 0  PHQ- 9 Score 0  0 4   0    Fall Risk     07/27/2023   10:59 AM 02/10/2023    9:41 AM 05/26/2022    8:31 AM 04/30/2022    2:20 PM 02/17/2022    1:09 PM  Fall Risk   Falls in the past year? 0 0 0 1 0  Number falls in past yr: 0 0 0 0 0  Injury with Fall? 0 0 0 0 0  Risk for fall due to : No Fall Risks No Fall Risks No Fall Risks No Fall Risks No Fall Risks  Follow up Falls prevention discussed;Falls evaluation completed Falls evaluation completed Falls evaluation completed Falls evaluation completed Falls prevention discussed    MEDICARE RISK AT HOME:  Medicare Risk at Home Any stairs in or around the home?: Yes If so, are there any without handrails?: No Home free of loose throw rugs in walkways, pet beds, electrical cords, etc?: Yes Adequate lighting in your home to reduce risk of falls?: Yes Life alert?: No Use of a cane, walker or w/c?: No Grab bars in the bathroom?: Yes Shower chair or bench in shower?: Yes Elevated toilet seat or a handicapped toilet?: Yes  TIMED UP AND GO:  Was the test performed?  no   Cognitive Function: Normal: Normal cognitive status assessed by direct observation by this Clinical Health Advisor. No abnormalities found. Patient is able to answer questions in an accurate and timely manner.        07/27/2023   10:59 AM 02/17/2022    1:15 PM  6CIT Screen  What Year? 0 points 0 points  What month? 0 points 0 points  What time? 0 points 0 points  Count back from 20 0 points 0 points  Months  in reverse 0 points 0 points  Repeat phrase 0 points 0 points  Total Score 0 points 0 points    Immunizations Immunization History  Administered Date(s) Administered   Fluad Quad(high Dose 65+) 01/15/2019, 02/23/2020, 02/20/2022   Fluad Trivalent(High Dose 65+) 02/24/2023   Influenza Whole 02/14/2008, 02/13/2009, 02/03/2010    Influenza, High Dose Seasonal PF 01/28/2021   Influenza,inj,Quad PF,6+ Mos 03/23/2013, 03/10/2014, 02/27/2015, 01/22/2016, 02/18/2017, 01/26/2018   Influenza,inj,quad, With Preservative 02/10/2019   Influenza-Unspecified 03/13/2011   PFIZER(Purple Top)SARS-COV-2 Vaccination 07/26/2019, 08/17/2019, 02/23/2020   PNEUMOCOCCAL CONJUGATE-20 02/27/2022   Pfizer Covid-19 Vaccine Bivalent Booster 75yrs & up 01/28/2021   Tdap 07/21/2012   Zoster Recombinant(Shingrix) 06/17/2017, 09/21/2017    Screening Tests Health Maintenance  Topic Date Due   DTaP/Tdap/Td (2 - Td or Tdap) 07/22/2022   COVID-19 Vaccine (5 - 2024-25 season) 01/11/2023   Medicare Annual Wellness (AWV)  02/18/2023   Colonoscopy  01/15/2030   Pneumonia Vaccine 41+ Years old  Completed   INFLUENZA VACCINE  Completed   Hepatitis C Screening  Completed   Zoster Vaccines- Shingrix  Completed   HPV VACCINES  Aged Out    Health Maintenance  Health Maintenance Due  Topic Date Due   DTaP/Tdap/Td (2 - Td or Tdap) 07/22/2022   COVID-19 Vaccine (5 - 2024-25 season) 01/11/2023   Medicare Annual Wellness (AWV)  02/18/2023   Health Maintenance Items Addressed: See Nurse Notes  Additional Screening:  Vision Screening: Recommended annual ophthalmology exams for early detection of glaucoma and other disorders of the eye.  Dental Screening: Recommended annual dental exams for proper oral hygiene  Community Resource Referral / Chronic Care Management: CRR required this visit?  No   CCM required this visit?  No     Plan:     I have personally reviewed and noted the following in the patient's chart:   Medical and social history Use of alcohol, tobacco or illicit drugs  Current medications and supplements including opioid prescriptions. Patient is not currently taking opioid prescriptions. Functional ability and status Nutritional status Physical activity Advanced directives List of other physicians Hospitalizations,  surgeries, and ER visits in previous 12 months Vitals Screenings to include cognitive, depression, and falls Referrals and appointments  In addition, I have reviewed and discussed with patient certain preventive protocols, quality metrics, and best practice recommendations. A written personalized care plan for preventive services as well as general preventive health recommendations were provided to patient.     Delani Kohli L Oaklyn Jakubek, CMA   07/27/2023   After Visit Summary: (MyChart) Due to this being a telephonic visit, the after visit summary with patients personalized plan was offered to patient via MyChart   Notes: Please refer to Routing Comments.  Medical screening examination/treatment/procedure(s) were performed by non-physician practitioner and as supervising physician I was immediately available for consultation/collaboration.  I agree with above. Jacinta Shoe, MD

## 2023-07-27 NOTE — Patient Instructions (Signed)
 Jay Holmes , Thank you for taking time to come for your Medicare Wellness Visit. I appreciate your ongoing commitment to your health goals. Please review the following plan we discussed and let me know if I can assist you in the future.   Referrals/Orders/Follow-Ups/Clinician Recommendations: It was nice talking with you today.  Keep up the good work.  Remember to inquire about Advance directive packet in office, during your next visit.  This is a list of the screening recommended for you and due dates:  Health Maintenance  Topic Date Due   DTaP/Tdap/Td vaccine (2 - Td or Tdap) 07/22/2022   COVID-19 Vaccine (5 - 2024-25 season) 01/11/2023   Medicare Annual Wellness Visit  07/26/2024   Colon Cancer Screening  01/15/2030   Pneumonia Vaccine  Completed   Flu Shot  Completed   Hepatitis C Screening  Completed   Zoster (Shingles) Vaccine  Completed   HPV Vaccine  Aged Out    Advanced directives: (Copy Requested) Please bring a copy of your health care power of attorney and living will to the office to be added to your chart at your convenience. You can mail to [facility] [address] [place], [place] [address] or email to [email or web address]  Next Medicare Annual Wellness Visit scheduled for next year: Yes

## 2023-08-11 ENCOUNTER — Ambulatory Visit (INDEPENDENT_AMBULATORY_CARE_PROVIDER_SITE_OTHER): Payer: Medicare Other | Admitting: Internal Medicine

## 2023-08-11 ENCOUNTER — Encounter: Payer: Self-pay | Admitting: Internal Medicine

## 2023-08-11 VITALS — BP 116/82 | HR 90 | Temp 97.7°F | Ht 68.0 in | Wt 213.4 lb

## 2023-08-11 DIAGNOSIS — E669 Obesity, unspecified: Secondary | ICD-10-CM | POA: Diagnosis not present

## 2023-08-11 DIAGNOSIS — R739 Hyperglycemia, unspecified: Secondary | ICD-10-CM | POA: Diagnosis not present

## 2023-08-11 DIAGNOSIS — I2583 Coronary atherosclerosis due to lipid rich plaque: Secondary | ICD-10-CM | POA: Diagnosis not present

## 2023-08-11 DIAGNOSIS — K219 Gastro-esophageal reflux disease without esophagitis: Secondary | ICD-10-CM

## 2023-08-11 DIAGNOSIS — R748 Abnormal levels of other serum enzymes: Secondary | ICD-10-CM

## 2023-08-11 DIAGNOSIS — E039 Hypothyroidism, unspecified: Secondary | ICD-10-CM

## 2023-08-11 DIAGNOSIS — Z6832 Body mass index (BMI) 32.0-32.9, adult: Secondary | ICD-10-CM | POA: Diagnosis not present

## 2023-08-11 DIAGNOSIS — I2510 Atherosclerotic heart disease of native coronary artery without angina pectoris: Secondary | ICD-10-CM

## 2023-08-11 LAB — COMPREHENSIVE METABOLIC PANEL WITH GFR
ALT: 23 U/L (ref 0–53)
AST: 20 U/L (ref 0–37)
Albumin: 4.1 g/dL (ref 3.5–5.2)
Alkaline Phosphatase: 119 U/L — ABNORMAL HIGH (ref 39–117)
BUN: 20 mg/dL (ref 6–23)
CO2: 26 meq/L (ref 19–32)
Calcium: 9.1 mg/dL (ref 8.4–10.5)
Chloride: 106 meq/L (ref 96–112)
Creatinine, Ser: 1.12 mg/dL (ref 0.40–1.50)
GFR: 67.29 mL/min
Glucose, Bld: 164 mg/dL — ABNORMAL HIGH (ref 70–99)
Potassium: 4.7 meq/L (ref 3.5–5.1)
Sodium: 140 meq/L (ref 135–145)
Total Bilirubin: 0.5 mg/dL (ref 0.2–1.2)
Total Protein: 7 g/dL (ref 6.0–8.3)

## 2023-08-11 LAB — HEMOGLOBIN A1C: Hgb A1c MFr Bld: 6.1 % (ref 4.6–6.5)

## 2023-08-11 LAB — TSH: TSH: 1.95 u[IU]/mL (ref 0.35–5.50)

## 2023-08-11 MED ORDER — ASPIRIN 81 MG PO TBEC
81.0000 mg | DELAYED_RELEASE_TABLET | Freq: Every day | ORAL | Status: DC
Start: 2023-08-11 — End: 2024-02-15

## 2023-08-11 MED ORDER — MEGARED OMEGA-3 KRILL OIL 500 MG PO CAPS: 1.0000 | ORAL_CAPSULE | Freq: Every morning | ORAL | Status: DC

## 2023-08-11 NOTE — Assessment & Plan Note (Addendum)
 Cont on Levothyroxine Check TSH

## 2023-08-11 NOTE — Progress Notes (Signed)
 Subjective:  Patient ID: Jay Holmes, male    DOB: 1955/11/15  Age: 68 y.o. MRN: [id number]  CC: Medical Management of Chronic Issues   HPI [name] presents for hypothyroidism, GERD, depression Mood is much better  Outpatient Medications Prior to Visit  Medication Sig Dispense Refill   Bimatoprost (LUMIGAN OP) Apply 2.5 mLs to eye.     Brimonidine Tartrate-Timolol (COMBIGAN OP) Apply 5 mLs to eye.     doxycycline (MONODOX) 100 MG capsule Take 100 mg by mouth daily.     Glucosamine-Chondroit-Vit C-Mn (GLUCOSAMINE CHONDR 1500 COMPLX PO) Take by mouth. Chondroitin 1200mg  2 tablets daily     ketoconazole (NIZORAL) 2 % cream Apply 1 application  topically daily as needed (rosacea).     levothyroxine (SYNTHROID) 100 MCG tablet TAKE 1 TABLET BY MOUTH DAILY 90 tablet 3   omeprazole (PRILOSEC) 40 MG capsule TAKE 1 CAPSULE BY MOUTH DAILY 90 capsule 3   Vilazodone HCl 20 MG TABS TAKE 1 TABLET BY MOUTH DAILY 90 tablet 1   VITAMIN D, CHOLECALCIFEROL, PO Take by mouth. OTC     Vitamin D, Ergocalciferol, (DRISDOL) 1.25 MG (50000 UNIT) CAPS capsule Take 1 capsule (50,000 Units total) by mouth every 7 (seven) days. 4 capsule 0   No facility-administered medications prior to visit.    ROS: Review of Systems  Constitutional:  Negative for appetite change, fatigue and unexpected weight change.  HENT:  Negative for congestion, nosebleeds, sneezing, sore throat and trouble swallowing.   Eyes:  Negative for itching and visual disturbance.  Respiratory:  Negative for cough.   Cardiovascular:  Negative for chest pain, palpitations and leg swelling.  Gastrointestinal:  Negative for abdominal distention, blood in stool, diarrhea and nausea.  Genitourinary:  Negative for frequency and hematuria.  Musculoskeletal:  Positive for arthralgias. Negative for back pain, gait problem, joint swelling and neck pain.  Skin:  Negative for rash.  Neurological:  Negative for dizziness, tremors, speech  difficulty and weakness.  Psychiatric/Behavioral:  Negative for agitation, dysphoric mood and sleep disturbance. The patient is not nervous/anxious.     Objective:  BP 116/82 (BP Location: Left Arm, Patient Position: Sitting)   Pulse 90   Temp 97.7 F (36.5 C) (Temporal)   Ht 5\' 8"  (1.727 m)   Wt 213 lb 6.4 oz (96.8 kg)   SpO2 96%   BMI 32.45 kg/m   BP Readings from Last 3 Encounters:  08/11/23 116/82  05/25/23 99/64  04/08/23 114/79    Wt Readings from Last 3 Encounters:  08/11/23 213 lb 6.4 oz (96.8 kg)  07/27/23 215 lb (97.5 kg)  05/25/23 207 lb (93.9 kg)    Physical Exam Constitutional:      General: He is not in acute distress.    Appearance: He is well-developed. He is obese.     Comments: NAD  Eyes:     Conjunctiva/sclera: Conjunctivae normal.     Pupils: Pupils are equal, round, and reactive to light.  Neck:     Thyroid: No thyromegaly.     Vascular: No JVD.  Cardiovascular:     Rate and Rhythm: Normal rate and regular rhythm.     Heart sounds: Normal heart sounds. No murmur heard.    No friction rub. No gallop.  Pulmonary:     Effort: Pulmonary effort is normal. No respiratory distress.     Breath sounds: Normal breath sounds. No wheezing or rales.  Chest:     Chest wall: No tenderness.  Abdominal:  General: Bowel sounds are normal. There is no distension.     Palpations: Abdomen is soft. There is no mass.     Tenderness: There is no abdominal tenderness. There is no guarding or rebound.  Musculoskeletal:        General: No tenderness. Normal range of motion.     Cervical back: Normal range of motion.  Lymphadenopathy:     Cervical: No cervical adenopathy.  Skin:    General: Skin is warm and dry.     Findings: No rash.  Neurological:     Mental Status: He is alert and oriented to person, place, and time.     Cranial Nerves: No cranial nerve deficit.     Motor: No abnormal muscle tone.     Coordination: Coordination normal.     Gait: Gait  normal.     Deep Tendon Reflexes: Reflexes are normal and symmetric.  Psychiatric:        Behavior: Behavior normal.        Thought Content: Thought content normal.        Judgment: Judgment normal.     Lab Results  Component Value Date   WBC 5.4 11/26/2022   HGB 16.0 11/26/2022   HCT 48.6 11/26/2022   PLT 193 11/26/2022   GLUCOSE 99 11/26/2022   CHOL 156 11/26/2022   TRIG 117 11/26/2022   HDL 41 11/26/2022   LDLDIRECT 74.0 02/07/2021   LDLCALC 94 11/26/2022   ALT 39 11/26/2022   AST 28 11/26/2022   NA 142 11/26/2022   K 4.3 11/26/2022   CL 102 11/26/2022   CREATININE 0.93 11/26/2022   BUN 19 11/26/2022   CO2 24 11/26/2022   TSH 0.690 11/26/2022   PSA 0.31 02/10/2023   INR 0.94 04/28/2018   HGBA1C 6.3 (H) 11/26/2022    VAS US CAROTID Result Date: 05/08/2022 Carotid Arterial Duplex Study Patient Name:  Jay Holmes  Date of Exam:   05/08/2022 Medical Rec #: [id number]       Accession #:    [id number] Date of Birth: 02/03/1956       Patient Gender: M Patient Age:   61 years Exam Location:  [facility] Procedure:      VAS US CAROTID Referring Phys: Larene Beach HENSON --------------------------------------------------------------------------------  Indications:       Dizziness per order. Patient reports acute onset of "flush"                    feeling accompanied with cold sweat all over his body, with                    cold sweat particularly on his head lasting for at least 30                    seconds to 1 minute. During that time he was panicked and                    cannot recall if he was experiencing any other symptoms. Risk Factors:      No history of smoking, coronary artery disease. Comparison Study:  NA Performing Technologist: Tyna Jaksch RVT  Examination Guidelines: A complete evaluation includes B-mode imaging, spectral Doppler, color Doppler, and power Doppler as needed of all accessible portions of each vessel. Bilateral testing is considered an integral part of a  complete examination. Limited examinations for reoccurring indications may be performed as noted.  Right Carotid Findings: +----------+--------+--------+--------+------------------+--------+  PSV cm/sEDV cm/sStenosisPlaque DescriptionComments +----------+--------+--------+--------+------------------+--------+ CCA Prox  85      19                                         +----------+--------+--------+--------+------------------+--------+ CCA Distal72      19                                         +----------+--------+--------+--------+------------------+--------+ ICA Prox  60      13      1-39%   heterogenous               +----------+--------+--------+--------+------------------+--------+ ICA Mid   67      27                                         +----------+--------+--------+--------+------------------+--------+ ICA Distal66      24                                         +----------+--------+--------+--------+------------------+--------+ ECA       101     9                                          +----------+--------+--------+--------+------------------+--------+ +----------+--------+-------+----------------+-------------------+           PSV cm/sEDV cmsDescribe        Arm Pressure (mmHG) +----------+--------+-------+----------------+-------------------+ [id number]            Multiphasic, [id number]                 +----------+--------+-------+----------------+-------------------+ +---------+--------+--+--------+--+---------+ VertebralPSV cm/s47EDV cm/s12Antegrade +---------+--------+--+--------+--+---------+  Left Carotid Findings: +----------+--------+--------+--------+----------------------+--------+           PSV cm/sEDV cm/sStenosisPlaque Description    Comments +----------+--------+--------+--------+----------------------+--------+ CCA Prox  96      22                                              +----------+--------+--------+--------+----------------------+--------+ CCA Distal74      19                                             +----------+--------+--------+--------+----------------------+--------+ ICA Prox  93      17      1-39%   heterogenous and focal         +----------+--------+--------+--------+----------------------+--------+ ICA Mid   68      25                                    tortuous +----------+--------+--------+--------+----------------------+--------+ ICA Distal65      27  tortuous +----------+--------+--------+--------+----------------------+--------+ ECA       106     11                                             +----------+--------+--------+--------+----------------------+--------+ +----------+--------+--------+----------------+-------------------+           PSV cm/sEDV cm/sDescribe        Arm Pressure (mmHG) +----------+--------+--------+----------------+-------------------+ [id number]             Multiphasic, [id number]                 +----------+--------+--------+----------------+-------------------+ +---------+--------+--+--------+--+---------+ VertebralPSV cm/s35EDV cm/s12Antegrade +---------+--------+--+--------+--+---------+   Summary: Right Carotid: Velocities in the right ICA are consistent with a 1-39% stenosis. Left Carotid: Velocities in the left ICA are consistent with a 1-39% stenosis. Vertebrals:  Bilateral vertebral arteries demonstrate antegrade flow. Subclavians: Normal flow hemodynamics were seen in bilateral subclavian              arteries. *See table(s) above for measurements and observations.  Electronically signed by Dina Rich MD on 05/08/2022 at 7:55:26 PM.    Final     Assessment & Plan:   Problem List Items Addressed This Visit     Hypothyroidism   Cont on Levothyroxine Check TSH      Relevant Orders   TSH   Obesity (BMI 30-39.9)   Wt Readings from  Last 3 Encounters:  08/11/23 213 lb 6.4 oz (96.8 kg)  07/27/23 215 lb (97.5 kg)  05/25/23 207 lb (93.9 kg)  F/u w/[facility] wt management program       GERD   Cont w/ Omeprazole      Hyperglycemia - Primary   Wt loss advised Pt lost wt - no sodas      Relevant Orders   Comprehensive metabolic panel with GFR   Hemoglobin A1c   TSH   CAD (coronary artery disease)   On Krill oil      Relevant Orders   Comprehensive metabolic panel with GFR   Hemoglobin A1c   TSH      No orders of the defined types were placed in this encounter.     Follow-up: Return in about 6 months (around 02/10/2024) for Wellness Exam.  Sonda Primes, MD

## 2023-08-11 NOTE — Assessment & Plan Note (Signed)
 Wt Readings from Last 3 Encounters:  08/11/23 213 lb 6.4 oz (96.8 kg)  07/27/23 215 lb (97.5 kg)  05/25/23 207 lb (93.9 kg)  F/u w/[facility] wt management program

## 2023-08-11 NOTE — Assessment & Plan Note (Addendum)
 Re-start Krill oil, ASA

## 2023-08-11 NOTE — Addendum Note (Signed)
 Addended by: Tresa Garter on: 08/11/2023 09:50 PM   Modules accepted: Orders

## 2023-08-11 NOTE — Assessment & Plan Note (Signed)
Cont w/ Omeprazole °

## 2023-08-11 NOTE — Assessment & Plan Note (Signed)
 Wt loss advised Pt lost wt - no sodas

## 2023-08-12 ENCOUNTER — Other Ambulatory Visit: Payer: Self-pay | Admitting: Internal Medicine

## 2023-08-13 ENCOUNTER — Ambulatory Visit
Admission: RE | Admit: 2023-08-13 | Discharge: 2023-08-13 | Discharge status: Home / Self Care | Source: Ambulatory Visit | Attending: Internal Medicine | Admitting: Internal Medicine

## 2023-08-13 DIAGNOSIS — R748 Abnormal levels of other serum enzymes: Secondary | ICD-10-CM

## 2023-08-17 ENCOUNTER — Encounter: Payer: Self-pay | Admitting: Internal Medicine

## 2023-08-19 ENCOUNTER — Other Ambulatory Visit

## 2023-08-29 ENCOUNTER — Other Ambulatory Visit: Payer: Self-pay | Admitting: Internal Medicine

## 2023-08-29 DIAGNOSIS — K219 Gastro-esophageal reflux disease without esophagitis: Secondary | ICD-10-CM

## 2023-10-15 ENCOUNTER — Ambulatory Visit (INDEPENDENT_AMBULATORY_CARE_PROVIDER_SITE_OTHER): Admitting: Adult Health

## 2023-10-15 ENCOUNTER — Encounter (INDEPENDENT_AMBULATORY_CARE_PROVIDER_SITE_OTHER): Payer: Self-pay | Admitting: Adult Health

## 2023-10-15 VITALS — BP 110/67 | HR 81 | Temp 98.8°F | Ht 68.0 in | Wt 216.0 lb

## 2023-10-15 DIAGNOSIS — E669 Obesity, unspecified: Secondary | ICD-10-CM

## 2023-10-15 DIAGNOSIS — Z6832 Body mass index (BMI) 32.0-32.9, adult: Secondary | ICD-10-CM

## 2023-10-15 DIAGNOSIS — E66811 Obesity, class 1: Secondary | ICD-10-CM

## 2023-10-15 DIAGNOSIS — E559 Vitamin D deficiency, unspecified: Secondary | ICD-10-CM | POA: Diagnosis not present

## 2023-10-15 DIAGNOSIS — R7303 Prediabetes: Secondary | ICD-10-CM | POA: Diagnosis not present

## 2023-10-15 DIAGNOSIS — R748 Abnormal levels of other serum enzymes: Secondary | ICD-10-CM

## 2023-10-15 MED ORDER — METFORMIN HCL 500 MG PO TABS: ORAL_TABLET | ORAL | 1 refills | Status: DC

## 2023-10-15 MED ORDER — METFORMIN HCL 500 MG PO TABS: ORAL_TABLET | ORAL | 0 refills | Status: DC

## 2023-10-15 NOTE — Progress Notes (Signed)
 WEIGHT SUMMARY AND BIOMETRICS  Vitals Temp: 98.8 F (37.1 C) BP: 110/67 Pulse Rate: 81 SpO2: 99 %   Anthropometric Measurements Height: 5\' 8"  (1.727 m) Weight: 216 lb (98 kg) BMI (Calculated): 32.85 Weight at Last Visit: 207 lb Weight Lost Since Last Visit: 0 Weight Gained Since Last Visit: 9 lb Total Weight Loss (lbs): 15 lb (6.804 kg)   Body Composition  Body Fat %: 32.1 % Fat Mass (lbs): 69.4 lbs Muscle Mass (lbs): 139.4 lbs Total Body Water  (lbs): 104.2 lbs Visceral Fat Rating : 19   Other Clinical Data Fasting: no Labs: no Today's Visit #: 6 Starting Date: 11/26/22    Chief Complaint:   OBESITY Jay Holmes is here to discuss his progress with his obesity treatment plan.  He is on the the Category 2 Plan and states he is following his eating plan approximately 0 % of the time.  He states he is exercising Gym/Walking 40/30 minutes 5 times per week.  Interim History:  Last OV at [facility] 05/25/2023- recommended to f/u in 4 weeks Lost to follow-up  He had OV with PCP and labs completed on 08/11/2023  09/07/2023 OV with [facility]-  Acute atraumatic left knee pain ?? Suspect pes anserine tendinitis/bursitis   Plan:  ?? Rest ice and elevation encouraged  ?? Start meloxicam  7.5 mg daily with food  ?? OTC topical diclofenac  gel discussed  ?? OTC Tylenol  500 mg 3 times daily   Reviewed Bioimpedance results with pt: Muscle Mass: +4.6 lbs Adipose Mass: +4.2 lbs  Of Note- Wife, Jay Holmes, as BS during OV  Subjective:   1. Elevated serum alkaline phosphatase level  Latest Reference Range & Units 08/11/23 10:12  Alkaline Phosphatase 39 - 117 U/L 119 (H)  (H): Data is abnormally high  08/13/2023 CLINICAL DATA:  Elevated alkaline phosphatase.   EXAM: ULTRASOUND ABDOMEN LIMITED RIGHT UPPER QUADRANT   COMPARISON:  August 16, 2009   FINDINGS: Gallbladder:   No gallstones or wall thickening visualized. No sonographic Murphy sign noted  by sonographer.   Common bile duct:   Not well seen per ultrasound technologist.   Liver:   No focal lesion. Increased echotexture. Portal vein is patent on color Doppler imaging with normal direction of blood flow towards the liver.   Other: None.   IMPRESSION: 1. No acute abnormality identified. 2. Increased echotexture of the liver. This is a nonspecific finding but can be seen in fatty infiltration of liver.  2. Prediabetes Lab Results  Component Value Date   HGBA1C 6.1 08/11/2023   HGBA1C 6.3 (H) 11/26/2022   HGBA1C 6.3 02/20/2022     Latest Reference Range & Units 02/07/21 14:14 05/20/21 11:21 02/20/22 09:22 04/30/22 15:06 11/26/22 14:27 08/11/23 10:12  Glucose 70 - 99 mg/dL 536 (H) 644 (H) 034 (H) 110 (H) 99 164 (H)  (H): Data is abnormally high   Latest Reference Range & Units 08/11/23 10:12  GFR >60.00 mL/min 67.29   He endorses nighttime polyphagia and subsequent snacking He has never been on Metformin therapy- discussed at length the risks/benefits of therapy Metformin Information  handout provided to pt He is agreeable to starting  3. Vitamin D  deficiency  Latest Reference Range & Units 11/26/22 14:27  Vitamin D , 25-Hydroxy 30.0 - 100.0 ng/mL 30.3   Vit D level low normal  Assessment/Plan:   1. Elevated serum alkaline phosphatase level Continue with weight loss efforts Restart MP and continue regular exercise  2. Prediabetes Restart MP and continue regular  exercise Start  metFORMIN (GLUCOPHAGE) 500 MG tablet 1/2 tab daily with dinner for one week, then increase to one full tab with dinner daily- hold at this dose Dispense: 30 tablet, Refills: 1 ordered   3. Vitamin D  deficiency (Primary) Discuss supplementation at next OV  4. BMI 32.0-32.9,adult - current BMI 32.8  Chung is not currently in the action stage of change. As such, his goal is to get back to weightloss efforts . He has agreed to the Category 2 Plan and keeping a food journal and  adhering to recommended goals of 1200 calories and 80g+ protein.   Try to replaced full soda with diet diet (Diet MD, Diet DP)  Exercise goals: Older adults should follow the adult guidelines. When older adults cannot meet the adult guidelines, they should be as physically active as their abilities and conditions will allow.  Older adults should do exercises that maintain or improve balance if they are at risk of falling.  Older adults should determine their level of effort for physical activity relative to their level of fitness.  Older adults with chronic conditions should understand whether and how their conditions affect their ability to do regular physical activity safely.  Behavioral modification strategies: increasing lean protein intake, decreasing simple carbohydrates, increasing vegetables, increasing water  intake, no skipping meals, meal planning and cooking strategies, keeping healthy foods in the home, ways to avoid boredom eating, ways to avoid night time snacking, better snacking choices, emotional eating strategies, celebration eating strategies, planning for success, and decreasing junk food.  [name] has agreed to follow-up with our clinic in 4 weeks. He was informed of the importance of frequent follow-up visits to maximize his success with intensive lifestyle modifications for his multiple health conditions.   Objective:   Blood pressure 110/67, pulse 81, temperature 98.8 F (37.1 C), height 5\' 8"  (1.727 m), weight 216 lb (98 kg), SpO2 99%. Body mass index is 32.84 kg/m.  General: Cooperative, alert, well developed, in no acute distress. HEENT: Conjunctivae and lids unremarkable. Cardiovascular: Regular rhythm.  Lungs: Normal work of breathing. Neurologic: No focal deficits.   Lab Results  Component Value Date   CREATININE 1.12 08/11/2023   BUN 20 08/11/2023   NA 140 08/11/2023   K 4.7 08/11/2023   CL 106 08/11/2023   CO2 26 08/11/2023   Lab Results  Component  Value Date   ALT 23 08/11/2023   AST 20 08/11/2023   ALKPHOS 119 (H) 08/11/2023   BILITOT 0.5 08/11/2023   Lab Results  Component Value Date   HGBA1C 6.1 08/11/2023   HGBA1C 6.3 (H) 11/26/2022   HGBA1C 6.3 02/20/2022   HGBA1C 6.2 02/07/2021   HGBA1C 6.1 06/26/2020   Lab Results  Component Value Date   INSULIN  25.6 (H) 11/26/2022   Lab Results  Component Value Date   TSH 1.95 08/11/2023   Lab Results  Component Value Date   CHOL 156 11/26/2022   HDL 41 11/26/2022   LDLCALC 94 11/26/2022   LDLDIRECT 74.0 02/07/2021   TRIG 117 11/26/2022   CHOLHDL 3 05/20/2021   Lab Results  Component Value Date   VD25OH 30.3 11/26/2022   VD25OH 40.27 10/12/2019   Lab Results  Component Value Date   WBC 5.4 11/26/2022   HGB 16.0 11/26/2022   HCT 48.6 11/26/2022   MCV 97 11/26/2022   PLT 193 11/26/2022   No results found for: "IRON", "TIBC", "FERRITIN"  Attestation Statements:   Reviewed by clinician on day of visit: allergies, medications,  problem list, medical history, surgical history, family history, social history, and previous encounter notes.  I have reviewed the above documentation for accuracy and completeness, and I agree with the above. -  Josearmando Kuhnert d. Calhoun Reichardt, NP-C

## 2023-11-25 ENCOUNTER — Encounter (INDEPENDENT_AMBULATORY_CARE_PROVIDER_SITE_OTHER): Payer: Self-pay | Admitting: Adult Health

## 2023-11-25 ENCOUNTER — Ambulatory Visit (INDEPENDENT_AMBULATORY_CARE_PROVIDER_SITE_OTHER): Admitting: Adult Health

## 2023-11-25 VITALS — BP 99/65 | HR 82 | Temp 98.5°F | Ht 68.0 in | Wt 212.0 lb

## 2023-11-25 DIAGNOSIS — R7303 Prediabetes: Secondary | ICD-10-CM

## 2023-11-25 DIAGNOSIS — R748 Abnormal levels of other serum enzymes: Secondary | ICD-10-CM

## 2023-11-25 DIAGNOSIS — E66811 Obesity, class 1: Secondary | ICD-10-CM

## 2023-11-25 DIAGNOSIS — E669 Obesity, unspecified: Secondary | ICD-10-CM | POA: Diagnosis not present

## 2023-11-25 DIAGNOSIS — E559 Vitamin D deficiency, unspecified: Secondary | ICD-10-CM | POA: Diagnosis not present

## 2023-11-25 DIAGNOSIS — Z6832 Body mass index (BMI) 32.0-32.9, adult: Secondary | ICD-10-CM

## 2023-11-25 MED ORDER — METFORMIN HCL 500 MG PO TABS: ORAL_TABLET | ORAL | 0 refills | Status: DC

## 2023-11-25 NOTE — Progress Notes (Signed)
 WEIGHT SUMMARY AND BIOMETRICS  Vitals Temp: 98.5 F (36.9 C) BP: 99/65 Pulse Rate: 82 SpO2: 96 %   Anthropometric Measurements Height: 5' 8 (1.727 m) Weight: 212 lb (96.2 kg) BMI (Calculated): 32.24 Weight at Last Visit: 216 lb Weight Lost Since Last Visit: 4 lb Weight Gained Since Last Visit: 0 Starting Weight: 213 lb Total Weight Loss (lbs): 1 lb (0.454 kg)   Body Composition  Body Fat %: 31.2 % Fat Mass (lbs): 66.4 lbs Muscle Mass (lbs): 139.2 lbs Total Body Water  (lbs): 104.4 lbs Visceral Fat Rating : 19   Other Clinical Data Fasting: no Labs: no Today's Visit #: 7 Starting Date: 11/26/22    Chief Complaint:   OBESITY Jay Holmes is here to discuss his progress with his obesity treatment plan.  He is on the the Category 2 Plan and practicing portion control and making smarter food choices, such as increasing vegetables and decreasing simple carbohydrates and states he is following his eating plan approximately 75 % of the time.  He states he is exercising Gym 45-50 minutes 4-5 times per week.  Interim History:  Last OV at [facility] 05/25/2023- recommended to f/u in 4 weeks Re- established with [facility] on 10/15/2023 He was started Metformin  500mg , he has titrated up to full tablet with dinner. When he attends [employer], he has been ordering only a hot dog and avoiding nachos.  Reviewed Bioimpedance Results with pt: Muscle Mass: -0.2 lb Adipose Mass: -3 lbs  Of Note- Jay Holmes attended OV via speaker phone provided by Jay Holmes. All questions/concerns answered.  Subjective:   1. Prediabetes  Latest Reference Range & Units 08/11/23 10:12  GFR >60.00 mL/min 67.29   Lab Results  Component Value Date   HGBA1C 6.1 08/11/2023   HGBA1C 6.3 (H) 11/26/2022   HGBA1C 6.3 02/20/2022    10/15/2023 Started on Metformin  500mg . He has titrated up to full tablet with dinner. He denies GI upset with use.  2. Vitamin D  deficiency  Latest Reference Range &  Units 11/26/22 14:27  Vitamin D , 25-Hydroxy 30.0 - 100.0 ng/mL 30.3   He is not on any Vit D supplementation  3. Elevated serum alkaline phosphatase level 08/13/2023 Narrative & Impression  CLINICAL DATA:  Elevated alkaline phosphatase.   EXAM: ULTRASOUND ABDOMEN LIMITED RIGHT UPPER QUADRANT   COMPARISON:  August 16, 2009   FINDINGS: Gallbladder:   No gallstones or wall thickening visualized. No sonographic Murphy sign noted by sonographer.   Common bile duct:   Not well seen per ultrasound technologist.   Liver:   No focal lesion. Increased echotexture. Portal vein is patent on color Doppler imaging with normal direction of blood flow towards the liver.   Other: None.   IMPRESSION: 1. No acute abnormality identified. 2. Increased echotexture of the liver. This is a nonspecific finding but can be seen in fatty infiltration of liver.   Assessment/Plan:   1. Prediabetes Refill metFORMIN  (GLUCOPHAGE ) 500 MG tablet 1 tab with dinner Dispense: 30 tablet, Refills: 0 ordered   2. Vitamin D  deficiency Check Labs at next OV  3. Elevated serum alkaline phosphatase level (Primary) Continue with weight loss efforts  4. BMI 32.0-32.9,adult - current BMI 32.4  Jay Holmes is currently in the action stage of change. As such, his goal is to continue with weight loss efforts. He has agreed to the Category 2 Plan and practicing portion control and making smarter food choices, such as increasing vegetables and decreasing simple carbohydrates.  Exercise goals: Older adults should follow the adult guidelines. When older adults cannot meet the adult guidelines, they should be as physically active as their abilities and conditions will allow.  Older adults should do exercises that maintain or improve balance if they are at risk of falling.  Older adults should determine their level of effort for physical activity relative to their level of fitness.  Older adults with chronic conditions should  understand whether and how their conditions affect their ability to do regular physical activity safely.  Behavioral modification strategies: increasing lean protein intake, decreasing simple carbohydrates, increasing vegetables, increasing water  intake, decreasing eating out, no skipping meals, meal planning and cooking strategies, keeping healthy foods in the home, ways to avoid boredom eating, ways to avoid night time snacking, better snacking choices, planning for success, and decreasing junk food.  Jay Holmes has agreed to follow-up with our clinic in 4 weeks. He was informed of the importance of frequent follow-up visits to maximize his success with intensive lifestyle modifications for his multiple health conditions.   Check Fasting Labs at next OV  Objective:   Blood pressure 99/65, pulse 82, temperature 98.5 F (36.9 C), height 5' 8 (1.727 m), weight 212 lb (96.2 kg), SpO2 96%. Body mass index is 32.23 kg/m.  General: Cooperative, alert, well developed, in no acute distress. HEENT: Conjunctivae and lids unremarkable. Cardiovascular: Regular rhythm.  Lungs: Normal work of breathing. Neurologic: No focal deficits.   Lab Results  Component Value Date   CREATININE 1.12 08/11/2023   BUN 20 08/11/2023   NA 140 08/11/2023   K 4.7 08/11/2023   CL 106 08/11/2023   CO2 26 08/11/2023   Lab Results  Component Value Date   ALT 23 08/11/2023   AST 20 08/11/2023   ALKPHOS 119 (H) 08/11/2023   BILITOT 0.5 08/11/2023   Lab Results  Component Value Date   HGBA1C 6.1 08/11/2023   HGBA1C 6.3 (H) 11/26/2022   HGBA1C 6.3 02/20/2022   HGBA1C 6.2 02/07/2021   HGBA1C 6.1 06/26/2020   Lab Results  Component Value Date   INSULIN  25.6 (H) 11/26/2022   Lab Results  Component Value Date   TSH 1.95 08/11/2023   Lab Results  Component Value Date   CHOL 156 11/26/2022   HDL 41 11/26/2022   LDLCALC 94 11/26/2022   LDLDIRECT 74.0 02/07/2021   TRIG 117 11/26/2022   CHOLHDL 3 05/20/2021    Lab Results  Component Value Date   VD25OH 30.3 11/26/2022   VD25OH 40.27 10/12/2019   Lab Results  Component Value Date   WBC 5.4 11/26/2022   HGB 16.0 11/26/2022   HCT 48.6 11/26/2022   MCV 97 11/26/2022   PLT 193 11/26/2022   No results found for: IRON, TIBC, FERRITIN  Attestation Statements:   Reviewed by clinician on day of visit: allergies, medications, problem list, medical history, surgical history, family history, social history, and previous encounter notes.  I have reviewed the above documentation for accuracy and completeness, and I agree with the above. -  Lokelani Lutes d. Vedha Tercero, NP-C

## 2023-12-23 ENCOUNTER — Ambulatory Visit (INDEPENDENT_AMBULATORY_CARE_PROVIDER_SITE_OTHER): Admitting: Adult Health

## 2024-01-01 ENCOUNTER — Encounter: Payer: Self-pay | Admitting: Internal Medicine

## 2024-01-01 DIAGNOSIS — R0683 Snoring: Secondary | ICD-10-CM

## 2024-01-01 NOTE — Telephone Encounter (Signed)
 Copied from CRM #[id number]. Topic: Referral - Request for Referral >> Jan 01, 2024 11:35 AM [place] A wrote: Did the patient discuss referral with their provider in the last year? Yes (If No - schedule appointment) (If Yes - send message)  Appointment offered? Yes  Type of order/referral and detailed reason for visit: Sleep Apnea  Preference of office, provider, location: None  If referral order, have you been seen by this specialty before? No (If Yes, this issue or another issue? When? Where?  Can we respond through MyChart? Yes

## 2024-01-04 ENCOUNTER — Encounter (INDEPENDENT_AMBULATORY_CARE_PROVIDER_SITE_OTHER): Payer: Self-pay

## 2024-01-04 ENCOUNTER — Ambulatory Visit (INDEPENDENT_AMBULATORY_CARE_PROVIDER_SITE_OTHER): Admitting: Adult Health

## 2024-01-06 ENCOUNTER — Encounter (HOSPITAL_BASED_OUTPATIENT_CLINIC_OR_DEPARTMENT_OTHER): Payer: Self-pay

## 2024-01-06 ENCOUNTER — Ambulatory Visit (INDEPENDENT_AMBULATORY_CARE_PROVIDER_SITE_OTHER)

## 2024-01-06 VITALS — BP 113/75 | HR 77 | Ht 68.0 in | Wt 221.3 lb

## 2024-01-06 DIAGNOSIS — R400 Somnolence: Secondary | ICD-10-CM | POA: Diagnosis not present

## 2024-01-06 DIAGNOSIS — R0683 Snoring: Secondary | ICD-10-CM | POA: Diagnosis not present

## 2024-01-06 NOTE — Progress Notes (Signed)
 Epworth Sleepiness Scale  Use the following scale to choose the most appropriate number for each situation. 0 Would never nod off 1  Slight  chance of nodding off 2 Moderate chance of nodding off 3 High chance of nodding off  Sitting and reading: 1 Watching TV: 2 Sitting, inactive, in a public place (e.g., in a meeting, theater, or dinner event): 1 As a passenger in a car for an hour or more without stopping for a break: 0 Lying down to rest when circumstances permit:2 Sitting and talking to someone: 0 Sitting quietly after a meal without alcohol: 1 In a car, while stopped for a few  minutes in traffic or at a light: 0  TOTOAL: 7

## 2024-01-06 NOTE — Patient Instructions (Signed)
 Complete home sleep test as ordered.  Continue sleep hygiene as discussed.  Follow up ~6 weeks after sleep study completed.

## 2024-01-06 NOTE — Progress Notes (Signed)
 @Patient  ID: Jay Holmes, male    DOB: Nov 27, 1955, 68 y.o.   MRN: [id number]  Chief Complaint  Patient presents with   Establish Care    Consult sleep apnea    Referring provider: Plotnikov, Karlynn GAILS, MD  HPI: Jay Holmes is a 68 y/o male with PMH of CAD, GERD, and lung nodules who presents for a new patient consultation for evaluation of sleep.  His wife is present on speaker phone and assists with the history.  He reports a history of snoring and daytime fatigue for years.  He dozes off frequently in the afternoons when sitting on the couch, and has episodes of waking up at night due to snoring.  He denies chest pain, fever, chills, palpitations, HA, dizziness, or other c/o.  Epworth today is noted to be 8.    TEST/EVENTS : no prior sleep tests  No Known Allergies  Immunization History  Administered Date(s) Administered   Fluad Quad(high Dose 65+) 01/15/2019, 02/23/2020, 02/20/2022   Fluad Trivalent(High Dose 65+) 02/24/2023   INFLUENZA, HIGH DOSE SEASONAL PF 01/28/2021   Influenza Whole 02/14/2008, 02/13/2009, 02/03/2010   Influenza,inj,Quad PF,6+ Mos 03/23/2013, 03/10/2014, 02/27/2015, 01/22/2016, 02/18/2017, 01/26/2018   Influenza,inj,quad, With Preservative 02/10/2019   Influenza-Unspecified 03/13/2011   PFIZER(Purple Top)SARS-COV-2 Vaccination 07/26/2019, 08/17/2019, 02/23/2020   PNEUMOCOCCAL CONJUGATE-20 02/27/2022   Pfizer Covid-19 Vaccine Bivalent Booster 47yrs & up 01/28/2021   Tdap 07/21/2012   Zoster Recombinant(Shingrix ) 06/17/2017, 09/21/2017    Past Medical History:  Diagnosis Date   Allergy    Anxiety    Arthritis    Cataract    Diastasis recti 07/21/2012   questionable umbilical hernia   GERD (gastroesophageal reflux disease)    Glaucoma    Hernia of abdominal wall    Personal history of diseases of skin and subcutaneous tissue    Personal history of urinary calculi    Unspecified hypothyroidism    Unspecified sinusitis (chronic)      Tobacco History: Social History   Tobacco Use  Smoking Status Never  Smokeless Tobacco Never   Counseling given: Not Answered   Outpatient Medications Prior to Visit  Medication Sig Dispense Refill   aspirin  EC 81 MG tablet Take 1 tablet (81 mg total) by mouth daily.     Brimonidine  Tartrate-Timolol  (COMBIGAN  OP) Apply 5 mLs to eye.     doxycycline  (MONODOX ) 100 MG capsule Take 100 mg by mouth daily.     Glucosamine-Chondroit-Vit C-Mn (GLUCOSAMINE CHONDR 1500 COMPLX PO) Take by mouth. Chondroitin 1200mg  2 tablets daily     ketoconazole (NIZORAL) 2 % cream Apply 1 application  topically daily as needed (rosacea).     levothyroxine  (SYNTHROID ) 100 MCG tablet TAKE 1 TABLET BY MOUTH DAILY 90 tablet 3   LUMIGAN 0.01 % SOLN SMARTSIG:In Eye(s)     MegaRed Omega-3 Krill Oil 500 MG CAPS Take 1 capsule by mouth every morning.     metFORMIN  (GLUCOPHAGE ) 500 MG tablet 1 tab with dinner 30 tablet 0   omeprazole  (PRILOSEC) 40 MG capsule TAKE 1 CAPSULE BY MOUTH DAILY 90 capsule 3   Vilazodone  HCl 20 MG TABS TAKE 1 TABLET BY MOUTH DAILY 90 tablet 3   VITAMIN D , CHOLECALCIFEROL, PO Take by mouth. OTC     No facility-administered medications prior to visit.     Review of Systems: positive as per HPI  Constitutional:   No  weight loss, night sweats,  Fevers, chills, fatigue, or  lassitude.  HEENT:   No headaches,  Difficulty  swallowing,  Tooth/dental problems, or  Sore throat,                No sneezing, itching, ear ache, nasal congestion, post nasal drip,   CV:  No chest pain,  Orthopnea, PND, swelling in lower extremities, anasarca, dizziness, palpitations, syncope.   GI  No heartburn, indigestion, abdominal pain, nausea, vomiting, diarrhea, change in bowel habits, loss of appetite, bloody stools.   Resp: No shortness of breath with exertion or at rest.  No excess mucus, no productive cough,  No non-productive cough,  No coughing up of blood.  No change in color of mucus.  No wheezing.   No chest wall deformity  Skin: no rash or lesions.  GU: no dysuria, change in color of urine, no urgency or frequency.  No flank pain, no hematuria   MS:  No joint pain or swelling.  No decreased range of motion.  No back pain.    Physical Exam  BP 113/75   Pulse 77   Ht 5' 8 (1.727 m)   Wt 221 lb 4.8 oz (100.4 kg)   SpO2 96%   BMI 33.65 kg/m   GEN: A/Ox3; pleasant , NAD, well nourished    HEENT:  /AT,  EACs-clear, TMs-wnl, NOSE-clear, THROAT-clear, no lesions, no postnasal drip or exudate noted.  Mallampati 4  NECK:  Supple w/ fair ROM; no JVD; normal carotid impulses w/o bruits; no thyromegaly or nodules palpated; no lymphadenopathy.    RESP  Clear  P & A; w/o, wheezes/ rales/ or rhonchi. no accessory muscle use, no dullness to percussion  CARD:  RRR, no m/r/g, no peripheral edema, pulses intact, no cyanosis or clubbing.  GI:   Soft & nt; nml bowel sounds; no organomegaly or masses detected.   Musco: Warm bil, no deformities or joint swelling noted.   Neuro: alert, no focal deficits noted.    Skin: Warm, no lesions or rashes    Lab Results:  CBC    Component Value Date/Time   WBC 5.4 11/26/2022 1427   WBC 6.6 04/30/2022 1506   RBC 5.02 11/26/2022 1427   RBC 5.11 04/30/2022 1506   HGB 16.0 11/26/2022 1427   HCT 48.6 11/26/2022 1427   PLT 193 11/26/2022 1427   MCV 97 11/26/2022 1427   MCH 31.9 11/26/2022 1427   MCH 30.9 05/04/2018 0519   MCHC 32.9 11/26/2022 1427   MCHC 33.5 04/30/2022 1506   RDW 14.1 11/26/2022 1427   LYMPHSABS 1.7 11/26/2022 1427   MONOABS 0.6 04/30/2022 1506   EOSABS 0.2 11/26/2022 1427   BASOSABS 0.1 11/26/2022 1427    BMET    Component Value Date/Time   NA 140 08/11/2023 1012   NA 142 11/26/2022 1427   K 4.7 08/11/2023 1012   CL 106 08/11/2023 1012   CO2 26 08/11/2023 1012   GLUCOSE 164 (H) 08/11/2023 1012   BUN 20 08/11/2023 1012   BUN 19 11/26/2022 1427   CREATININE 1.12 08/11/2023 1012   CALCIUM 9.1 08/11/2023  1012   GFRNONAA >60 05/04/2018 0519   GFRAA >60 05/04/2018 0519    BNP No results found for: BNP  ProBNP    Component Value Date/Time   PROBNP 17.0 04/06/2017 1030    Imaging: No results found.  Administration History     None           No data to display          No results found for: NITRICOXIDE   Assessment &  Plan:   Assessment & Plan Snoring  Daytime sleepiness -  Concern is for sleep apnea with c/o snoring, fatigue, and frequent daytime naps -  Plan for home sleep test; ordered today -  Follow up after sleep test completed and read; ~6 weeks   Return in about 6 weeks (around 02/17/2024) for sleep study review.  Candis Dandy, PA-C 01/06/2024

## 2024-01-15 ENCOUNTER — Telehealth: Payer: Self-pay

## 2024-01-15 NOTE — Telephone Encounter (Signed)
 Received email from [place] with [facility], Mr. Dorsch is now registered and the test kit is shipped.

## 2024-01-15 NOTE — Telephone Encounter (Signed)
 Copied from CRM [id number]. Topic: General - Other >> Jan 15, 2024  9:45 AM Celestine FALCON wrote: Reason for CRM: Pt's wife Ebenezer Mccaskey is calling stating she was given info from [place] to call [facility] for information regarding the pt's sleep study. She called [facility] and was told by answering service that the clinic was supposed to provide details on registering the sleep study for the pt. I did try to find a print out or info on registering the sleep study as I've seen before, but I couldn't locate information on it. Devere is frustrated due to the back and forth nature of the sleep study information, and has requested to speak with an office manager if possible. I called CAL and talked with [facility] who stated she was attempting to get an office manager to speak with Devere. Muffy stated the office manager was in a meeting currently, but would relay for Devere to get a call back today. Susan's number is [phone].  Please see below previous dates times and encounter messages.   01/13/2024 at 2:35pm Roanne Meiers  Reason for CRM: Patient called in regards to getting a sleep study, patient had an office visit on 8/27 and stated he was informed a follow up call would to be made to advise if he needed a sleep study or not.  01/15/2024 at 9:27am [place], Joesph Devere Denley calling in. States they called in on Monday regarding an update for the sleep study ordered, as provider stated they should hear within two days. They did not hear and wanted an update. Caller is upset that they state they called in on Monday and have been waiting the entire week and 'nobody has bothered to call back'. Records indicated the call to clinic was placed 09/03 at around 2:00 PM. No encounter or notes available. Contacted CAL. Spoke to [facility], who states she routed it to the [facility] pool. Muffy, informed that caller is refusing message and wants to speak to someone, placed PAS on hold and retrieved a [facility] to speak to patient. CAL  transferred to [facility] line and PAS merged call. PAS introduced Ms. Massiah to [place], a [facility], and ended call. NFN, Info Only.    I contacted Delon with [facility] who states the patient was not registered and would need to call [phone] option 1 and option 1 again. Spoke with patient's wife who states [facility] told them they were registered. I informed the patient the information given to me by our [facility] rep, Delon. Patient's wife will contact [facility] again to register. Sent another email to [place] to follow up on the patient.

## 2024-01-18 ENCOUNTER — Telehealth: Payer: Self-pay

## 2024-01-18 NOTE — Telephone Encounter (Signed)
 Spoke with pt about NV for flu shot. Pt states appt should be at [facility] not [facility]. Pt scheduled for NV at [facility] on 01/20/24.

## 2024-01-20 ENCOUNTER — Ambulatory Visit (INDEPENDENT_AMBULATORY_CARE_PROVIDER_SITE_OTHER)

## 2024-01-20 ENCOUNTER — Ambulatory Visit

## 2024-01-20 DIAGNOSIS — Z23 Encounter for immunization: Secondary | ICD-10-CM

## 2024-01-20 NOTE — Progress Notes (Addendum)
 After obtaining consent, and per orders of Dr. Posey Rea, injection of HDF given by Ferdie Ping. Patient instructed to report any adverse reaction to me immediately.  Medical screening examination/treatment/procedure(s) were performed by non-physician practitioner and as supervising physician I was immediately available for consultation/collaboration.  I agree with above. Jacinta Shoe, MD

## 2024-01-28 ENCOUNTER — Ambulatory Visit (INDEPENDENT_AMBULATORY_CARE_PROVIDER_SITE_OTHER): Admitting: Adult Health

## 2024-02-02 ENCOUNTER — Ambulatory Visit (INDEPENDENT_AMBULATORY_CARE_PROVIDER_SITE_OTHER): Admitting: Adult Health

## 2024-02-02 ENCOUNTER — Encounter (INDEPENDENT_AMBULATORY_CARE_PROVIDER_SITE_OTHER): Payer: Self-pay | Admitting: Adult Health

## 2024-02-02 VITALS — BP 128/84 | HR 78 | Temp 98.2°F | Ht 68.0 in | Wt 214.0 lb

## 2024-02-02 DIAGNOSIS — Z6832 Body mass index (BMI) 32.0-32.9, adult: Secondary | ICD-10-CM

## 2024-02-02 DIAGNOSIS — E559 Vitamin D deficiency, unspecified: Secondary | ICD-10-CM

## 2024-02-02 DIAGNOSIS — R7303 Prediabetes: Secondary | ICD-10-CM

## 2024-02-02 DIAGNOSIS — R748 Abnormal levels of other serum enzymes: Secondary | ICD-10-CM

## 2024-02-02 MED ORDER — METFORMIN HCL ER 500 MG PO TB24: 500.0000 mg | ORAL_TABLET | Freq: Every day | ORAL | 0 refills | Status: DC

## 2024-02-02 NOTE — Progress Notes (Signed)
 WEIGHT SUMMARY AND BIOMETRICS  Vitals Temp: 98.2 F (36.8 C) BP: 128/84 Pulse Rate: 78 SpO2: 97 %   Anthropometric Measurements Height: 5' 8 (1.727 m) Weight: 214 lb (97.1 kg) BMI (Calculated): 32.55 Weight at Last Visit: 212lb Weight Lost Since Last Visit: 0lb Weight Gained Since Last Visit: 2lb Starting Weight: 213 Total Weight Loss (lbs): 0 lb (0 kg) Peak Weight: 217   Body Composition  Body Fat %: 32.3 % Fat Mass (lbs): 69.2 lbs Muscle Mass (lbs): 138 lbs Total Body Water  (lbs): 102.2 lbs Visceral Fat Rating : 19   Other Clinical Data RMR: [id number] Fasting: no Labs: no Today's Visit #: 8 Starting Date: 11/26/22    Chief Complaint:   OBESITY Jay Holmes is here to discuss his progress with his obesity treatment plan.  He is on the practicing portion control and making smarter food choices, such as increasing vegetables and decreasing simple carbohydrates and states he is following his eating plan approximately 40 % of the time.  He states he is exercising Gym/Walking 60/40 minutes 5/7 times per week.  Interim History:  Last OV at [facility] 05/25/2023- recommended to f/u in 4 weeks Re- established with [facility] on 10/15/2023 He was started Metformin  500mg , he has titrated up to full tablet with dinner. He has been off > 4 weeks He reports late polyphagia and subsequent snacking.  Discussed restarting and converting from plain to XR formulation  Cravings- he endorses intermittent polyphagia, most notably in the evenings. He feels that he will often eat when he isn't truly hungry.  Exercise-Gym Exercise and regular walking  His wife has been helping with limiting snacks and foods off plan in the home. He feels that she has also helped reduce his portion sizes during meal times.  Discussed the benefits of the structured Meal Plan and recommend following Cat 2 to offer more structure and increased protein at meals.  Subjective:   1. Vitamin D  deficiency  Latest  Reference Range & Units 11/26/22 14:27  Vitamin D , 25-Hydroxy 30.0 - 100.0 ng/mL 30.3   He is on daily OTC Vit D3 supplementation Vit D level, low normal and below goal of 50-70  2. Elevated serum alkaline phosphatase level  Latest Reference Range & Units 04/30/22 15:06 11/26/22 14:27 08/11/23 10:12  Alkaline Phosphatase 39 - 117 U/L 107 120 119 (H)  (H): Data is abnormally high  He denies RUQ pain  3. Prediabetes Recently restarted on daily Metformin  500mg - with dinner. He reports late polyphagia and subsequent snacking. He has been off Metformin  500 > 4 weeks Recommend restarting and converting to XR formulation. Recommend that he follow a more guided eating plan, ie Cat 2  Assessment/Plan:   1. Vitamin D  deficiency (Primary) Monitor Labs Continue OTC supplementation  2. Elevated serum alkaline phosphatase level Continue weight loss efforts   3. Prediabetes Stop Metformin  500mg  daily Start metFORMIN  (GLUCOPHAGE -XR) 500 MG 24 hr tablet Take 1 tablet (500 mg total) by mouth daily with supper. Dispense: 30 tablet, Refills: 0 ordered   4. BMI 32.0-32.9,adult - current BMI 32.6  Jay Holmes is not currently in the action stage of change. As such, his goal is to get back to weightloss efforts . He has agreed to the Category 2 Plan.   Exercise goals: Older adults should follow the adult guidelines. When older adults cannot meet the adult guidelines, they should be as physically active as their abilities and conditions will allow.  Older adults should do exercises that maintain or  improve balance if they are at risk of falling.  Older adults should determine their level of effort for physical activity relative to their level of fitness.  Older adults with chronic conditions should understand whether and how their conditions affect their ability to do regular physical activity safely.  Behavioral modification strategies: increasing lean protein intake, decreasing simple carbohydrates,  increasing vegetables, increasing water  intake, no skipping meals, meal planning and cooking strategies, keeping healthy foods in the home, ways to avoid boredom eating, ways to avoid night time snacking, better snacking choices, emotional eating strategies, planning for success, and decreasing junk food.  Jay Holmes has agreed to follow-up with our clinic in 4 weeks. He was informed of the importance of frequent follow-up visits to maximize his success with intensive lifestyle modifications for his multiple health conditions.   Check Fasting Labs at next OV  Objective:   Blood pressure 128/84, pulse 78, temperature 98.2 F (36.8 C), height 5' 8 (1.727 m), weight 214 lb (97.1 kg), SpO2 97%. Body mass index is 32.54 kg/m.  General: Cooperative, alert, well developed, in no acute distress. HEENT: Conjunctivae and lids unremarkable. Cardiovascular: Regular rhythm.  Lungs: Normal work of breathing. Neurologic: No focal deficits.   Lab Results  Component Value Date   CREATININE 1.12 08/11/2023   BUN 20 08/11/2023   NA 140 08/11/2023   K 4.7 08/11/2023   CL 106 08/11/2023   CO2 26 08/11/2023   Lab Results  Component Value Date   ALT 23 08/11/2023   AST 20 08/11/2023   ALKPHOS 119 (H) 08/11/2023   BILITOT 0.5 08/11/2023   Lab Results  Component Value Date   HGBA1C 6.1 08/11/2023   HGBA1C 6.3 (H) 11/26/2022   HGBA1C 6.3 02/20/2022   HGBA1C 6.2 02/07/2021   HGBA1C 6.1 06/26/2020   Lab Results  Component Value Date   INSULIN  25.6 (H) 11/26/2022   Lab Results  Component Value Date   TSH 1.95 08/11/2023   Lab Results  Component Value Date   CHOL 156 11/26/2022   HDL 41 11/26/2022   LDLCALC 94 11/26/2022   LDLDIRECT 74.0 02/07/2021   TRIG 117 11/26/2022   CHOLHDL 3 05/20/2021   Lab Results  Component Value Date   VD25OH 30.3 11/26/2022   VD25OH 40.27 10/12/2019   Lab Results  Component Value Date   WBC 5.4 11/26/2022   HGB 16.0 11/26/2022   HCT 48.6 11/26/2022    MCV 97 11/26/2022   PLT 193 11/26/2022   No results found for: IRON, TIBC, FERRITIN  Attestation Statements:   Reviewed by clinician on day of visit: allergies, medications, problem list, medical history, surgical history, family history, social history, and previous encounter notes.  I have reviewed the above documentation for accuracy and completeness, and I agree with the above. -  Shafer Swamy d. Amaia Lavallie, NP-C

## 2024-02-03 ENCOUNTER — Ambulatory Visit (INDEPENDENT_AMBULATORY_CARE_PROVIDER_SITE_OTHER): Admitting: Adult Health

## 2024-02-10 ENCOUNTER — Ambulatory Visit: Admitting: Internal Medicine

## 2024-02-15 ENCOUNTER — Encounter: Payer: Self-pay | Admitting: Internal Medicine

## 2024-02-15 ENCOUNTER — Ambulatory Visit: Admitting: Internal Medicine

## 2024-02-15 ENCOUNTER — Telehealth: Payer: Self-pay

## 2024-02-15 VITALS — BP 122/80 | HR 84 | Temp 98.2°F | Ht 68.0 in | Wt 217.0 lb

## 2024-02-15 DIAGNOSIS — R413 Other amnesia: Secondary | ICD-10-CM

## 2024-02-15 DIAGNOSIS — H6123 Impacted cerumen, bilateral: Secondary | ICD-10-CM

## 2024-02-15 DIAGNOSIS — E039 Hypothyroidism, unspecified: Secondary | ICD-10-CM

## 2024-02-15 DIAGNOSIS — I2583 Coronary atherosclerosis due to lipid rich plaque: Secondary | ICD-10-CM

## 2024-02-15 DIAGNOSIS — H6120 Impacted cerumen, unspecified ear: Secondary | ICD-10-CM | POA: Insufficient documentation

## 2024-02-15 DIAGNOSIS — K219 Gastro-esophageal reflux disease without esophagitis: Secondary | ICD-10-CM | POA: Diagnosis not present

## 2024-02-15 DIAGNOSIS — R1310 Dysphagia, unspecified: Secondary | ICD-10-CM

## 2024-02-15 DIAGNOSIS — I2510 Atherosclerotic heart disease of native coronary artery without angina pectoris: Secondary | ICD-10-CM

## 2024-02-15 DIAGNOSIS — Z0000 Encounter for general adult medical examination without abnormal findings: Secondary | ICD-10-CM

## 2024-02-15 LAB — COMPREHENSIVE METABOLIC PANEL WITH GFR
ALT: 32 U/L (ref 0–53)
AST: 23 U/L (ref 0–37)
Albumin: 4.4 g/dL (ref 3.5–5.2)
Alkaline Phosphatase: 111 U/L (ref 39–117)
BUN: 17 mg/dL (ref 6–23)
CO2: 23 meq/L (ref 19–32)
Calcium: 9.3 mg/dL (ref 8.4–10.5)
Chloride: 104 meq/L (ref 96–112)
Creatinine, Ser: 1 mg/dL (ref 0.40–1.50)
GFR: 76.82 mL/min (ref >60.00)
Glucose, Bld: 99 mg/dL (ref 70–99)
Potassium: 4.1 meq/L (ref 3.5–5.1)
Sodium: 142 meq/L (ref 135–145)
Total Bilirubin: 0.8 mg/dL (ref 0.2–1.2)
Total Protein: 7 g/dL (ref 6.0–8.3)

## 2024-02-15 LAB — CBC WITH DIFFERENTIAL/PLATELET
Basophils Absolute: 0.1 K/uL (ref 0.0–0.1)
Basophils Relative: 1.1 % (ref 0.0–3.0)
Eosinophils Absolute: 0.2 K/uL (ref 0.0–0.7)
Eosinophils Relative: 3.8 % (ref 0.0–5.0)
HCT: 46 % (ref 39.0–52.0)
Hemoglobin: 15.5 g/dL (ref 13.0–17.0)
Lymphocytes Relative: 31.7 % (ref 12.0–46.0)
Lymphs Abs: 1.7 K/uL (ref 0.7–4.0)
MCHC: 33.6 g/dL (ref 30.0–36.0)
MCV: 93.1 fl (ref 78.0–100.0)
Monocytes Absolute: 0.5 K/uL (ref 0.1–1.0)
Monocytes Relative: 8.9 % (ref 3.0–12.0)
Neutro Abs: 2.8 K/uL (ref 1.4–7.7)
Neutrophils Relative %: 54.5 % (ref 43.0–77.0)
Platelets: 190 K/uL (ref 150.0–400.0)
RBC: 4.94 Mil/uL (ref 4.22–5.81)
RDW: 14.1 % (ref 11.5–15.5)
WBC: 5.2 K/uL (ref 4.0–10.5)

## 2024-02-15 LAB — URINALYSIS
Bilirubin Urine: NEGATIVE
Hgb urine dipstick: NEGATIVE
Ketones, ur: NEGATIVE
Leukocytes,Ua: NEGATIVE
Nitrite: NEGATIVE
Specific Gravity, Urine: 1.025 (ref 1.000–1.030)
Total Protein, Urine: NEGATIVE
Urine Glucose: NEGATIVE
Urobilinogen, UA: 0.2 (ref 0.0–1.0)
pH: 6 (ref 5.0–8.0)

## 2024-02-15 LAB — LIPID PANEL
Cholesterol: 136 mg/dL (ref 0–200)
HDL: 40.5 mg/dL (ref >39.00)
LDL Cholesterol: 73 mg/dL (ref 0–99)
NonHDL: 95.59
Total CHOL/HDL Ratio: 3
Triglycerides: 115 mg/dL (ref 0.0–149.0)
VLDL: 23 mg/dL (ref 0.0–40.0)

## 2024-02-15 LAB — TSH: TSH: 4.57 u[IU]/mL (ref 0.35–5.50)

## 2024-02-15 LAB — PSA: PSA: 0.28 ng/mL (ref 0.10–4.00)

## 2024-02-15 MED ORDER — PANTOPRAZOLE SODIUM 40 MG PO TBEC: 40.0000 mg | DELAYED_RELEASE_TABLET | Freq: Every day | ORAL | 3 refills | Status: AC

## 2024-02-15 MED ORDER — LEVOTHYROXINE SODIUM 100 MCG PO TABS: 100.0000 ug | ORAL_TABLET | Freq: Every day | ORAL | 3 refills | Status: AC

## 2024-02-15 MED ORDER — MEGARED OMEGA-3 KRILL OIL 500 MG PO CAPS: 1.0000 | ORAL_CAPSULE | Freq: Every morning | ORAL | Status: AC

## 2024-02-15 MED ORDER — ASPIRIN 81 MG PO TBEC: 81.0000 mg | DELAYED_RELEASE_TABLET | Freq: Every day | ORAL | Status: AC

## 2024-02-15 MED ORDER — VILAZODONE HCL 20 MG PO TABS: 1.0000 | ORAL_TABLET | Freq: Every day | ORAL | 3 refills | Status: AC

## 2024-02-15 NOTE — Assessment & Plan Note (Signed)
 Worse D/c Omeprazole  Start Protonix  GI consult

## 2024-02-15 NOTE — Assessment & Plan Note (Signed)
 New w/couging D/c Omeprazole  Start Protonix  GI consult - Dr Albertus

## 2024-02-15 NOTE — Assessment & Plan Note (Signed)
  We discussed age appropriate health related issues, including available/recomended screening tests and vaccinations. Labs were ordered to be later reviewed . All questions were answered. We discussed one or more of the following - seat belt use, use of sunscreen/sun exposure exercise, fall risk reduction, second hand smoke exposure, firearm use and storage, seat belt use, a need for adhering to healthy diet and exercise. Labs were ordered.  All questions were answered. Last colon 2021.   Next due on 01/15/2030 Dr Albertus   Coronary calcium CT score of 82 - 2022 Statin or fish oil advised Nutrition consult is pending

## 2024-02-15 NOTE — Assessment & Plan Note (Signed)
 Mild Use Debrox kit Hearing is good

## 2024-02-15 NOTE — Assessment & Plan Note (Signed)
 Coronary calcium CT score of 82 - 2022 Statin advised. Pt declined Nutrition consult is pending On Fish oil

## 2024-02-15 NOTE — Assessment & Plan Note (Signed)
 On Levothyroxine

## 2024-02-15 NOTE — Telephone Encounter (Signed)
 Copied from CRM #[id number]. Topic: Clinical - Medical Advice >> Feb 15, 2024 10:11 AM Carlatta H wrote: Reason for CRM: Please call the patient back about memory issues and next steps//

## 2024-02-15 NOTE — Patient Instructions (Signed)
Debrox kit

## 2024-02-15 NOTE — Assessment & Plan Note (Addendum)
 Discussed Good hearing Options to diagnose/treat discussed Neurology ref was offered

## 2024-02-15 NOTE — Progress Notes (Signed)
 Subjective:  Patient ID: Jay Holmes, male    DOB: 1956-03-09  Age: 68 y.o. MRN: [id number]  CC: Annual Exam   HPI AMRON GUERRETTE presents for a well exam C/o memory issues per family C/o cough w/eating - some choking onfood (mild) x months  Outpatient Medications Prior to Visit  Medication Sig Dispense Refill   Brimonidine  Tartrate-Timolol  (COMBIGAN  OP) Apply 5 mLs to eye.     doxycycline  (MONODOX ) 100 MG capsule Take 100 mg by mouth daily.     Glucosamine-Chondroit-Vit C-Mn (GLUCOSAMINE CHONDR 1500 COMPLX PO) Take by mouth. Chondroitin 1200mg  2 tablets daily     ketoconazole (NIZORAL) 2 % cream Apply 1 application  topically daily as needed (rosacea).     LUMIGAN 0.01 % SOLN SMARTSIG:In Eye(s)     metFORMIN  (GLUCOPHAGE -XR) 500 MG 24 hr tablet Take 1 tablet (500 mg total) by mouth daily with supper. 30 tablet 0   VITAMIN D , CHOLECALCIFEROL, PO Take by mouth. OTC     aspirin  EC 81 MG tablet Take 1 tablet (81 mg total) by mouth daily.     levothyroxine  (SYNTHROID ) 100 MCG tablet TAKE 1 TABLET BY MOUTH DAILY 90 tablet 3   MegaRed Omega-3 Krill Oil 500 MG CAPS Take 1 capsule by mouth every morning.     omeprazole  (PRILOSEC) 40 MG capsule TAKE 1 CAPSULE BY MOUTH DAILY 90 capsule 3   Vilazodone  HCl 20 MG TABS TAKE 1 TABLET BY MOUTH DAILY 90 tablet 3   No facility-administered medications prior to visit.    ROS: Review of Systems  Constitutional:  Negative for appetite change, fatigue and unexpected weight change.  HENT:  Negative for congestion, nosebleeds, sneezing, sore throat and trouble swallowing.   Eyes:  Negative for itching and visual disturbance.  Respiratory:  Negative for cough.   Cardiovascular:  Negative for chest pain, palpitations and leg swelling.  Gastrointestinal:  Negative for abdominal distention, blood in stool, diarrhea and nausea.  Genitourinary:  Negative for frequency and hematuria.  Musculoskeletal:  Negative for back pain, gait problem, joint  swelling and neck pain.  Skin:  Negative for rash.  Neurological:  Negative for dizziness, tremors, speech difficulty and weakness.  Psychiatric/Behavioral:  Positive for decreased concentration. Negative for agitation, confusion, dysphoric mood, sleep disturbance and suicidal ideas. The patient is not nervous/anxious.     Objective:  BP 122/80 (BP Location: Left Arm, Patient Position: Sitting, Cuff Size: Normal)   Pulse 84   Temp 98.2 F (36.8 C) (Oral)   Ht 5' 8 (1.727 m)   Wt 217 lb (98.4 kg)   SpO2 98%   BMI 32.99 kg/m   BP Readings from Last 3 Encounters:  02/15/24 122/80  02/02/24 128/84  01/06/24 113/75    Wt Readings from Last 3 Encounters:  02/15/24 217 lb (98.4 kg)  02/02/24 214 lb (97.1 kg)  01/06/24 221 lb 4.8 oz (100.4 kg)    Physical Exam Constitutional:      General: He is not in acute distress.    Appearance: He is well-developed. He is obese.     Comments: NAD  Eyes:     Conjunctiva/sclera: Conjunctivae normal.     Pupils: Pupils are equal, round, and reactive to light.  Neck:     Thyroid : No thyromegaly.     Vascular: No JVD.  Cardiovascular:     Rate and Rhythm: Normal rate and regular rhythm.     Heart sounds: Normal heart sounds. No murmur heard.    No  friction rub. No gallop.  Pulmonary:     Effort: Pulmonary effort is normal. No respiratory distress.     Breath sounds: Normal breath sounds. No wheezing or rales.  Chest:     Chest wall: No tenderness.  Abdominal:     General: Bowel sounds are normal. There is no distension.     Palpations: Abdomen is soft. There is no mass.     Tenderness: There is no abdominal tenderness. There is no guarding or rebound.  Genitourinary:    Prostate: Normal.     Rectum: Normal. Guaiac result negative.  Musculoskeletal:        General: No tenderness. Normal range of motion.     Cervical back: Normal range of motion.  Lymphadenopathy:     Cervical: No cervical adenopathy.  Skin:    General: Skin is  warm and dry.     Findings: No rash.  Neurological:     Mental Status: He is alert and oriented to person, place, and time.     Cranial Nerves: No cranial nerve deficit.     Motor: No abnormal muscle tone.     Coordination: Coordination normal.     Gait: Gait normal.     Deep Tendon Reflexes: Reflexes are normal and symmetric.  Psychiatric:        Behavior: Behavior normal.        Thought Content: Thought content normal.        Judgment: Judgment normal.   Wax B Prostate WNL  Lab Results  Component Value Date   WBC 5.4 11/26/2022   HGB 16.0 11/26/2022   HCT 48.6 11/26/2022   PLT 193 11/26/2022   GLUCOSE 164 (H) 08/11/2023   CHOL 156 11/26/2022   TRIG 117 11/26/2022   HDL 41 11/26/2022   LDLDIRECT 74.0 02/07/2021   LDLCALC 94 11/26/2022   ALT 23 08/11/2023   AST 20 08/11/2023   NA 140 08/11/2023   K 4.7 08/11/2023   CL 106 08/11/2023   CREATININE 1.12 08/11/2023   BUN 20 08/11/2023   CO2 26 08/11/2023   TSH 1.95 08/11/2023   PSA 0.31 02/10/2023   INR 0.94 04/28/2018   HGBA1C 6.1 08/11/2023    US  ABDOMEN LIMITED RUQ (LIVER/GB) Result Date: 08/13/2023 CLINICAL DATA:  Elevated alkaline phosphatase. EXAM: ULTRASOUND ABDOMEN LIMITED RIGHT UPPER QUADRANT COMPARISON:  August 16, 2009 FINDINGS: Gallbladder: No gallstones or wall thickening visualized. No sonographic Murphy sign noted by sonographer. Common bile duct: Not well seen per ultrasound technologist. Liver: No focal lesion. Increased echotexture. Portal vein is patent on color Doppler imaging with normal direction of blood flow towards the liver. Other: None. IMPRESSION: 1. No acute abnormality identified. 2. Increased echotexture of the liver. This is a nonspecific finding but can be seen in fatty infiltration of liver. Electronically Signed   By: Craig Farr M.D.   On: 08/13/2023 09:35    Assessment & Plan:   Problem List Items Addressed This Visit     CAD (coronary artery disease)   Coronary calcium CT score of  82 - 2022 Statin advised. Pt declined Nutrition consult is pending On Fish oil      Relevant Medications   aspirin  EC 81 MG tablet   Cerumen impaction   Mild Use Debrox kit Hearing is good      Dysphagia   New w/couging D/c Omeprazole  Start Protonix  GI consult - Dr Albertus      Relevant Orders   Ambulatory referral to Gastroenterology   GERD (  gastroesophageal reflux disease)   Worse D/c Omeprazole  Start Protonix  GI consult      Relevant Medications   pantoprazole  (PROTONIX ) 40 MG tablet   Other Relevant Orders   TSH   Urinalysis   CBC with Differential/Platelet   Lipid panel   Comprehensive metabolic panel with GFR   PSA   Ambulatory referral to Gastroenterology   Hypothyroidism   On Levothyroxine       Relevant Medications   levothyroxine  (SYNTHROID ) 100 MCG tablet   Memory changes   Discussed Good hearing Options to diagnose/treat discussed Neurology ref was offered      Well adult exam - Primary    We discussed age appropriate health related issues, including available/recomended screening tests and vaccinations. Labs were ordered to be later reviewed . All questions were answered. We discussed one or more of the following - seat belt use, use of sunscreen/sun exposure exercise, fall risk reduction, second hand smoke exposure, firearm use and storage, seat belt use, a need for adhering to healthy diet and exercise. Labs were ordered.  All questions were answered. Last colon 2021.   Next due on 01/15/2030 Dr Albertus   Coronary calcium CT score of 82 - 2022 Statin or fish oil advised Nutrition consult is pending       Relevant Orders   TSH   Urinalysis   CBC with Differential/Platelet   Lipid panel   Comprehensive metabolic panel with GFR   PSA      Meds ordered this encounter  Medications   aspirin  EC 81 MG tablet    Sig: Take 1 tablet (81 mg total) by mouth daily.   levothyroxine  (SYNTHROID ) 100 MCG tablet    Sig: Take 1 tablet (100 mcg  total) by mouth daily.    Dispense:  90 tablet    Refill:  3    Please send a replace/new response with 90-Day Supply if appropriate to maximize member benefit. Requesting 1 year supply.   MegaRed Omega-3 Krill Oil 500 MG CAPS    Sig: Take 1 capsule by mouth every morning.   Vilazodone  HCl 20 MG TABS    Sig: Take 1 tablet (20 mg total) by mouth daily.    Dispense:  90 tablet    Refill:  3    Please send a replace/new response with 90-Day Supply if appropriate to maximize member benefit. Requesting 1 year supply.   pantoprazole  (PROTONIX ) 40 MG tablet    Sig: Take 1 tablet (40 mg total) by mouth daily.    Dispense:  90 tablet    Refill:  3      Follow-up: Return in about 6 months (around 08/15/2024) for a follow-up visit.  Marolyn Noel, MD

## 2024-02-17 ENCOUNTER — Ambulatory Visit: Payer: Self-pay | Admitting: Internal Medicine

## 2024-02-19 NOTE — Addendum Note (Signed)
 Addended by: Gavon Majano V on: 02/19/2024 09:15 AM   Modules accepted: Orders

## 2024-02-19 NOTE — Telephone Encounter (Signed)
 I will make a referral to see a specialist. Thx

## 2024-02-23 NOTE — Progress Notes (Incomplete)
 Assessment/Plan:   Jay Holmes is a very pleasant 68 y.o. year old RH male with a history of hypothyroidism, GERD, CAD, vit D deficiency seen today for evaluation of memory loss. MoCA today is 25/30. Etiology is unclear, workup is in progress. A component of depression and anxiety may be present.  Patient is able to participate on ADLs   Patient continues to drive without significant difficulties.     Memory Impairment of unclear etiology  MRI brain with and without contrast to assess for underlying structural abnormality and assess vascular load  Neurocognitive testing to further evaluate cognitive concerns and determine other underlying cause of memory changes, including potential contribution from sleep, anxiety, attention, or depression  Check B12 , D Continue to control mood as per PCP, recommend psychotherapy for emotional distress  Recommend good control of cardiovascular risk factors Folllow up pending on the above results  Subjective:   The patient is accompanied by his wife who supplement the history.   How long did patient have memory difficulties?  They noticed more than I did. Wife reports that he may have some memory difficulties for the last 12 years, after retirement with  some difficulty remembering new information, conversations and names.  Long-term memory is good. Struggles keeping up with daily things according to her.  repeats oneself?  Endorsed by his wife Disoriented when walking into a room?  Denies  Leaving objects in unusual places? Denies.   Wandering behavior?  denies .  Any personality changes?  Denies.   Any history of depression?:  Never been diagnosed with depression or anxiety. He is difficult. Argumentative, easily angry worse recently. The retirement did not help.  Has become very dependent on me his wife says.  This has been witnessed during this visit, as they were in discord frequently. Hallucinations or paranoia?  Denies   Seizures?   Denies    Any sleep changes?  Does not sleep well, denies vivid dreams, REM behavior or sleepwalking   Sleep apnea? Refuses sleep studies, wife feels that he has sleep apnea. Any hygiene concerns?  Denies   Independent of bathing and dressing?  Endorsed  Does the patient needs help with medications? Patient is in charge. I keep them in the bathroom.   Who is in charge of the finances? Patient is in charge.  Had some issues with the water  bill-wife says, he disagrees.   Any changes in appetite?  Denies     Patient have trouble swallowing? Has reflux, to undergo studies soon.   Does the patient cook? No    Any kitchen accidents such as leaving the stove on? Denies.   Any history of headaches?   Denies.   Chronic pain ? Denies.   Ambulates with difficulty?  Denies. I was walking more, but since the dog is sick I do less, but I go to the gym.   Recent falls or head injuries? Denies.   Vision changes? Denies.   Any stroke like symptoms? Denies.   Any tremors?   Denies.   Any anosmia?  Denies.   Any incontinence of urine? Denies.   Any bowel dysfunction? Denies.      Patient lives with wife and son History of heavy alcohol intake? Denies.   History of heavy tobacco use? Denies.   Family history of dementia? Mother had dementia ?type  Does patient drive? 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  Pertinent available labs: TSH 4.57  Past Medical History:  Diagnosis Date   Allergy    Anxiety    Arthritis    Cataract    Diastasis recti 07/21/2012   questionable umbilical hernia   GERD (gastroesophageal reflux disease)    Glaucoma    Hernia of abdominal wall    Personal history of diseases of skin and subcutaneous tissue    Personal history of urinary calculi    Unspecified hypothyroidism    Unspecified sinusitis (chronic)      Past Surgical History:  Procedure Laterality Date   Cataract surgery Bilateral    COLONOSCOPY      EYE SURGERY      JOINT REPLACEMENT     TONSILLECTOMY AND ADENOIDECTOMY     TOTAL KNEE ARTHROPLASTY Right 05/03/2018   Procedure: TOTAL KNEE ARTHROPLASTY;  Surgeon: Melodi Lerner, MD;  Location: [facility];  Service: Orthopedics;  Laterality: Right;     No Known Allergies  Current Outpatient Medications  Medication Instructions   aspirin  EC 81 mg, Oral, Daily   Brimonidine  Tartrate-Timolol  (COMBIGAN  OP) 5 mLs   doxycycline  (MONODOX ) 100 mg, Daily   Glucosamine-Chondroit-Vit C-Mn (GLUCOSAMINE CHONDR 1500 COMPLX PO) Take by mouth. Chondroitin 1200mg  2 tablets daily   ketoconazole (NIZORAL) 2 % cream 1 application , Daily PRN   levothyroxine  (SYNTHROID ) 100 mcg, Oral, Daily   LUMIGAN 0.01 % SOLN SMARTSIG:In Eye(s)   MegaRed Omega-3 Krill Oil 500 MG CAPS 1 capsule, Oral, Every morning   metFORMIN  (GLUCOPHAGE -XR) 500 mg, Oral, Daily with supper   pantoprazole  (PROTONIX ) 40 mg, Oral, Daily   Vilazodone  HCl 20 mg, Oral, Daily   VITAMIN D , CHOLECALCIFEROL, PO Take by mouth. OTC     VITALS:   Vitals:   02/24/24 0754  BP: 127/83  Pulse: 79  Resp: 20  SpO2: 95%  Weight: 218 lb (98.9 kg)  Height: 5' 8.5 (1.74 m)         02/24/2024    8:00 AM  Montreal Cognitive Assessment   Visuospatial/ Executive (0/5) 5  Naming (0/3) 3  Attention: Read list of digits (0/2) 2  Attention: Read list of letters (0/1) 1  Attention: Serial 7 subtraction starting at 100 (0/3) 3  Language: Repeat phrase (0/2) 2  Language : Fluency (0/1) 1  Abstraction (0/2) 2  Delayed Recall (0/5) 1  Orientation (0/6) 5  Total 25  Adjusted Score (based on education) 25        No data to display           PHYSICAL EXAM   HEENT:  Normocephalic, atraumatic. The superficial temporal arteries are without ropiness or tenderness. Cardiovascular: Regular rate and rhythm. Lungs: Clear to auscultation bilaterally. Neck: There are no carotid bruits noted bilaterally.  Orientation:  Alert and oriented to person, place and time. No  aphasia or dysarthria. Fund of knowledge is appropriate. Recent memory impaired and remote memory intact.  Attention and concentration are normal.  Able to name objects and repeat phrases. Delayed recall  1/5 Cranial nerves: There is good facial symmetry. Extraocular muscles are intact and visual fields are full to confrontational testing. Speech is fluent and clear. No tongue deviation. Hearing is intact to conversational tone. Tone: Tone is good throughout. Abnormal movements: No tremors. No Asterixis. No Fasciculations Sensation: Sensation is intact to light touch. Vibration is intact at the bilateral big toe.  Coordination: The patient has no difficulty with RAM's or FNF bilaterally. Normal finger to nose  Motor: Strength is 5/5 in the bilateral upper and lower extremities.  There is no pronator drift. There are no fasciculations noted. DTR's: Deep tendon reflexes are 2/4 bilaterally. Gait and Station: The patient is able to ambulate without difficulty The patient is able to heel toe walk. Gait is cautious and narrow. The patient is able to ambulate in a tandem fashion.       Thank you for allowing us  the opportunity to participate in the care of this nice patient. Please do not hesitate to contact us  for any questions or concerns.   Total time spent on today's visit was 55 minutes dedicated to this patient today, preparing to see patient, examining the patient, ordering tests and/or medications and counseling the patient, documenting clinical information in the EHR or other health record, independently interpreting results and communicating results to the patient/family, discussing treatment and goals, answering patient's questions and coordinating care.  Cc:  Plotnikov, Karlynn GAILS, MD  Camie Sevin 02/24/2024 8:56 AM

## 2024-02-24 ENCOUNTER — Telehealth: Payer: Self-pay

## 2024-02-24 ENCOUNTER — Ambulatory Visit

## 2024-02-24 ENCOUNTER — Ambulatory Visit (INDEPENDENT_AMBULATORY_CARE_PROVIDER_SITE_OTHER): Admitting: Physician Assistant

## 2024-02-24 ENCOUNTER — Other Ambulatory Visit

## 2024-02-24 ENCOUNTER — Encounter: Payer: Self-pay | Admitting: Physician Assistant

## 2024-02-24 VITALS — BP 127/83 | HR 79 | Resp 20 | Ht 68.5 in | Wt 218.0 lb

## 2024-02-24 DIAGNOSIS — I2510 Atherosclerotic heart disease of native coronary artery without angina pectoris: Secondary | ICD-10-CM

## 2024-02-24 DIAGNOSIS — R413 Other amnesia: Secondary | ICD-10-CM

## 2024-02-24 NOTE — Telephone Encounter (Signed)
 Copied from CRM [id number]. Topic: Clinical - Medication Question >> Feb 24, 2024  9:06 AM Lonell PEDLAR wrote: Reason for CRM: Patient's wife, Devere called stating patient does not recall declining the Statin and would like to be prescribed the medication. She would like a call back to speak with a nurse further regarding this. [phone]

## 2024-02-24 NOTE — Patient Instructions (Signed)
 Labs [address] Mri at [facility] imaging [phone]

## 2024-02-24 NOTE — Telephone Encounter (Signed)
 Patient has visit with neurologist today

## 2024-02-25 ENCOUNTER — Ambulatory Visit: Payer: Self-pay | Admitting: Physician Assistant

## 2024-02-25 LAB — VITAMIN B12: Vitamin B-12: 540 pg/mL (ref 200–1100)

## 2024-02-25 LAB — VITAMIN D 25 HYDROXY (VIT D DEFICIENCY, FRACTURES): Vit D, 25-Hydroxy: 39 ng/mL (ref 30–100)

## 2024-02-26 MED ORDER — ROSUVASTATIN CALCIUM 5 MG PO TABS: 5.0000 mg | ORAL_TABLET | Freq: Every day | ORAL | 2 refills | Status: AC

## 2024-02-26 NOTE — Addendum Note (Signed)
 Addended by: Aidden Markovic V on: 02/26/2024 12:24 PM   Modules accepted: Orders

## 2024-02-26 NOTE — Telephone Encounter (Signed)
 Okay.  Prescription sent to [facility].  Start taking daily.  Return to clinic in 3 months with labs (lipids, LFTs) prior.  Thank you

## 2024-03-01 ENCOUNTER — Telehealth: Payer: Self-pay

## 2024-03-01 ENCOUNTER — Ambulatory Visit (INDEPENDENT_AMBULATORY_CARE_PROVIDER_SITE_OTHER): Admitting: Adult Health

## 2024-03-01 NOTE — Telephone Encounter (Signed)
 Resolved

## 2024-03-10 ENCOUNTER — Other Ambulatory Visit

## 2024-03-10 ENCOUNTER — Ambulatory Visit: Payer: Self-pay

## 2024-03-10 ENCOUNTER — Ambulatory Visit (INDEPENDENT_AMBULATORY_CARE_PROVIDER_SITE_OTHER): Admitting: Psychology

## 2024-03-10 DIAGNOSIS — F0670 Mild neurocognitive disorder due to known physiological condition without behavioral disturbance: Secondary | ICD-10-CM

## 2024-03-10 DIAGNOSIS — R4189 Other symptoms and signs involving cognitive functions and awareness: Secondary | ICD-10-CM

## 2024-03-10 NOTE — Progress Notes (Signed)
 NEUROPSYCHOLOGICAL EVALUATION [place]. [facility]  [place] Department of Neurology  Date of Evaluation: 03/10/2024  REASON FOR REFERRAL   Jay Holmes is a 68 year old, right-handed, White male with 14 years of formal education. He was referred for neuropsychological evaluation by Camie Sevin, PA-C, to assess current neurocognitive functioning, document potential cognitive deficits, and assist with treatment planning. This is his first neuropsychological evaluation.  SUMMARY OF RESULTS   Premorbid cognitive abilities are estimated to be in the high average range based on word reading and sociodemographic factors.   Relative to this baseline estimate, current performance was below expectations on tasks of semantic fluency and learning/memory. Regarding the latter, he demonstrated difficulty encoding, recalling, and recognizing a word list. His immediate recall of short stories was relatively stronger; however, delayed recall and recognition of the stories were below expectations. Additionally, he demonstrated poor immediate and delayed recall of shapes, although recognition of the shapes was relatively preserved.  Remaining measures were within age-related expectations, including attention/working memory, processing speed, executive functioning, confrontation naming, and visuospatial abilities.  On self-report questionnaires, he did not report significant symptoms of depression or anxiety.  DIAGNOSTIC IMPRESSION   Results of the current evaluation indicated deficits in semantic fluency and learning/memory. In the setting of preserved functional independence, findings support a diagnosis of mild neurocognitive disorder (mild cognitive impairment).   In the absence of neuroimaging, the underlying etiology of these deficits is not fully clear. Although the patient reported test anxiety, intermittent mood symptoms, and possible untreated sleep apnea--which can certainly contribute  to cognitive difficulties--the pattern of deficits observed is not necessarily characteristic of these factors alone and raises concern for an emerging neurodegenerative process. While memory difficulties may reflect low initial acquisition, the degree of forgetting and the inconsistent benefit from recognition cueing is generally unexpected. Further neurological workup (e.g., blood-based biomarkers, neuroimaging, cerebrospinal fluid analysis) may help provide additional diagnostic clarification. Serial cognitive assessment will also be important for monitoring progression and clarifying the nature of the deficits.  Once the brain MRI results are available, they can be correlated with today's test findings to determine whether any structural abnormalities may explain the current cognitive profile.  ICD-10 Codes: F06.70 Mild neurocognitive disorder (mild cognitive impairment)   RECOMMENDATIONS   A repeat neuropsychological evaluation in 12-18 months (or sooner if functional decline is noted) is recommended.  Discuss with your neurologist the risks and benefits of starting a medication that can help slow memory decline.  Continue to monitor mood symptoms, especially given that emotional distress can exacerbate cognitive difficulties. Discuss current medication regimen with your prescribing provider to ensure you are receiving maximum benefit. If symptoms begin to interfere with daily functioning, you may wish to reconsider exploring additional treatment options, such as mindfulness, relaxation techniques, or counseling.  Prioritize physical health through diet, exercise, and sleep. Regular physical activity supports cardiovascular health, improves mood, and helps preserve mobility and independence. Aim for at least 150 minutes of moderate aerobic exercise per week (e.g., brisk walking, swimming, gardening). A brain-healthy diet such as the Mediterranean or MIND diet is rich in fruits, vegetables, whole  grains, healthy fats, and lean proteins, and has been associated with reduced risk of cognitive decline. Additionally, getting adequate, quality sleep and managing chronic conditions with the help of healthcare providers are essential components of healthy aging.  Continue to stay socially and mentally engaged. Maintaining strong social connections and regularly stimulating your brain can help protect against cognitive decline. This includes staying connected with friends and  family, volunteering, or participating in community groups. Mentally engaging activities--such as reading, doing puzzles, playing strategy games, or learning a new language or musical instrument--promote brain plasticity. If you are interested in activities to support cognitive engagement, this site offers a variety of apps and games organized by difficulty level:  https://www.[facility]row[facility].org/get-to-know-barrow/centers-programs/neurorehabilitation-center/neuro-rehab-apps-and-games/  Consider implementing compensatory strategies to maximize independence and maintain daily functioning. Examples include:  Adhere to routine. Compensatory strategies work best when they are used consistently. Use a planner, calendar, or white board that has the schedule and important events for the day clearly listed to reference and cross off when tasks are complete.  Ask for written information, especially if it is new or unfamiliar (e.g., information provided at a doctor's appointment).  Create an organized environment. Keep items that can be easily misplaced in a sensible location and get into the habit of always returning the items to those places. Pay attention and reduce distractions. Make a point of focusing attention on information you want to remember. One-on-one interaction is more likely to facilitate attention and minimize distraction. Make eye contact and repeat the information out loud after you hear it. Reduce interruptions or distractions  especially when attempting to learn new information.  Create associations. When learning something new, think about and understand the information. Explain it in your own words or try to associate it with something you already know. Take notes to help remember important details. Evaluate goals and plan accordingly. When confronted by many different tasks, begin by making a list that prioritizes each task and estimates the time it will take to complete. Break down complicated tasks into smaller, more manageable steps. Focus on one task at a time and complete each task before starting another. Avoid multitasking.  DISPOSITION   Patient will follow up with the referring provider, Ms. Wertman. He should return for repeat neuropsychological testing in 12-18 months to monitor his course and assist with diagnosis and treatment planning. He will be provided verbal feedback in approximately one week regarding the findings and impression during this visit.  The remainder of the report includes the details of the patient's background and a table of results from the current evaluation, which support the summary and recommendations described above.  BACKGROUND   History of Presenting Illness: The following information was obtained from a review of medical records and an interview with the patient. Briefly, the patient was evaluated by Camie Sevin, PA-C, at [facility] on 02/24/2024 due to memory concerns primarily raised by the patient's family. MoCA = 25/30. He was referred for neuropsychological evaluation accordingly.  Cognitive Functioning: During today's appointment, the patient provided a report consistent with what he shared during his recent neurology visit, noting that it is primarily his wife and children who are concerned rather than himself. He was unable to identify a specific onset of cognitive decline and indicated that, although he has noticed some changes over time, they are generally mild  and possibly related to aging. He stated that his short-term memory has always been somewhat problematic, as he occasionally forgets where he placed items or has difficulty recalling recent conversations. However, he acknowledged that these lapses often occur when he is not interested in the topic being discussed or is not paying full attention. He noted that his wife frequently comments that he does not listen. He also reported experiencing occasional word-finding difficulties, such that he may recall the first letter of a desired word but not the full word. He otherwise denied significant concerns related to  processing speed, navigation, or executive functioning (e.g., planning, organizing, problem-solving)..  Physical Functioning: Patient denied difficulties with sleep initiation or maintenance. His record indicated a history of snoring and waking up fatigued, for which a sleep study had been recommended. He reported that he received the equipment to complete the study but chose not to proceed. He noted that fatigue has been less problematic recently. Appetite is stable, although he acknowledged that his diet is not always the healthiest; he does not perceive this as a significant change. He reported occasional choking episodes while eating. No changes in sense of taste or smell were reported. Vision remains stable following cataract surgery, though he occasionally uses glasses for distance. Hearing is stable. He denied balance problems, falls, and tremors.  Emotional Functioning: Patient described his recent mood as normal. Since starting lurasidone, he has noted improvement in his anger, acknowledging that he tends to have a temper. He denied suicidal ideation. He enjoys attending sports events, volunteering at his church, and exercising at the gym.  Neuroimaging: MRI of the brain is scheduled to be completed on 03/11/2024.  Other Relevant Medical History: Remarkable for coronary artery disease,  atherosclerosis of aorta, hypotension, hypothyroidism, hyperglycemia, thoracic degenerative disc disease, osteoarthritis of knee, gastroesophageal reflux disease, GERD, and glaucoma. Please refer to the medical record for a more comprehensive problem list. No history of stroke, CNS infection, head injury, or seizure was reported.  Current Medications: Per record, aspirin , brimonidine  tartrate-timolol , glucosamine-chondroitin-vitamin C-manganese, ketoconazole, levothyroxine , [place], omega-3 Krill oil, metformin , pantoprazole , rosuvastatin, lurasidone, and vitamin D3.   Functional Status: Patient independently performs all basic and instrumental (e.g., driving, finances, medications) activities of daily living, without reported difficulty. He acknowledged occasionally forgetting to pay the water  bill but noted that it is the only bill he receives by mail, whereas all others are sent by email, which he believes may contribute to it sometimes being overlooked. He denied any issues using household appliances.  Family Neurological History: Remarkable for unspecified dementia in the patient's mother and Alzheimer's dementia in multiple paternal aunts and uncles.  Psychiatric History: Patient was prescribed lurasidone by his primary care provider in the past few years for mood symptoms. He reported a history of feeling anger physically building up in his body, but since starting the medication, he has not experienced that sensation. He otherwise denied any history of depression, anxiety, prior mental health treatment, suicidal ideation, hallucinations, or psychiatric hospitalizations.  Substance Use History: Patient reported rare alcohol consumption. Current use of nicotine, marijuana, and other illicit substances was denied.  Social and Developmental History: Patient was born in [place], [place]. History of perinatal complications and developmental delays was not reported; of note, he reported that his head is  somewhat misshapen due to the manner in which he was delivered at birth. He is married and has two children, a son and a daughter. He currently lives with his wife and son.  Educational and Occupational History: No history of childhood learning disability, special education services, or grade retention was reported. Patient described himself as a good student who generally earned A and B grades, with occasional C grades. Although he performed well academically, he acknowledged that he was extremely shy, a trait he particularly noticed during college. He earned an associate degree in accounting. 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  BEHAVIORAL OBSERVATIONS   Patient arrived on time and was unaccompanied. He ambulated independently and  without gait disturbance. He was alert and fully oriented. He was appropriately groomed and dressed for the setting. No significant motor abnormalities were observed. Vision and hearing were adequate for testing purposes. Speech was of normal rate, prosody, and volume. No conversational word-finding difficulties, paraphasic errors, or dysarthria were observed. Comprehension was conversationally intact. Thought processes were linear, logical, and coherent. Thought content was organized and devoid of delusions. Insight appeared appropriate. Affect was congruent with euthymic mood, though at times he appeared to be anxious. He was cooperative and appeared to give adequate effort during testing, including on standalone and embedded measures of performance validity. Results are thought to accurately reflect his cognitive functioning at this time.  NEUROPSYCHOLOGICAL TESTING RESULTS   Tests Administered: Animal Naming Test; Brief Visuospatial Memory Test-Revised (BVMT-R) - Form 1; Controlled Oral Word Association Test (COWAT): FAS; Geriatric Anxiety Scale-10 Item  (GAS-10); Geriatric Depression Scale Short Form (GDS-SF); Hopkins Verbal Learning Test-Revised (HVLT-R) - From 1; Judgment of Line Orientation (JLO) - Form V; Neuropsychological Assessment Battery (NAB) - Subtest(s): Naming Form 1; Standalone performance validity test (PVT); Test of Premorbid Functioning (TOPF); Trail Making Test (TMT); Wechsler Adult Intelligence Scale Fifth Edition (WAIS-5) - Subtest(s): Similarities, Clinical Cytogeneticist, Matrix Reasoning, Digit Sequencing, Coding, Running Digits, Symbol Search, Symbol Span; Wechsler Memory Scale Fourth Edition (WMS-IV) - Subtest(s): Logical Memory (LM); and yes [place]  Card Sorting Test 64 Card Version (WCST-64).  Test results are provided in the table below. Whenever possible, the patient's scores were compared against age-, sex-, and education-corrected normative samples. Interpretive descriptions are based on the AACN consensus conference statement on uniform labeling (Guilmette et al., 2020).  PREMORBID FUNCTIONING RAW  RANGE  TOPF 58 StdS=115 High Average  ATTENTION & WORKING MEMORY RAW  RANGE  WAIS-5 Digit Sequencing -- ss=8 Average  WAIS-5 Running Digits -- ss=13 High Average  WAIS-5 Symbol Span -- ss=9 Average  PROCESSING SPEED RAW  RANGE  Trails A 40''0e T=42 Low Average  WAIS-5 Coding  -- ss=11 Average  WAIS-5 Symbol Search -- ss=12 High Average  EXECUTIVE FUNCTION RAW  RANGE  Trails B 111''1e T=41 Low Average  WAIS-5 Similarities -- ss=11 Average  COWAT Letter Fluency 16+12+22 T=61 High Average  WCST-64 Total Errors 12 T=55 Average  WCST-64 Perseverative Errors 5 T=59 High Average  WCST-64 Nonperseverative Errors 7 T=47 Average  WCST-64 Categories Completed 3 >16%ile WNL  WCSR-64 FMS 1 -- --  LANGUAGE RAW  RANGE  COWAT Letter Fluency 16+12+22 T=61 High Average  Animal Naming Test 11 T=31 Below Average  NAB Naming Test 28/31 T=37 WNL  VISUOSPATIAL RAW  RANGE  WAIS-5 Block Design -- ss=9 Average  JLO C/S=31/30 86+%ile WNL  BVMT-R  Copy Trial 12/12 -- WNL  VERBAL LEARNING & MEMORY RAW  RANGE  HVLT-R Learning Trials (3+5+6)/36 T=31 Below Average  HVLT-R Delayed Recall 1/12 T=22 Exceptionally Low  HVLT-R Percent Retained 17 T=15 Exceptionally Low  HVLT-R Recognition Hits 11 -- --  HVLT-R Recognition False Positives 4 -- --  HVLT-R Discrimination Index 7 T=31 Below Average  WMS-IV LM-I  (7+10+6)/53 ss=7 Low Average  WMS-IV LM-II  (1+2)/39 ss=3 Exceptionally Low  WMS-IV LM Recognition  (5+10)/23 3-9%ile Below Average  VISUAL LEARNING & MEMORY RAW  RANGE  BVMT-R Total Recall (1+1+2)/36 T=20 Exceptionally Low  BVMT-R Delayed Recall 0/12 T=20 Exceptionally Low  BVMT-R Percent Retained 0 <1%ile Exceptionally Low  BVMT-R Recognition Hits 6 >16%ile WNL  BVMT-R Recognition False Alarms 0 >16%ile WNL  BVMT-R Recognition Discrimination Index 6 >16%ile WNL  QUESTIONNAIRES RAW  RANGE  GDS-SF 1 -- Minimal  GAS-10 3 -- Minimal  *Note: ss = scaled score; StdS = standard score; T = t-score; C/S = corrected raw score; WNL = within normal limits; BNL= below normal limits; D/C = discontinued. Scores from skewed distributions are typically interpreted as WNL (>=16th %ile) or BNL (<16th %ile).   INFORMED CONSENT   Patient was provided with a verbal description of the nature and purpose of the neuropsychological evaluation. Also reviewed were the foreseeable risks and/or discomforts and benefits of the procedure, limits of confidentiality, and mandatory reporting requirements of this provider. Patient was given the opportunity to have their questions answered. Oral consent to participate was provided by the patient.   This report was prepared as part of a clinical evaluation and is not intended for forensic use.  SERVICE   This evaluation was conducted by Renda Beckwith, Psy.D. In addition to time spent directly with the patient, total professional time (180 minutes) includes record review, integration of relevant medical history, test  selection, interpretation of findings, and report preparation. A technician, Evalene Pizza, B.S., provided testing and scoring assistance (173 minutes).  Psychiatric Diagnostic Evaluation Services (Professional): 09208 x 1 Neuropsychological Testing Evaluation Services (Professional): 03867 x 1 Neuropsychological Testing Evaluation Services (Professional): 03866 x 2 Neuropsychological Test Administration and Scoring (Technician): [phone] x 1 Neuropsychological Test Administration and Scoring (Technician): [phone] x 5  This report was generated using voice recognition software. While this document has been carefully reviewed, transcription errors may be present. I apologize in advance for any inconvenience. Please contact me if further clarification is needed.            Renda Beckwith, Psy.D.             Neuropsychologist

## 2024-03-10 NOTE — Progress Notes (Signed)
   Psychometrician Note   Cognitive testing was administered to [employer] by Evalene Pizza, B.S. (psychometrist) under the supervision of Dr. Renda Beckwith, Psy.D., licensed psychologist on 03/10/2024. Jay Holmes did not appear overtly distressed by the testing session per behavioral observation or responses across self-report questionnaires. Rest breaks were offered.   The battery of tests administered was selected by Dr. Renda Beckwith, Psy.D. with consideration to Jay Holmes's current level of functioning, the nature of his symptoms, emotional and behavioral responses during interview, level of literacy, observed level of motivation/effort, and the nature of the referral question. This battery was communicated to the psychometrist. Communication between Dr. Renda Beckwith, Psy.D. and the psychometrist was ongoing throughout the evaluation and Dr. Renda Beckwith, Psy.D. was immediately accessible at all times. Dr. Renda Beckwith, Psy.D. provided supervision to the psychometrist on the date of this service to the extent necessary to assure the quality of all services provided.    Jay Holmes will return within approximately 1-2 weeks for an interactive feedback session with Dr. Beckwith at which time his test performances, clinical impressions, and treatment recommendations will be reviewed in detail. Jay Holmes understands he can contact our office should he require our assistance before this time.  A total of 173 minutes of billable time were spent face-to-face with Jay Holmes by the psychometrist. This includes both test administration and scoring time. Billing for these services is reflected in the clinical report generated by Dr. Renda Beckwith, Psy.D.  This note reflects time spent with the psychometrician and does not include test scores or any clinical interpretations made by Dr. Beckwith. The full report will follow in a separate note.

## 2024-03-11 ENCOUNTER — Ambulatory Visit
Admission: RE | Admit: 2024-03-11 | Discharge: 2024-03-11 | Disposition: A | Discharge status: Home / Self Care | Source: Ambulatory Visit | Attending: Physician Assistant | Admitting: Physician Assistant

## 2024-03-17 ENCOUNTER — Ambulatory Visit: Payer: Self-pay | Admitting: Psychology

## 2024-03-17 DIAGNOSIS — F0670 Mild neurocognitive disorder due to known physiological condition without behavioral disturbance: Secondary | ICD-10-CM | POA: Diagnosis not present

## 2024-03-17 NOTE — Progress Notes (Signed)
   NEUROPSYCHOLOGY FEEDBACK SESSION [place]. [facility]  [place] Department of Neurology  Date of Feedback Session: 03/17/2024  REASON FOR REFERRAL   Jay Holmes is a 68 year old, right-handed, White male with 14 years of formal education. He was referred for neuropsychological evaluation by Camie Sevin, PA-C, to assess current neurocognitive functioning, document potential cognitive deficits, and assist with treatment planning. This is his first neuropsychological evaluation.  FEEDBACK   Patient completed a comprehensive neuropsychological evaluation on 03/10/2024. Please refer to that encounter for the full report and recommendations. Briefly, results indicated deficits in semantic fluency and learning/memory. In the setting of preserved functional independence, findings support a diagnosis of mild neurocognitive disorder (mild cognitive impairment). In the absence of neuroimaging, the underlying etiology of these deficits is not fully clear. Although he reported test anxiety, intermittent mood symptoms, and possible untreated sleep apnea--which can certainly contribute to cognitive difficulties--the pattern of deficits observed is not necessarily characteristic of these factors alone and raises concern for an emerging neurodegenerative process. While memory difficulties may reflect low initial acquisition, the degree of forgetting and the inconsistent benefit from recognition cueing is generally unexpected.  Today, the patient was seen in person, accompanied by his wife over the phone. They were provided verbal feedback regarding the findings and impression during this visit, and their questions were answered. A copy of the report was provided at the conclusion of the visit.  DISPOSITION   Patient will follow up with the referring provider, Ms. Wertman. He should return for repeat neuropsychological testing in 12-18 months to monitor his course and assist with diagnosis and  treatment planning.  SERVICE   This feedback session was conducted by Renda Beckwith, Psy.D. One unit of 03867 (35 minutes) was billed for Dr. Beckwith' time spent in preparing, conducting, and documenting the current feedback session.  This report was generated using voice recognition software. While this document has been carefully reviewed, transcription errors may be present. I apologize in advance for any inconvenience. Please contact me if further clarification is needed.

## 2024-03-23 NOTE — Progress Notes (Signed)
 Assessment/Plan:    Mild cognitive impairment   Jay Holmes is a very pleasant 68 y.o. 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 Sleep issues and anxiety may be contributing to his symptoms although neurodegenerative disease cannot be ruled out (strong family of AD). Will proceed with biomarkers to determine the risk of developing AD. Discussed starting donepezil 10 mg daily  in an effort to slow down any cognitive decline, patient and wife agree. Patient is able to participate on ADLs and to drive without difficulties       Recommendations:   Follow up in 6  months. Repeat neuropsych evaluation in 10 to 16 months for diagnostic clarity and disease trajectory Start donepezil 10 mg daily as directed, side effects discussed Check biomarkers to determine the risk of developing AD Recommend good control of cardiovascular risk factors Continue to control mood as per PCP Recommend psychotherapy for emotional distress Patient to follow up on sleep issues with his PCP    Subjective:   This patient is accompanied in the office by his wife  who supplements the history. Previous records as well as any outside records available were reviewed prior to todays visit. Patient was last seen on 02/24/2024 with MoCA 25/30 .    Any changes in memory since last visit? About the same, continues to have STM difficulties.  Long-term memory is good.  He struggles keeping up with things on a daily basis, according to his wife repeats oneself?  Endorsed by his wife Disoriented when walking into a room?  Patient denies    Misplacing objects?  Patient denies   Wandering behavior?   Denies. Any personality changes since last visit?   As mentioned before, he may become argumentative, easily angry -wife says  Any worsening depression?: denies.   Hallucinations or  paranoia?  Denies.   Seizures?   Denies.    Any sleep changes? Does not sleep very well, per wife's report. Denies vivid dreams, REM behavior or sleepwalking   Sleep apnea?  His wife suspects that he has sleep apnea, he now agrees wo discuss with PCP regarding sleep studies.   Any hygiene concerns?   Denies.   Independent of bathing and dressing?  Endorsed  Does the patient needs help with medications? Patient is in charge denies missing doses. Keeps the medicines on the sink and flips them  when he takes them, that is my system, it works well.  Who is in charge of the finances?  Patient is in charge      Any changes in appetite?  denies     Patient have trouble swallowing?  He has GERD, to go on swallowing study soon. Does the patient cook? No  Any kitchen accidents such as leaving the stove on?   Denies.   Any headaches?    Denies.   Vision changes? Denies. Chronic pain?  Denies.   Ambulates with difficulty?    Denies.    Recent falls or head injuries?    Denies.      Unilateral weakness, numbness or tingling?  Denies.   Any tremors?  Denies.   Any anosmia?    Denies.   Any incontinence of urine?  Denies.   Any bowel dysfunction?  Denies.      Patient lives with wife  Does the patient drive?Yes, denies any issues  Neuropsych evaluation, 03/10/2024.  Dr.  Kdeiss briefly, results indicated deficits in semantic fluency and learning/memory. In the setting of preserved functional independence, findings support a diagnosis of mild neurocognitive disorder (mild cognitive impairment). In the absence of neuroimaging, the underlying etiology of these deficits is not fully clear. Although he reported test anxiety, intermittent mood symptoms, and possible untreated sleep apnea --which can certainly contribute to cognitive difficulties --the pattern of deficits observed is not necessarily characteristic of these factors alone and raises concern for an emerging neurodegenerative process. While memory  difficulties may reflect low initial acquisition, the degree of forgetting and the inconsistent benefit from recognition cueing is generally unexpected.  Initial visit 02/24/2024 How long did patient have memory difficulties?  They noticed more than I did. Wife reports that he may have some memory difficulties for the last 12 years, after retirement with  some difficulty remembering new information, conversations and names.  Long-term memory is good. Struggles keeping up with daily things according to her.  repeats oneself?  Endorsed by his wife Disoriented when walking into a room?  Denies  Leaving objects in unusual places? Denies.   Wandering behavior?  denies .  Any personality changes?  Denies.   Any history of depression?:  Never been diagnosed with depression or anxiety. He is difficult. Argumentative, easily angry worse recently. The retirement did not help.  Has become very dependent on me his wife says.  This has been witnessed during this visit, as they were in discord frequently. Hallucinations or paranoia?  Denies   Seizures?  Denies    Any sleep changes?  Does not sleep well, denies vivid dreams, REM behavior or sleepwalking   Sleep apnea? Refuses sleep studies, wife feels that he has sleep apnea. Any hygiene concerns?  Denies   Independent of bathing and dressing?  Endorsed  Does the patient needs help with medications? Patient is in charge. I keep them in the bathroom.   Who is in charge of the finances? Patient is in charge.  Had some issues with the water  bill-wife says, he disagrees.   Any changes in appetite?  Denies     Patient have trouble swallowing? Has reflux, to undergo studies soon.   Does the patient cook? No    Any kitchen accidents such as leaving the stove on? Denies.   Any history of headaches?   Denies.   Chronic pain ? Denies.   Ambulates with difficulty?  Denies. I was walking more, but since the dog is sick I do less, but I go to the gym.    Recent falls or head injuries? Denies.   Vision changes? Denies.   Any stroke like symptoms? Denies.   Any tremors?   Denies.   Any anosmia?  Denies.   Any incontinence of urine? Denies.   Any bowel dysfunction? Denies.      Patient lives with wife and son History of heavy alcohol intake? Denies.   History of heavy tobacco use? Denies.   Family history of dementia? Mother had dementia ?type  Does patient drive? Yes    retired from the budgeting for the [employer]   MRI of the brain 03/16/2024, personally reviewed without acute findings, no age advanced or lobar predominant volume loss pattern, normal low voids, sella is unremarkable, no masses or intracranial hemorrhages  Past Medical History:  Diagnosis Date   Allergy    Anxiety    Arthritis    Cataract    Diastasis recti 07/21/2012   questionable umbilical hernia   GERD (  gastroesophageal reflux disease)    Glaucoma    Hernia of abdominal wall    Personal history of diseases of skin and subcutaneous tissue    Personal history of urinary calculi    Unspecified hypothyroidism    Unspecified sinusitis (chronic)      Past Surgical History:  Procedure Laterality Date   Cataract surgery Bilateral    COLONOSCOPY      EYE SURGERY     JOINT REPLACEMENT     TONSILLECTOMY AND ADENOIDECTOMY     TOTAL KNEE ARTHROPLASTY Right 05/03/2018   Procedure: TOTAL KNEE ARTHROPLASTY;  Surgeon: Melodi Lerner, MD;  Location: [facility];  Service: Orthopedics;  Laterality: Right;     PREVIOUS MEDICATIONS:   CURRENT MEDICATIONS:  Outpatient Encounter Medications as of 03/24/2024  Medication Sig   aspirin  EC 81 MG tablet Take 1 tablet (81 mg total) by mouth daily.   Brimonidine  Tartrate-Timolol  (COMBIGAN  OP) Apply 5 mLs to eye.   doxycycline  (MONODOX ) 100 MG capsule Take 100 mg by mouth daily. (Patient not taking: Reported on 02/24/2024)   Glucosamine-Chondroit-Vit C-Mn (GLUCOSAMINE CHONDR 1500 COMPLX PO) Take by mouth. Chondroitin  1200mg  2 tablets daily   ketoconazole (NIZORAL) 2 % cream Apply 1 application  topically daily as needed (rosacea).   levothyroxine  (SYNTHROID ) 100 MCG tablet Take 1 tablet (100 mcg total) by mouth daily.   LUMIGAN 0.01 % SOLN SMARTSIG:In Eye(s)   MegaRed Omega-3 Krill Oil 500 MG CAPS Take 1 capsule by mouth every morning.   metFORMIN  (GLUCOPHAGE -XR) 500 MG 24 hr tablet Take 1 tablet (500 mg total) by mouth daily with supper.   pantoprazole  (PROTONIX ) 40 MG tablet Take 1 tablet (40 mg total) by mouth daily.   rosuvastatin (CRESTOR) 5 MG tablet Take 1 tablet (5 mg total) by mouth daily.   Vilazodone  HCl 20 MG TABS Take 1 tablet (20 mg total) by mouth daily.   VITAMIN D , CHOLECALCIFEROL, PO Take by mouth. OTC   No facility-administered encounter medications on file as of 03/24/2024.     Objective:     PHYSICAL EXAMINATION:    VITALS:   Vitals:   03/24/24 1132  BP: 115/74  Pulse: 86  Resp: 20  Height: 5' 8.5 (1.74 m)    GEN:  The patient appears stated age and is in NAD. HEENT:  Normocephalic, atraumatic.   Neurological examination:  General: NAD, well-groomed, appears stated age. Orientation: The patient is alert. Oriented to person, place and to date. Cranial nerves: There is good facial symmetry.Anxious appearing.The speech is fluent and clear. No aphasia or dysarthria. Fund of knowledge is appropriate. Recent memory impaired and remote memory is normal.  Attention and concentration are normal.  Able to name objects and repeat phrases.  Hearing is intact to conversational tone .   Sensation: Sensation is intact to light touch throughout Motor: Strength is at least antigravity x4. DTR's 2/4 in UE/LE      02/24/2024    8:00 AM  Montreal Cognitive Assessment   Visuospatial/ Executive (0/5) 5  Naming (0/3) 3  Attention: Read list of digits (0/2) 2  Attention: Read list of letters (0/1) 1  Attention: Serial 7 subtraction starting at 100 (0/3) 3  Language: Repeat phrase  (0/2) 2  Language : Fluency (0/1) 1  Abstraction (0/2) 2  Delayed Recall (0/5) 1  Orientation (0/6) 5  Total 25  Adjusted Score (based on education) 25        No data to display  Movement examination: Tone: There is normal tone in the UE/LE Abnormal movements:  no tremor.  No myoclonus.  No asterixis.   Coordination:  There is no decremation with RAM's. Normal FTN Gait and Station: The patient has no difficulty arising out of a deep-seated chair without the use of the hands. The patient's stride length is good.  Gait is cautious and narrow.   Thank you for allowing us  the opportunity to participate in the care of this nice patient. Please do not hesitate to contact us  for any questions or concerns.   Total time spent on today's visit was 37 minutes dedicated to this patient today, preparing to see patient, examining the patient, ordering tests and/or medications and counseling the patient, documenting clinical information in the EHR or other health record, independently interpreting results and communicating results to the patient/family, discussing treatment and goals, answering patient's questions and coordinating care.  Cc:  Plotnikov, Karlynn GAILS, MD  Camie Sevin 03/24/2024 11:50 AM

## 2024-03-24 ENCOUNTER — Encounter: Payer: Self-pay | Admitting: Physician Assistant

## 2024-03-24 ENCOUNTER — Ambulatory Visit (INDEPENDENT_AMBULATORY_CARE_PROVIDER_SITE_OTHER): Admitting: Physician Assistant

## 2024-03-24 ENCOUNTER — Other Ambulatory Visit

## 2024-03-24 VITALS — BP 115/74 | HR 86 | Resp 20 | Ht 68.5 in

## 2024-03-24 DIAGNOSIS — R413 Other amnesia: Secondary | ICD-10-CM | POA: Diagnosis not present

## 2024-03-24 DIAGNOSIS — R4189 Other symptoms and signs involving cognitive functions and awareness: Secondary | ICD-10-CM | POA: Diagnosis not present

## 2024-03-24 DIAGNOSIS — F0670 Mild neurocognitive disorder due to known physiological condition without behavioral disturbance: Secondary | ICD-10-CM | POA: Diagnosis not present

## 2024-03-24 MED ORDER — DONEPEZIL HCL 10 MG PO TABS: ORAL_TABLET | ORAL | 3 refills | Status: AC

## 2024-03-24 NOTE — Patient Instructions (Addendum)
 Start Donepezil 10 mg :Take half tablet (5 mg) daily for 2 weeks, then increase to the full tablet at 10 mg daily.   Blood test Follow up May 22 at 11:30  Follow up on sleep apnea Follow up on the mood issues with primary doctor

## 2024-04-11 ENCOUNTER — Ambulatory Visit (INDEPENDENT_AMBULATORY_CARE_PROVIDER_SITE_OTHER): Payer: Self-pay | Admitting: Adult Health

## 2024-04-11 ENCOUNTER — Telehealth (INDEPENDENT_AMBULATORY_CARE_PROVIDER_SITE_OTHER): Payer: Self-pay | Admitting: Nurse Practitioner

## 2024-04-11 ENCOUNTER — Encounter (INDEPENDENT_AMBULATORY_CARE_PROVIDER_SITE_OTHER): Payer: Self-pay | Admitting: Adult Health

## 2024-04-11 ENCOUNTER — Telehealth (INDEPENDENT_AMBULATORY_CARE_PROVIDER_SITE_OTHER): Payer: Self-pay | Admitting: Adult Health

## 2024-04-11 VITALS — BP 121/74 | HR 76 | Temp 98.7°F | Ht 68.0 in | Wt 217.0 lb

## 2024-04-11 DIAGNOSIS — F411 Generalized anxiety disorder: Secondary | ICD-10-CM

## 2024-04-11 DIAGNOSIS — E559 Vitamin D deficiency, unspecified: Secondary | ICD-10-CM | POA: Diagnosis not present

## 2024-04-11 DIAGNOSIS — G3184 Mild cognitive impairment, so stated: Secondary | ICD-10-CM | POA: Diagnosis not present

## 2024-04-11 DIAGNOSIS — Z6832 Body mass index (BMI) 32.0-32.9, adult: Secondary | ICD-10-CM

## 2024-04-11 DIAGNOSIS — R7303 Prediabetes: Secondary | ICD-10-CM

## 2024-04-11 DIAGNOSIS — E669 Obesity, unspecified: Secondary | ICD-10-CM

## 2024-04-11 DIAGNOSIS — Z6833 Body mass index (BMI) 33.0-33.9, adult: Secondary | ICD-10-CM

## 2024-04-11 MED ORDER — METFORMIN HCL ER 500 MG PO TB24: 500.0000 mg | ORAL_TABLET | Freq: Every day | ORAL | 0 refills | Status: DC

## 2024-04-11 NOTE — Telephone Encounter (Signed)
 Called about the same thing

## 2024-04-11 NOTE — Telephone Encounter (Signed)
 Pt was seen today and pt wanted wife to be conference  in on the appt but it did not happen and pts wife is concerned about it. She did not understand why the provider would not let this happen and she would like a call from the provider to discuss pts visit.

## 2024-04-11 NOTE — Telephone Encounter (Signed)
 Left message to call back.

## 2024-04-11 NOTE — Progress Notes (Addendum)
 WEIGHT SUMMARY AND BIOMETRICS  Vitals Temp: 98.7 F (37.1 C) BP: 121/74 Pulse Rate: 76 SpO2: 99 %   Anthropometric Measurements Holmes: 5' 8 (1.727 m) Weight: 217 lb (98.4 kg) BMI (Calculated): 33 Weight at Last Visit: 214lb Weight Lost Since Last Visit: 0lb Weight Gained Since Last Visit: 3lb Starting Weight: 213lb Total Weight Loss (lbs): 0 lb (0 kg) Peak Weight: 217lb   Body Composition  Body Fat %: 33.2 % Fat Mass (lbs): 72.2 lbs Muscle Mass (lbs): 138 lbs Total Body Water  (lbs): 106.4 lbs Visceral Fat Rating : 20   Other Clinical Data RMR: [id number] Fasting: yes Labs: yes Today's Visit #: 9 Starting Date: 11/26/22    Chief Complaint:   OBESITY Jay Holmes is here to discuss his progress with his obesity treatment plan.  He is on the practicing portion control and making smarter food choices, such as increasing vegetables and decreasing simple carbohydrates and states he is following his eating plan approximately 70 % of the time.  He states he is exercising Walking and Strength Training 60 minutes 4 times per week. He attends [employer].   Interim History:  Last OV at [facility] was 9/23/20025- recommeded 4 week f/u  Labs completed with PCP and Neurology- reviewed in EPIC Per pt- his mother had AD He started daily Aricept - titrated up to daily dose of 10mg  He is unsure if this has improved his memory/neurological fxing  EXAM: MRI BRAIN WITHOUT CONTRAST 03/11/2024 05:29:58 PM   TECHNIQUE: Multiplanar multisequence MRI of the head/brain was performed without the administration of intravenous contrast.   COMPARISON: None available.   CLINICAL HISTORY: Memory impairment for 2-3 years.   FINDINGS:   BRAIN AND VENTRICLES: No acute infarct. No intracranial hemorrhage. No mass. No midline shift. No hydrocephalus. The sella is unremarkable. Normal flow voids.   ORBITS: No acute abnormality.   SINUSES AND MASTOIDS: No acute abnormality.   BONES  AND SOFT TISSUES: Normal marrow signal. No acute soft tissue abnormality.   IMPRESSION: 1. No acute intracranial abnormality. 2. No age-advanced or lobar-predominant volume loss pattern.  Wife will make most meals and provides limited portion for dinner serving.  He makes his own breakfast and lunch.  Breakfast: Grits or Cereal or 2 pieces of toast Lunch: Crackers, or eat out with friends  Of Note- He attempted to call his wife twice during OV- unable to connect Encouraged wife to send MyChart with any questions/concerns  04/11/2024 1640- Called and spoke with pt's wife, Devere Holmes Patient [place] [employer] Release  Patient states it is ok to speak to [name]. See DPR on file.     Verified Jay Holmes's identity and provided detailed information from today's OV with Mr. Holmes- all questions/concerns answered.  Subjective:   1. Prediabetes Lab Results  Component Value Date   HGBA1C 6.1 08/11/2023   HGBA1C 6.3 (H) 11/26/2022   HGBA1C 6.3 02/20/2022    02/02/2024 - plain Metformin  500mg  replaced with XR formulation He denies GI upset He is unsure if he has been consistently taking the Metfimin XR 500mg  Pt provided [facility] pill box to help with medication compliance  2. Mild cognitive impairment 02/24/2024 Neurology OV Notes Assessment/Plan:    Jay Holmes is a very pleasant 68 y.o. year old RH male with a history of hypothyroidism, GERD, CAD, vit D deficiency seen today for evaluation of memory loss. MoCA today is 25/30. Etiology is unclear, workup is in progress. A component of depression and anxiety  may be present.  Patient is able to participate on ADLs   Patient continues to drive without significant difficulties.       Memory Impairment of unclear etiology   MRI brain with and without contrast to assess for underlying structural abnormality and assess vascular load  Neurocognitive testing to further evaluate cognitive concerns and determine other  underlying cause of memory changes, including potential contribution from sleep, anxiety, attention, or depression  Check B12 , D Continue to control mood as per PCP, recommend psychotherapy for emotional distress  Recommend good control of cardiovascular risk factors Folllow up pending on the above results  EXAM: MRI BRAIN WITHOUT CONTRAST 03/11/2024 05:29:58 PM   TECHNIQUE: Multiplanar multisequence MRI of the head/brain was performed without the administration of intravenous contrast.   COMPARISON: None available.   CLINICAL HISTORY: Memory impairment for 2-3 years.   FINDINGS:   BRAIN AND VENTRICLES: No acute infarct. No intracranial hemorrhage. No mass. No midline shift. No hydrocephalus. The sella is unremarkable. Normal flow voids.   ORBITS: No acute abnormality.   SINUSES AND MASTOIDS: No acute abnormality.   BONES AND SOFT TISSUES: Normal marrow signal. No acute soft tissue abnormality.   IMPRESSION: 1. No acute intracranial abnormality. 2. No age-advanced or lobar-predominant volume loss pattern.  Wife will make most meals and provides limited portion for dinner serving.  He makes his own breakfast and lunch.  Breakfast: Grits or Cereal or 2 pieces of toast Lunch: Crackers, or eat out with friends  3. Vitamin D  deficiency Discussed Labs  Latest Reference Range & Units 02/24/24 09:02  Vitamin D , 25-Hydroxy 30 - 100 ng/mL 39   Level stable He is on daily OTC Vit D 3- unsure of dosage   Assessment/Plan:   1. Prediabetes Refill  metFORMIN  (GLUCOPHAGE -XR) 500 MG 24 hr tablet Take 1 tablet (500 mg total) by mouth daily with supper. Dispense: 30 tablet, Refills: 0 ordered   Check Labs -A1c -Insulin  RANDOM  2. Mild cognitive impairment Continue Aricept  per Neurology F/u with specialist as directed and as needed.  3. Vitamin D  deficiency (Primary) Confirm dosage at next OV  4. BMI 32.0-32.9,adult - current BMI 33.1  Jay Holmes is not currently in  the action stage of change. As such, his goal is to get back to weightloss efforts . He has agreed to practicing portion control and making smarter food choices, such as increasing vegetables and decreasing simple carbohydrates.   Exercise goals: Older adults should follow the adult guidelines. When older adults cannot meet the adult guidelines, they should be as physically active as their abilities and conditions will allow.  Older adults should do exercises that maintain or improve balance if they are at risk of falling.  Older adults should determine their level of effort for physical activity relative to their level of fitness.  Older adults with chronic conditions should understand whether and how their conditions affect their ability to do regular physical activity safely.  Behavioral modification strategies: increasing lean protein intake, decreasing simple carbohydrates, increasing vegetables, increasing water  intake, decreasing liquid calories, decreasing sodium intake, increasing high fiber foods, meal planning and cooking strategies, keeping healthy foods in the home, ways to avoid boredom eating, better snacking choices, and planning for success.  Handouts: - Smart Fruit - Breakfast Options - Meal Planning Tips - Lunch Ideas  [facility] Pill Box provided to pt to assist with medication adherence   Strive for least 30g protein per meal.  Monish has agreed to follow-up with our clinic  in 4 weeks. He was informed of the importance of frequent follow-up visits to maximize his success with intensive lifestyle modifications for his multiple health conditions.   Roper was informed we would discuss his lab results at his next visit unless there is a critical issue that needs to be addressed sooner. Derik agreed to keep his next visit at the agreed upon time to discuss these results.  Objective:   Blood pressure 121/74, pulse 76, temperature 98.7 F (37.1 C), Holmes 5' 8 (1.727 m), weight 217  lb (98.4 kg), SpO2 99%. Body mass index is 32.99 kg/m.  General: Cooperative, alert, well developed, in no acute distress. HEENT: Conjunctivae and lids unremarkable. Cardiovascular: Regular rhythm.  Lungs: Normal work of breathing. Neurologic: No focal deficits.   Lab Results  Component Value Date   CREATININE 1.00 02/15/2024   BUN 17 02/15/2024   NA 142 02/15/2024   K 4.1 02/15/2024   CL 104 02/15/2024   CO2 23 02/15/2024   Lab Results  Component Value Date   ALT 32 02/15/2024   AST 23 02/15/2024   ALKPHOS 111 02/15/2024   BILITOT 0.8 02/15/2024   Lab Results  Component Value Date   HGBA1C 6.1 08/11/2023   HGBA1C 6.3 (H) 11/26/2022   HGBA1C 6.3 02/20/2022   HGBA1C 6.2 02/07/2021   HGBA1C 6.1 06/26/2020   Lab Results  Component Value Date   INSULIN  25.6 (H) 11/26/2022   Lab Results  Component Value Date   TSH 4.57 02/15/2024   Lab Results  Component Value Date   CHOL 136 02/15/2024   HDL 40.50 02/15/2024   LDLCALC 73 02/15/2024   LDLDIRECT 74.0 02/07/2021   TRIG 115.0 02/15/2024   CHOLHDL 3 02/15/2024   Lab Results  Component Value Date   VD25OH 39 02/24/2024   VD25OH 30.3 11/26/2022   VD25OH 40.27 10/12/2019   Lab Results  Component Value Date   WBC 5.2 02/15/2024   HGB 15.5 02/15/2024   HCT 46.0 02/15/2024   MCV 93.1 02/15/2024   PLT 190.0 02/15/2024   No results found for: IRON, TIBC, FERRITIN  Attestation Statements:   Reviewed by clinician on day of visit: allergies, medications, problem list, medical history, surgical history, family history, social history, and previous encounter notes.  I have reviewed the above documentation for accuracy and completeness, and I agree with the above. -  Samia Kukla d. Broady Lafoy, NP-C

## 2024-04-11 NOTE — Telephone Encounter (Signed)
 Pt's wife called and updated on details of [facility] OV

## 2024-04-12 ENCOUNTER — Telehealth: Payer: Self-pay

## 2024-04-12 LAB — INSULIN, RANDOM: INSULIN: 16.6 u[IU]/mL (ref 2.6–24.9)

## 2024-04-12 LAB — HEMOGLOBIN A1C
Est. average glucose Bld gHb Est-mCnc: 131 mg/dL
Hgb A1c MFr Bld: 6.2 % — ABNORMAL HIGH (ref 4.8–5.6)

## 2024-04-12 NOTE — Telephone Encounter (Signed)
 Hemoglobin A1c is in the  prediabetic range.  He needs to cut back on sugars and starches, exercise regular, lose a few pounds.  Keep your return office visit.  Thanks

## 2024-04-12 NOTE — Telephone Encounter (Signed)
 Copied from CRM #[id number]. Topic: Clinical - Medical Advice >> Apr 12, 2024 11:26 AM Vena HERO wrote: Reason for CRM: Pt got his test results back yesterday and sees that his blood sugar is high. He would like to know what next steps he needs to take such as a follow up visit or medication. Please call pt to advise

## 2024-04-13 NOTE — Telephone Encounter (Signed)
 Spoke with pt and was able to inform him of PCP advice. Pt has seen a [facility]ist with [facility] and has been put on a diet plan and started to go to the gym as well.

## 2024-05-11 ENCOUNTER — Ambulatory Visit (INDEPENDENT_AMBULATORY_CARE_PROVIDER_SITE_OTHER): Admitting: Adult Health

## 2024-05-11 ENCOUNTER — Encounter (INDEPENDENT_AMBULATORY_CARE_PROVIDER_SITE_OTHER): Payer: Self-pay | Admitting: Adult Health

## 2024-05-11 VITALS — BP 132/82 | HR 85 | Temp 97.8°F | Ht 68.0 in | Wt 214.0 lb

## 2024-05-11 DIAGNOSIS — E559 Vitamin D deficiency, unspecified: Secondary | ICD-10-CM

## 2024-05-11 DIAGNOSIS — E669 Obesity, unspecified: Secondary | ICD-10-CM | POA: Diagnosis not present

## 2024-05-11 DIAGNOSIS — R7303 Prediabetes: Secondary | ICD-10-CM | POA: Diagnosis not present

## 2024-05-11 DIAGNOSIS — Z6832 Body mass index (BMI) 32.0-32.9, adult: Secondary | ICD-10-CM

## 2024-05-11 DIAGNOSIS — M542 Cervicalgia: Secondary | ICD-10-CM

## 2024-05-11 LAB — P-TAU217/BETA-AMYLOID 1-42 RATIO, PLASMA

## 2024-05-11 MED ORDER — METFORMIN HCL ER 500 MG PO TB24: 500.0000 mg | ORAL_TABLET | Freq: Two times a day (BID) | ORAL | 0 refills | Status: AC

## 2024-05-11 NOTE — Progress Notes (Signed)
 "    WEIGHT SUMMARY AND BIOMETRICS  Vitals Temp: 97.8 F (36.6 C) BP: 132/82 Pulse Rate: 85 SpO2: 98 %   Anthropometric Measurements Height: 5' 8 (1.727 m) Weight: 214 lb (97.1 kg) BMI (Calculated): 32.55 Weight at Last Visit: 217lb Weight Lost Since Last Visit: 2lb Weight Gained Since Last Visit: 0lb Starting Weight: 213lb Total Weight Loss (lbs): 0 lb (0 kg) Peak Weight: 217lb   Body Composition  Body Fat %: 32.7 % Fat Mass (lbs): 70.2 lbs Muscle Mass (lbs): 137.4 lbs Total Body Water  (lbs): 104.2 lbs Visceral Fat Rating : 20   Other Clinical Data RMR: [id number] Fasting: No Labs: No Today's Visit #: 10 Starting Date: 11/26/22    Chief Complaint:   OBESITY Jay Holmes is here to discuss his progress with his obesity treatment plan.  He is on the practicing portion control and making smarter food choices, such as increasing vegetables and decreasing simple carbohydrates and states he is following his eating plan approximately 50 % of the time.  He states he is exercising Gym 60 minutes 4-5 times per week.  Interim History:  His gym routine: Treadmill, seated elliptical, and strength training via Nautilus machines. Total routine accomplished in 60 mins  He has been reducing portion sizes at meals, specifically keeping to ONE serving. His wife provides nutritious meals. He has been trying to limit snacking on higher sugar/simple CHO, ie: crackers and M&Ms  Hydration-he estimates to drink at least 32 oz water /day  Of note- His wife, Jay Holmes, attended OV via speaker phone. Jay Holmes is listed as DPR on his record  Subjective:   1. Neck pain [facility] OV Notes 04/19/2024 04/19/2024 Acute atraumatic cervicalgia without radiculopathy - Suspect left trapezius/rhomboid muscular strain/spasm - Severe degenerative changes of the cervical spine   Plan: - Discontinue ibuprofen  and other NSAIDs - Start meloxicam  7.5 mg daily with food for inflammation - Start  OTC Tylenol  500 mg 3 times daily for inflammation - Warm compresses/RICE therapy - Tizanidine 2 mg twice daily for muscle spasms - Trigger point injection for the left trapezius ordered, awaiting insurance approval   Follow-up: - Sports medicine for possible trigger point injection   2. Prediabetes Discussed Labs  Latest Reference Range & Units 04/11/24 10:39  Hemoglobin A1C 4.8 - 5.6 % 6.2 (H)  Est. average glucose Bld gHb Est-mCnc mg/dL 868  INSULIN  2.6 - 24.9 uIU/mL 16.6  (H): Data is abnormally high  A1c and insulin  levels both improved, however both are still above goal  He is currently on Metformin  XR 500mg - takes with dinner He denies GI upset Discussed risks/benefits of increasing Metformin  XR 500mg - he is agreeable His father had T2D His father passed at age 30 from complications of Prostate Ca  3. Vitamin D  deficiency  Latest Reference Range & Units 11/26/22 14:27 02/24/24 09:02  Vitamin D , 25-Hydroxy 30 - 100 ng/mL 30.3 39   Vit D Level slightly improved  Assessment/Plan:   1. Neck pain (Primary) F/u with [facility] as directed  2. Prediabetes Refill and INCREASE - metFORMIN  (GLUCOPHAGE -XR) 500 MG 24 hr tablet; Take 1 tablet (500 mg total) by mouth 2 (two) times daily with a meal.  Dispense: 180 tablet; Refill: 0  3. Vitamin D  deficiency Monitor Labs  4. BMI 32.0-32.9,adult - current BMI 32.7  Jay Holmes is currently in the action stage of change. As such, his goal is to continue with weight loss efforts. He has agreed to practicing portion control and making smarter food choices, such  as increasing vegetables and decreasing simple carbohydrates.   Exercise goals: Older adults should follow the adult guidelines. When older adults cannot meet the adult guidelines, they should be as physically active as their abilities and conditions will allow.  Older adults should do exercises that maintain or improve balance if they are at risk of falling.  Older adults  should determine their level of effort for physical activity relative to their level of fitness.  Older adults with chronic conditions should understand whether and how their conditions affect their ability to do regular physical activity safely.  Behavioral modification strategies: increasing lean protein intake, decreasing simple carbohydrates, increasing vegetables, increasing water  intake, decreasing liquid calories, decreasing sodium intake, meal planning and cooking strategies, keeping healthy foods in the home, ways to avoid boredom eating, ways to avoid night time snacking, better snacking choices, planning for success, and decreasing junk food.  Jay Holmes has agreed to follow-up with our clinic in 4 weeks. He was informed of the importance of frequent follow-up visits to maximize his success with intensive lifestyle modifications for his multiple health conditions.   Check BMP 8 weeks, re: increased Metformin  dose  Objective:   Blood pressure 132/82, pulse 85, temperature 97.8 F (36.6 C), height 5' 8 (1.727 m), weight 214 lb (97.1 kg), SpO2 98%. Body mass index is 32.54 kg/m.  General: Cooperative, alert, well developed, in no acute distress. HEENT: Conjunctivae and lids unremarkable. Cardiovascular: Regular rhythm.  Lungs: Normal work of breathing. Neurologic: No focal deficits.   Lab Results  Component Value Date   CREATININE 1.00 02/15/2024   BUN 17 02/15/2024   NA 142 02/15/2024   K 4.1 02/15/2024   CL 104 02/15/2024   CO2 23 02/15/2024   Lab Results  Component Value Date   ALT 32 02/15/2024   AST 23 02/15/2024   ALKPHOS 111 02/15/2024   BILITOT 0.8 02/15/2024   Lab Results  Component Value Date   HGBA1C 6.2 (H) 04/11/2024   HGBA1C 6.1 08/11/2023   HGBA1C 6.3 (H) 11/26/2022   HGBA1C 6.3 02/20/2022   HGBA1C 6.2 02/07/2021   Lab Results  Component Value Date   INSULIN  16.6 04/11/2024   INSULIN  25.6 (H) 11/26/2022   Lab Results  Component Value Date   TSH  4.57 02/15/2024   Lab Results  Component Value Date   CHOL 136 02/15/2024   HDL 40.50 02/15/2024   LDLCALC 73 02/15/2024   LDLDIRECT 74.0 02/07/2021   TRIG 115.0 02/15/2024   CHOLHDL 3 02/15/2024   Lab Results  Component Value Date   VD25OH 39 02/24/2024   VD25OH 30.3 11/26/2022   VD25OH 40.27 10/12/2019   Lab Results  Component Value Date   WBC 5.2 02/15/2024   HGB 15.5 02/15/2024   HCT 46.0 02/15/2024   MCV 93.1 02/15/2024   PLT 190.0 02/15/2024   No results found for: IRON, TIBC, FERRITIN  Attestation Statements:   Reviewed by clinician on day of visit: allergies, medications, problem list, medical history, surgical history, family history, social history, and previous encounter notes.  I have reviewed the above documentation for accuracy and completeness, and I agree with the above. -  Pedrohenrique [place] d. Aragon Scarantino, NP-C "

## 2024-05-16 LAB — P-TAU217/BETA-AMYLOID 1-42 RATIO, PLASMA
PTAU217/ABETA 42 RATIO: 23 pg/mL
PTAU217: 0.091 pg/mL

## 2024-06-09 ENCOUNTER — Ambulatory Visit (INDEPENDENT_AMBULATORY_CARE_PROVIDER_SITE_OTHER): Admitting: Adult Health

## 2024-06-09 ENCOUNTER — Encounter (INDEPENDENT_AMBULATORY_CARE_PROVIDER_SITE_OTHER): Payer: Self-pay | Admitting: Adult Health

## 2024-06-09 VITALS — BP 102/66 | HR 74 | Temp 97.8°F | Ht 68.0 in | Wt 214.0 lb

## 2024-06-09 DIAGNOSIS — E559 Vitamin D deficiency, unspecified: Secondary | ICD-10-CM

## 2024-06-09 DIAGNOSIS — F5089 Other specified eating disorder: Secondary | ICD-10-CM

## 2024-06-09 DIAGNOSIS — R7303 Prediabetes: Secondary | ICD-10-CM

## 2024-06-09 DIAGNOSIS — Z0000 Encounter for general adult medical examination without abnormal findings: Secondary | ICD-10-CM

## 2024-06-09 DIAGNOSIS — Z6832 Body mass index (BMI) 32.0-32.9, adult: Secondary | ICD-10-CM

## 2024-06-09 DIAGNOSIS — F411 Generalized anxiety disorder: Secondary | ICD-10-CM | POA: Diagnosis not present
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
## 2024-06-13 ENCOUNTER — Ambulatory Visit (INDEPENDENT_AMBULATORY_CARE_PROVIDER_SITE_OTHER): Admitting: Adult Health

## 2024-06-30 ENCOUNTER — Ambulatory Visit (INDEPENDENT_AMBULATORY_CARE_PROVIDER_SITE_OTHER): Admitting: Family Medicine

## 2024-07-27 ENCOUNTER — Ambulatory Visit

## 2024-07-28 ENCOUNTER — Ambulatory Visit

## 2024-08-15 ENCOUNTER — Ambulatory Visit: Admitting: Internal Medicine

## 2024-09-26 ENCOUNTER — Ambulatory Visit: Admitting: Physician Assistant

## 2025-03-21 ENCOUNTER — Ambulatory Visit: Payer: Self-pay

## 2025-03-21 ENCOUNTER — Institutional Professional Consult (permissible substitution): Admitting: Psychology

## 2025-03-28 ENCOUNTER — Encounter: Admitting: Psychology
